# Patient Record
Sex: Female | Born: 1985
Health system: Southern US, Community
[De-identification: ages and names within clinical notes are randomized; demographics above are authoritative.]

## PROBLEM LIST (undated history)

## (undated) ENCOUNTER — Inpatient Hospital Stay (HOSPITAL_COMMUNITY): Payer: Self-pay

## (undated) ENCOUNTER — Inpatient Hospital Stay (HOSPITAL_COMMUNITY): Admission: RE | Payer: Self-pay | Source: Ambulatory Visit

## (undated) DIAGNOSIS — R51 Headache: Secondary | ICD-10-CM

## (undated) DIAGNOSIS — O24419 Gestational diabetes mellitus in pregnancy, unspecified control: Secondary | ICD-10-CM

## (undated) DIAGNOSIS — F419 Anxiety disorder, unspecified: Secondary | ICD-10-CM

---

## 2007-11-20 HISTORY — PX: TONSILLECTOMY: SUR1361

## 2012-08-18 ENCOUNTER — Emergency Department (HOSPITAL_COMMUNITY): Payer: Self-pay

## 2012-08-18 ENCOUNTER — Emergency Department (HOSPITAL_COMMUNITY)
Admission: EM | Admit: 2012-08-18 | Discharge: 2012-08-18 | Disposition: A | Discharge status: Home / Self Care | Payer: Self-pay | Attending: Emergency Medicine | Admitting: Emergency Medicine

## 2012-08-18 ENCOUNTER — Encounter (HOSPITAL_COMMUNITY): Payer: Self-pay | Admitting: *Deleted

## 2012-08-18 DIAGNOSIS — F411 Generalized anxiety disorder: Secondary | ICD-10-CM | POA: Insufficient documentation

## 2012-08-18 DIAGNOSIS — F410 Panic disorder [episodic paroxysmal anxiety] without agoraphobia: Secondary | ICD-10-CM

## 2012-08-18 LAB — POCT I-STAT, CHEM 8
BUN: 7 mg/dL (ref 6–23)
Calcium, Ion: 1.16 mmol/L (ref 1.12–1.23)
Creatinine, Ser: 0.7 mg/dL (ref 0.50–1.10)
Glucose, Bld: 95 mg/dL (ref 70–99)
Hemoglobin: 13.9 g/dL (ref 12.0–15.0)
Sodium: 140 mEq/L (ref 135–145)
TCO2: 25 mmol/L (ref 0–100)

## 2012-08-18 LAB — CBC
HCT: 39.7 % (ref 36.0–46.0)
MCH: 28.4 pg (ref 26.0–34.0)
MCHC: 33.8 g/dL (ref 30.0–36.0)
MCV: 84.1 fL (ref 78.0–100.0)
Platelets: 244 10*3/uL (ref 150–400)
RDW: 12.5 % (ref 11.5–15.5)
WBC: 6.1 10*3/uL (ref 4.0–10.5)

## 2012-08-18 MED ORDER — LORAZEPAM 1 MG PO TABS
0.5000 mg | ORAL_TABLET | Freq: Once | ORAL | Status: AC
  Administered 2012-08-18: 22:00:00 via ORAL
  Filled 2012-08-18: qty 1

## 2012-08-18 MED ORDER — LORAZEPAM 1 MG PO TABS: 0.5000 mg | ORAL_TABLET | Freq: Three times a day (TID) | ORAL | Status: DC | PRN

## 2012-08-18 NOTE — ED Notes (Signed)
New EKG given to Dr Effie Shy. No old EKG.

## 2012-08-18 NOTE — ED Provider Notes (Signed)
History     CSN: [id number]  Arrival date & time 08/18/12  2055   First MD Initiated Contact with Patient 08/18/12 2151      Chief Complaint  Patient presents with  . Chest Pain    (Consider location/radiation/quality/duration/timing/severity/associated sxs/prior treatment) HPI  pts first language is Arabic.  She is here with her husband and friend both who speak both languages and patient speaks some english.  Patients came to ER for chest pain. Husband said he came home and she was laying on the floor sobbing. She was also clutching her chest and saying that it hurt. The patient and husband admit that whenever she gets anxiety or emotionally upset she complaints of chest pains. She has no family history for cardiac disease or risk factors. She tells me that she calmed down after her husband came home and her pain and tightness went completely away on its own. The pain was like a squeezing of her chest cavity. She denies SOB. She admits to being very emotionally distressed at the time but is now feeling much better.  NAD/VSS  History reviewed. No pertinent past medical history.  Past Surgical History  Procedure Date  . Tonsillectomy     No family history on file.  History  Substance Use Topics  . Smoking status: Passive Smoke Exposure - Never Smoker -- 0.0 packs/day    Types: Cigarettes  . Smokeless tobacco: Not on file  . Alcohol Use: No    OB History    Grav Para Term Preterm Abortions TAB SAB Ect Mult Living                  Review of Systems  Review of Systems  Gen: no weight loss, fevers, chills, night sweats  Eyes: no discharge or drainage, no occular pain or visual changes  Nose: no epistaxis or rhinorrhea  Mouth: no dental pain, no sore throat  Neck: no neck pain  Lungs:No wheezing, coughing or hemoptysis CV: + chest pain, - palpitations, dependent edema or orthopnea  Abd: no abdominal pain, nausea, vomiting  GU: no dysuria or gross hematuria  MSK:   No abnormalities  Neuro: no headache, no focal neurologic deficits  Skin: no abnormalities Psyche: + anxiety and +  grief   Allergies  Review of patient's allergies indicates no known allergies.  Home Medications   Current Outpatient Rx  Name Route Sig Dispense Refill  . LORAZEPAM 1 MG PO TABS Oral Take 0.5 tablets (0.5 mg total) by mouth 3 (three) times daily as needed for anxiety. 3 tablet 0    BP 110/78  Pulse 79  Temp 97.8 F (36.6 C) (Oral)  Resp 16  Ht 5' 4.96" (1.65 m)  Wt 130 lb 1 oz (58.996 kg)  BMI 21.67 kg/m2  SpO2 100%  LMP 07/25/2012  Physical Exam  Nursing note and vitals reviewed. Constitutional: She appears well-developed and well-nourished. No distress.  HENT:  Head: Normocephalic and atraumatic.  Eyes: Pupils are equal, round, and reactive to light.  Neck: Normal range of motion. Neck supple.  Cardiovascular: Normal rate and regular rhythm.   Pulmonary/Chest: Effort normal.  Abdominal: Soft.  Neurological: She is alert.  Skin: Skin is warm and dry.    ED Course  Procedures (including critical care time)   Labs Reviewed  CBC  POCT I-STAT TROPONIN I  POCT I-STAT, CHEM 8   Dg Chest 2 View  08/18/2012  *RADIOLOGY REPORT*  Clinical Data: Chest pain  CHEST - 2 VIEW  Comparison: None.  Findings: Lungs are clear. No pleural effusion or pneumothorax. The cardiomediastinal contours are within normal limits. The visualized bones and soft tissues are without significant appreciable abnormality.  IMPRESSION: No radiographic evidence of acute cardiopulmonary process.   Original Report Authenticated By: Waneta Martins, M.D.      1. Anxiety attack       MDM  pts work-up negative. Pain very very unlikely to be cardiac and due to emotional distress. I discussed case with Dr. Ignacia Palma who feels the patient can go home as well.   Pt has been advised of the symptoms that warrant their return to the ED. Patient has voiced understanding and has agreed to  follow-up with the PCP or specialist.         Dorthula Matas, PA 08/18/12 2253

## 2012-08-18 NOTE — ED Provider Notes (Signed)
Date: 08/18/2012  Rate: 87  Rhythm: normal sinus rhythm and sinus arrhythmia  QRS Axis: right  Intervals: normal  ST/T Wave abnormalities: normal  Conduction Disutrbances:none  Narrative Interpretation: Borderline EKG  Old EKG Reviewed: none available      Carleene Cooper III, MD 08/18/12 2252

## 2012-08-18 NOTE — ED Notes (Signed)
Husband said he came home and found pt on floor holding her L chest saying that her chest hurt and feeling like her heart was beating fast. Pt's uncle passed away 3 days ago and she has been very sad about it since then. Per her husband, she has a hx of having chest pain when she gets upset.

## 2012-08-19 NOTE — ED Provider Notes (Signed)
Medical screening examination/treatment/procedure(s) were performed by non-physician practitioner and as supervising physician I was immediately available for consultation/collaboration.   Tiyona Desouza III, MD 08/19/12 1220 

## 2012-09-19 ENCOUNTER — Encounter (HOSPITAL_COMMUNITY): Payer: Self-pay | Admitting: *Deleted

## 2012-09-19 ENCOUNTER — Inpatient Hospital Stay (HOSPITAL_COMMUNITY)
Admission: AD | Admit: 2012-09-19 | Discharge: 2012-09-19 | Disposition: A | Discharge status: Home / Self Care | Payer: Medicaid Other | Source: Ambulatory Visit | Attending: Obstetrics & Gynecology | Admitting: Obstetrics & Gynecology

## 2012-09-19 DIAGNOSIS — Z3201 Encounter for pregnancy test, result positive: Secondary | ICD-10-CM

## 2012-09-19 DIAGNOSIS — R300 Dysuria: Secondary | ICD-10-CM | POA: Insufficient documentation

## 2012-09-19 DIAGNOSIS — N390 Urinary tract infection, site not specified: Secondary | ICD-10-CM | POA: Insufficient documentation

## 2012-09-19 DIAGNOSIS — M545 Low back pain, unspecified: Secondary | ICD-10-CM | POA: Insufficient documentation

## 2012-09-19 DIAGNOSIS — O2340 Unspecified infection of urinary tract in pregnancy, unspecified trimester: Secondary | ICD-10-CM

## 2012-09-19 DIAGNOSIS — O2390 Unspecified genitourinary tract infection in pregnancy, unspecified trimester: Secondary | ICD-10-CM | POA: Insufficient documentation

## 2012-09-19 HISTORY — DX: Anxiety disorder, unspecified: F41.9

## 2012-09-19 HISTORY — DX: Headache: R51

## 2012-09-19 LAB — URINE MICROSCOPIC-ADD ON

## 2012-09-19 LAB — URINALYSIS, ROUTINE W REFLEX MICROSCOPIC
Bilirubin Urine: NEGATIVE
Glucose, UA: NEGATIVE mg/dL
Ketones, ur: NEGATIVE mg/dL
Protein, ur: NEGATIVE mg/dL
Urobilinogen, UA: 0.2 mg/dL (ref 0.0–1.0)

## 2012-09-19 MED ORDER — NITROFURANTOIN MONOHYD MACRO 100 MG PO CAPS: 100.0000 mg | ORAL_CAPSULE | Freq: Two times a day (BID) | ORAL | Status: DC

## 2012-09-19 NOTE — MAU Note (Signed)
Pt states here for pregnancy test, had positive upt at home this week. Denies abd pain or bleeding. Does note headache and lower back pain x1week. Notes burning with voiding.Denies abnormal vaginal discharge or itching.

## 2012-09-19 NOTE — MAU Provider Note (Signed)
Chief Complaint: Pregnancy test    First Provider Initiated Contact with Patient 09/19/12 1116     SUBJECTIVE HPI: Katrina Blackwell is a 26 y.o. G1P0 at [redacted]w[redacted]d by LMP who presents to maternity admissions reporting low back pain and burning with urination.  She also reports a mild h/a and has frequent migraines.  She wants a pregnancy verification letter to apply for Medicaid and has questions about safe medications for her migraines in pregnancy.  She denies abdominal pain, vaginal bleeding, vaginal itching/burning,  dizziness, n/v, or fever/chills.     Past Medical History  Diagnosis Date  . Headache   . Anxiety    Past Surgical History  Procedure Date  . Tonsillectomy    History   Social History  . Marital Status: Married    Spouse Name: N/A    Number of Children: N/A  . Years of Education: N/A   Occupational History  . Not on file.   Social History Main Topics  . Smoking status: Passive Smoke Exposure - Never Smoker -- 0.0 packs/day    Types: Cigarettes  . Smokeless tobacco: Not on file  . Alcohol Use: No  . Drug Use: No  . Sexually Active:    Other Topics Concern  . Not on file   Social History Narrative  . No narrative on file   No current facility-administered medications on file prior to encounter.   No current outpatient prescriptions on file prior to encounter.   No Known Allergies  ROS: Pertinent items in HPI  OBJECTIVE Blood pressure 114/73, pulse 103, temperature 98.2 F (36.8 C), temperature source Oral, resp. rate 16, height 5\' 6"  (1.676 m), weight 58.741 kg (129 lb 8 oz), last menstrual period 08/20/2012. GENERAL: Well-developed, well-nourished female in no acute distress.  HEENT: Normocephalic HEART: normal rate RESP: normal effort ABDOMEN: Soft, non-tender EXTREMITIES: Nontender, no edema NEURO: Alert and oriented SPECULUM EXAM: Deferred  LAB RESULTS Results for orders placed during the hospital encounter of 09/19/12 (from the past 24 hour(s))   URINALYSIS, ROUTINE W REFLEX MICROSCOPIC     Status: Abnormal   Collection Time   09/19/12 10:44 AM      Component Value Range   Color, Urine YELLOW  YELLOW   APPearance CLEAR  CLEAR   Specific Gravity, Urine <1.005 (*) 1.005 - 1.030   pH 6.0  5.0 - 8.0   Glucose, UA NEGATIVE  NEGATIVE mg/dL   Hgb urine dipstick TRACE (*) NEGATIVE   Bilirubin Urine NEGATIVE  NEGATIVE   Ketones, ur NEGATIVE  NEGATIVE mg/dL   Protein, ur NEGATIVE  NEGATIVE mg/dL   Urobilinogen, UA 0.2  0.0 - 1.0 mg/dL   Nitrite NEGATIVE  NEGATIVE   Leukocytes, UA SMALL (*) NEGATIVE  URINE MICROSCOPIC-ADD ON     Status: Normal   Collection Time   09/19/12 10:44 AM      Component Value Range   Squamous Epithelial / LPF RARE  RARE   WBC, UA 7-10  <3 WBC/hpf   RBC / HPF 0-2  <3 RBC/hpf   Bacteria, UA RARE  RARE    IMAGING No results found.  ASSESSMENT 1. UTI in pregnancy   2. Positive pregnancy test     PLAN Discharge home Urine sent for culture Macrobid 100 mg BID x7 days Drink plenty of fluids, get enough sleep, and Tylenol for h/a Pregnancy verification letter given F/U with early prenatal care--pt given number for health department and [facility] clinic as she wants to start care as soon  as possible Return to MAU as needed    Medication List     As of 09/19/2012 11:23 AM    TAKE these medications         nitrofurantoin (macrocrystal-monohydrate) 100 MG capsule   Commonly known as: MACROBID   Take 1 capsule (100 mg total) by mouth 2 (two) times daily.           Follow-up Information    Follow up with [facility]. Schedule an appointment as soon as possible for a visit in 4 weeks. (Return to MAU as needed.)    Contact information:   [address] [place] [address] [phone]         Sharen Counter Certified Nurse-Midwife 09/19/2012  11:23 AM

## 2012-09-19 NOTE — MAU Provider Note (Signed)
Medical Screening exam and patient care preformed by advanced practice provider.  Agree with the above management.  

## 2012-09-20 LAB — URINE CULTURE: Colony Count: 15000

## 2012-10-17 ENCOUNTER — Encounter (HOSPITAL_COMMUNITY): Payer: Self-pay

## 2012-10-17 ENCOUNTER — Inpatient Hospital Stay (HOSPITAL_COMMUNITY)
Admission: AD | Admit: 2012-10-17 | Discharge: 2012-10-17 | Disposition: A | Discharge status: Home / Self Care | Payer: Medicaid Other | Source: Ambulatory Visit | Attending: Family Medicine | Admitting: Family Medicine

## 2012-10-17 ENCOUNTER — Inpatient Hospital Stay (HOSPITAL_COMMUNITY): Payer: Medicaid Other

## 2012-10-17 DIAGNOSIS — J029 Acute pharyngitis, unspecified: Secondary | ICD-10-CM | POA: Insufficient documentation

## 2012-10-17 DIAGNOSIS — O99891 Other specified diseases and conditions complicating pregnancy: Secondary | ICD-10-CM | POA: Insufficient documentation

## 2012-10-17 DIAGNOSIS — J069 Acute upper respiratory infection, unspecified: Secondary | ICD-10-CM | POA: Insufficient documentation

## 2012-10-17 DIAGNOSIS — R51 Headache: Secondary | ICD-10-CM | POA: Insufficient documentation

## 2012-10-17 LAB — WET PREP, GENITAL: Yeast Wet Prep HPF POC: NONE SEEN

## 2012-10-17 LAB — URINALYSIS, ROUTINE W REFLEX MICROSCOPIC
Bilirubin Urine: NEGATIVE
Hgb urine dipstick: NEGATIVE
Ketones, ur: 40 mg/dL — AB
Specific Gravity, Urine: 1.02 (ref 1.005–1.030)
Urobilinogen, UA: 0.2 mg/dL (ref 0.0–1.0)
pH: 6.5 (ref 5.0–8.0)

## 2012-10-17 MED ORDER — DM-GUAIFENESIN ER 30-600 MG PO TB12
2.0000 | ORAL_TABLET | Freq: Once | ORAL | Status: AC
  Administered 2012-10-17: 20:00:00 via ORAL
  Filled 2012-10-17: qty 2

## 2012-10-17 MED ORDER — ACETAMINOPHEN 325 MG PO TABS
650.0000 mg | ORAL_TABLET | Freq: Once | ORAL | Status: AC
  Administered 2012-10-17: 650 mg via ORAL
  Filled 2012-10-17: qty 2

## 2012-10-17 NOTE — MAU Note (Signed)
Sore throat, has felt feverish- started last night. No one else is sick.  Right ear hurts.  Has headache. Had vaginal bleeding 4 days with small clot, none since.

## 2012-10-17 NOTE — MAU Provider Note (Signed)
History     CSN: [id number]  Arrival date and time: 10/17/12 7846   First Provider Initiated Contact with Patient 10/17/12 1913      Chief Complaint  Patient presents with  . Sore Throat   HPI  Katrina Blackwell is a 26 y.o. G1P0  8w 2d  who presents today with fever, sore throat, and headache since yesterday. She has tried tylenol, but it has not helped.  She denies any close contacts who have been sick. She did not have a flu vaccine this year.   Also reports vaginal bleeding x 4 days.  Past Medical History  Diagnosis Date  . Headache   . Anxiety     Past Surgical History  Procedure Date  . Tonsillectomy     Family History  Problem Relation Age of Onset  . Other Neg Hx     History  Substance Use Topics  . Smoking status: Former Smoker -- 0.0 packs/day    Types: Cigarettes  . Smokeless tobacco: Not on file  . Alcohol Use: No    Allergies: No Known Allergies  Prescriptions prior to admission  Medication Sig Dispense Refill  . nitrofurantoin, macrocrystal-monohydrate, (MACROBID) 100 MG capsule Take 1 capsule (100 mg total) by mouth 2 (two) times daily.  14 capsule  1    Review of Systems  Constitutional: Positive for fever and chills.  Respiratory: Positive for cough.   Gastrointestinal: Negative for nausea, vomiting, abdominal pain, diarrhea and constipation.  Genitourinary: Negative for dysuria.  Musculoskeletal: Positive for myalgias.  Neurological: Positive for headaches.   Physical Exam   Blood pressure 110/76, pulse 99, temperature 98.4 F (36.9 C), temperature source Oral, resp. rate 20, height 5\' 4"  (1.626 m), weight 129 lb (58.514 kg), last menstrual period 08/20/2012, SpO2 100.00%.  Physical Exam  Nursing note and vitals reviewed. Constitutional: She is oriented to person, place, and time. She appears well-developed and well-nourished.  HENT:  Mouth/Throat: No oropharyngeal exudate.       Nares - red and boggy Pharynx - lightly red Tonsils not  enlarged. Ears - right TM not visualized as canal blocked with dark wax, left TM no redness, light reflex seen, full with fluid  Cardiovascular: Normal rate.   Respiratory: Effort normal and breath sounds normal.  GI: Soft.  Genitourinary:       Speculum exam: Vulva - negative Vagina - Small amount of creamy discharge, no odor, no bleeding Cervix - No contact bleeding Bimanual exam: Cervix closed Uterus non tender, 8 week size Adnexa non tender, no masses bilaterally GC/Chlam, wet prep done Chaperone present for exam.  Neurological: She is alert and oriented to person, place, and time.  Skin: Skin is warm and dry.    MAU Course  Procedures  1945: Ultrasound and Flu test pending. Care transferred to Night Shift NP 2000 Care assumed by [name], RN, FNP  Assessment and Plan  8w living IUP URI  Plan Take Tylenol 325 mg 2 tablets by mouth every 4 hours if needed for pain. Drink at least 8 8-oz glasses of water every day. Can use mucinex dm for nasal stuffiness Warm liquids to ease sore throat. Use Debrox drops by the package directions in your right ear to clear the wax which may be causing pain in that ear. Begin prenatal care as soon as possible - states they have list of providers to call. Flu swab pending.   Tawnya Crook 10/17/2012, 7:15 PM

## 2012-10-18 LAB — INFLUENZA PANEL BY PCR (TYPE A & B): Influenza A By PCR: NEGATIVE

## 2012-10-18 NOTE — MAU Provider Note (Signed)
Chart reviewed and agree with management and plan.  

## 2012-10-19 LAB — GC/CHLAMYDIA PROBE AMP
CT Probe RNA: NEGATIVE
GC Probe RNA: NEGATIVE

## 2012-11-15 ENCOUNTER — Encounter (HOSPITAL_COMMUNITY): Payer: Self-pay

## 2012-11-15 ENCOUNTER — Inpatient Hospital Stay (HOSPITAL_COMMUNITY)
Admission: AD | Admit: 2012-11-15 | Discharge: 2012-11-16 | Disposition: A | Discharge status: Home / Self Care | Payer: Medicaid Other | Source: Ambulatory Visit | Attending: Family Medicine | Admitting: Family Medicine

## 2012-11-15 DIAGNOSIS — O210 Mild hyperemesis gravidarum: Secondary | ICD-10-CM

## 2012-11-15 DIAGNOSIS — O211 Hyperemesis gravidarum with metabolic disturbance: Secondary | ICD-10-CM | POA: Insufficient documentation

## 2012-11-15 DIAGNOSIS — E860 Dehydration: Secondary | ICD-10-CM | POA: Insufficient documentation

## 2012-11-15 LAB — URINE MICROSCOPIC-ADD ON

## 2012-11-15 LAB — URINALYSIS, ROUTINE W REFLEX MICROSCOPIC
Bilirubin Urine: NEGATIVE
Glucose, UA: NEGATIVE mg/dL
Specific Gravity, Urine: >1.03 — ABNORMAL HIGH (ref 1.005–1.030)
Urobilinogen, UA: 0.2 mg/dL (ref 0.0–1.0)
pH: 6 (ref 5.0–8.0)

## 2012-11-15 NOTE — MAU Note (Signed)
Vomiting for 2 days all day. Vomited 6 times today. Can't keep down anything. No diarrhea

## 2012-11-16 DIAGNOSIS — E860 Dehydration: Secondary | ICD-10-CM

## 2012-11-16 DIAGNOSIS — O211 Hyperemesis gravidarum with metabolic disturbance: Secondary | ICD-10-CM

## 2012-11-16 LAB — COMPREHENSIVE METABOLIC PANEL
ALT: 11 U/L (ref 0–35)
Alkaline Phosphatase: 41 U/L (ref 39–117)
BUN: 8 mg/dL (ref 6–23)
CO2: 25 mEq/L (ref 19–32)
Chloride: 96 mEq/L (ref 96–112)
GFR calc Af Amer: >90 mL/min (ref >90)
Glucose, Bld: 75 mg/dL (ref 70–99)
Potassium: 3.9 mEq/L (ref 3.5–5.1)
Total Bilirubin: 0.3 mg/dL (ref 0.3–1.2)

## 2012-11-16 MED ORDER — PROMETHAZINE HCL 25 MG PO TABS: 25.0000 mg | ORAL_TABLET | Freq: Four times a day (QID) | ORAL | Status: DC | PRN

## 2012-11-16 MED ORDER — ONDANSETRON HCL 4 MG/2ML IJ SOLN
4.0000 mg | Freq: Four times a day (QID) | INTRAMUSCULAR | Status: DC | PRN
  Administered 2012-11-16: 4 mg via INTRAVENOUS
  Filled 2012-11-16: qty 2

## 2012-11-16 MED ORDER — ONDANSETRON HCL 4 MG PO TABS: 4.0000 mg | ORAL_TABLET | Freq: Three times a day (TID) | ORAL | Status: DC | PRN

## 2012-11-16 MED ORDER — PROMETHAZINE HCL 25 MG/ML IJ SOLN
25.0000 mg | Freq: Once | INTRAVENOUS | Status: AC
  Administered 2012-11-16: 25 mg via INTRAVENOUS
  Filled 2012-11-16: qty 1

## 2012-11-16 MED ORDER — PROMETHAZINE HCL 12.5 MG RE SUPP: 12.5000 mg | Freq: Four times a day (QID) | RECTAL | Status: DC | PRN

## 2012-11-16 NOTE — MAU Provider Note (Signed)
Chart reviewed and agree with management and plan.  

## 2012-11-16 NOTE — MAU Provider Note (Signed)
History     CSN: [id number]  Arrival date and time: 11/15/12 2332   None     Chief Complaint  Patient presents with  . Emesis During Pregnancy  . Dysuria   HPI 26 y.o. G1P0 at [redacted]w[redacted]d by 8 wk ultrasound with nausea and vomiting, worsening today. Cannot keep anything down including fluids. Was able to void here in MAU. No fever/chills, dysuria, flank pain, vaginal discharge, bleeding or cramping.   OB History    Grav Para Term Preterm Abortions TAB SAB Ect Mult Living   1               Past Medical History  Diagnosis Date  . Headache   . Anxiety     Past Surgical History  Procedure Date  . Tonsillectomy     Family History  Problem Relation Age of Onset  . Other Neg Hx     History  Substance Use Topics  . Smoking status: Former Smoker -- 0.0 packs/day    Types: Cigarettes  . Smokeless tobacco: Not on file  . Alcohol Use: No    Allergies: No Known Allergies  Prescriptions prior to admission  Medication Sig Dispense Refill  . acetaminophen (TYLENOL) 325 MG tablet Take 650 mg by mouth every 6 (six) hours as needed.      . nitrofurantoin, macrocrystal-monohydrate, (MACROBID) 100 MG capsule Take 1 capsule (100 mg total) by mouth 2 (two) times daily.  14 capsule  1    Review of Systems  Constitutional: Negative for fever and chills.  Eyes: Negative for blurred vision and double vision.  Respiratory: Negative for shortness of breath.   Gastrointestinal: Positive for nausea and vomiting. Negative for abdominal pain, diarrhea, constipation and blood in stool.  Genitourinary: Negative for dysuria, urgency and frequency.       Does report some pain/pressure when she voids.  Musculoskeletal: Positive for back pain (low back pain in AM on waking).  Neurological: Negative for dizziness and headaches.   Physical Exam   Blood pressure 117/76, pulse 97, temperature 97.8 F (36.6 C), resp. rate 20, height 5\' 6"  (1.676 m), weight 56.518 kg (124 lb 9.6 oz), last  menstrual period 08/20/2012.  Physical Exam  Constitutional: She is oriented to person, place, and time. She appears well-developed and well-nourished. No distress.  HENT:  Head: Normocephalic and atraumatic.       Mucous membranes dry  Eyes: Conjunctivae normal and EOM are normal.  Neck: Normal range of motion. Neck supple.  Cardiovascular: Normal rate, regular rhythm and normal heart sounds.   Respiratory: Effort normal and breath sounds normal. No respiratory distress.  GI: Soft. Bowel sounds are normal. She exhibits no distension. There is no tenderness. There is no rebound and no guarding.  Musculoskeletal: Normal range of motion. She exhibits no edema and no tenderness.  Neurological: She is alert and oriented to person, place, and time.  Skin: Skin is warm and dry.  Psychiatric: She has a normal mood and affect.    Results for orders placed during the hospital encounter of 11/15/12 (from the past 24 hour(s))  URINALYSIS, ROUTINE W REFLEX MICROSCOPIC     Status: Abnormal   Collection Time   11/15/12 11:38 PM      Component Value Range   Color, Urine YELLOW  YELLOW   APPearance CLEAR  CLEAR   Specific Gravity, Urine >1.030 (*) 1.005 - 1.030   pH 6.0  5.0 - 8.0   Glucose, UA NEGATIVE  NEGATIVE mg/dL  Hgb urine dipstick TRACE (*) NEGATIVE   Bilirubin Urine NEGATIVE  NEGATIVE   Ketones, ur >80 (*) NEGATIVE mg/dL   Protein, ur 30 (*) NEGATIVE mg/dL   Urobilinogen, UA 0.2  0.0 - 1.0 mg/dL   Nitrite NEGATIVE  NEGATIVE   Leukocytes, UA NEGATIVE  NEGATIVE  URINE MICROSCOPIC-ADD ON     Status: Abnormal   Collection Time   11/15/12 11:38 PM      Component Value Range   Squamous Epithelial / LPF MANY (*) RARE   WBC, UA 3-6  <3 WBC/hpf   Bacteria, UA MANY (*) RARE   Urine-Other MUCOUS PRESENT    COMPREHENSIVE METABOLIC PANEL     Status: Abnormal   Collection Time   11/16/12 12:35 AM      Component Value Range   Sodium 134 (*) 135 - 145 mEq/L   Potassium 3.9  3.5 - 5.1 mEq/L     Chloride 96  96 - 112 mEq/L   CO2 25  19 - 32 mEq/L   Glucose, Bld 75  70 - 99 mg/dL   BUN 8  6 - 23 mg/dL   Creatinine, Ser 1.61 (*) 0.50 - 1.10 mg/dL   Calcium 09.6  8.4 - 04.5 mg/dL   Total Protein 7.7  6.0 - 8.3 g/dL   Albumin 3.5  3.5 - 5.2 g/dL   AST 21  0 - 37 U/L   ALT 11  0 - 35 U/L   Alkaline Phosphatase 41  39 - 117 U/L   Total Bilirubin 0.3  0.3 - 1.2 mg/dL   GFR calc non Af Amer >90  >90 mL/min   GFR calc Af Amer >90  >90 mL/min    MAU Course  Procedures  1 liter LR 25 mg phenergan 4 mg zofran   Assessment and Plan  26 y.o. G1P0 at [redacted]w[redacted]d with nausea/vomiting, dehydration - Improved with fluids, phenergan and zofran - Home with phenergan and zofran. - Establish care as soon as possible.  Napoleon Form 11/16/2012, 2:43 AM

## 2012-11-17 ENCOUNTER — Telehealth: Payer: Self-pay | Admitting: *Deleted

## 2012-11-17 LAB — URINE CULTURE

## 2012-11-17 MED ORDER — CEPHALEXIN 500 MG PO CAPS: 500.0000 mg | ORAL_CAPSULE | Freq: Four times a day (QID) | ORAL | Status: DC

## 2012-11-17 NOTE — Telephone Encounter (Signed)
Called pt and left message to return our call for important information regarding test results.  Pt needs to be informed of UTI, need for Rx (has been sent to pharmacy) and also needs Ob urine culture repeated after 11/22/12. Inquire w/pt as to where she will be getting prenatal care- (no appt currently scheduled in our clinic.) if pt will be going to another facility, notify them of need for repeat urine culture.

## 2012-11-17 NOTE — Telephone Encounter (Signed)
Message copied by Jill Side on Mon Nov 17, 2012 11:57 AM ------      Message from: Reva Bores      Created: Mon Nov 17, 2012 11:51 AM       Looks like it's resistant to all po medication safe in pregnancy--I think we try Keflex and re-culture in a week.

## 2012-11-19 NOTE — L&D Delivery Note (Signed)
Delivery Note At 12:50 PM a viable female, "Katrina Blackwell",  was delivered via Vaginal, Spontaneous Delivery (Presentation: Left Occiput Posterior).  APGAR: 3, 7; weight .   Placenta status: Intact, Spontaneous.  Cord: 3 vessels with the following complications: None.  CAN x 2, reduced over vtx at delivery.  Cord pH: Pending NICU team called just after delivery due to depression at delivery, but baby responded quickly to stimulation and drying.  O2 sat monitor applied, 100% without supplemental oxygen.  Anesthesia: None  Episiotomy: None Lacerations: 1st degree;Periurethral (bilateral), 1st degree periclitoral--repaired with red robinson catheter in place for urethral localization. Suture Repair: 3.0 vicryl Est. Blood Loss (mL): 200 cc  Mom to postpartum.  Baby to skin to skin. Family will plan outpatient circumcision.  Nigel Bridgeman 05/12/2013, 2:33 PM

## 2012-11-21 NOTE — Telephone Encounter (Signed)
Called patient with Katrina Blackwell [phone] and left a message we are calling with some important information- please call clinic Monday.

## 2012-11-25 ENCOUNTER — Telehealth: Payer: Self-pay | Admitting: *Deleted

## 2012-11-25 NOTE — Telephone Encounter (Signed)
Message left by a female who requested call back regarding pt.

## 2012-11-26 NOTE — Telephone Encounter (Signed)
Called pt with [employer] interpreter # [phone] and I informed her husband that there is an Rx for her if she could go pick it up at the [facility] on [address].  Pt husband stated "ok, I will pick it up".

## 2012-11-26 NOTE — Telephone Encounter (Signed)
Day, Drucilla Schmidt, RN 11/25/2012 9:26 AM Signed  Message left by a female who requested call back regarding pt.

## 2012-12-25 ENCOUNTER — Inpatient Hospital Stay (HOSPITAL_COMMUNITY)
Admission: AD | Admit: 2012-12-25 | Discharge: 2012-12-26 | Disposition: A | Discharge status: Home / Self Care | Payer: Medicaid Other | Source: Ambulatory Visit | Attending: Obstetrics & Gynecology | Admitting: Obstetrics & Gynecology

## 2012-12-25 ENCOUNTER — Encounter (HOSPITAL_COMMUNITY): Payer: Self-pay | Admitting: *Deleted

## 2012-12-25 DIAGNOSIS — O2340 Unspecified infection of urinary tract in pregnancy, unspecified trimester: Secondary | ICD-10-CM

## 2012-12-25 DIAGNOSIS — N390 Urinary tract infection, site not specified: Secondary | ICD-10-CM | POA: Insufficient documentation

## 2012-12-25 DIAGNOSIS — R509 Fever, unspecified: Secondary | ICD-10-CM | POA: Insufficient documentation

## 2012-12-25 DIAGNOSIS — R109 Unspecified abdominal pain: Secondary | ICD-10-CM | POA: Insufficient documentation

## 2012-12-25 DIAGNOSIS — O99891 Other specified diseases and conditions complicating pregnancy: Secondary | ICD-10-CM | POA: Insufficient documentation

## 2012-12-25 DIAGNOSIS — J111 Influenza due to unidentified influenza virus with other respiratory manifestations: Secondary | ICD-10-CM | POA: Insufficient documentation

## 2012-12-25 DIAGNOSIS — O2390 Unspecified genitourinary tract infection in pregnancy, unspecified trimester: Secondary | ICD-10-CM | POA: Insufficient documentation

## 2012-12-25 LAB — URINALYSIS, ROUTINE W REFLEX MICROSCOPIC
Bilirubin Urine: NEGATIVE
Glucose, UA: NEGATIVE mg/dL
Ketones, ur: NEGATIVE mg/dL
Leukocytes, UA: NEGATIVE
Nitrite: POSITIVE — AB
Protein, ur: NEGATIVE mg/dL

## 2012-12-25 LAB — URINE MICROSCOPIC-ADD ON

## 2012-12-25 LAB — CBC WITH DIFFERENTIAL/PLATELET
Basophils Absolute: 0 10*3/uL (ref 0.0–0.1)
Basophils Relative: 0 % (ref 0–1)
Eosinophils Absolute: 0.1 10*3/uL (ref 0.0–0.7)
Eosinophils Relative: 1 % (ref 0–5)
HCT: 33.5 % — ABNORMAL LOW (ref 36.0–46.0)
MCH: 28.6 pg (ref 26.0–34.0)
MCHC: 34 g/dL (ref 30.0–36.0)
Monocytes Absolute: 0.9 10*3/uL (ref 0.1–1.0)
Monocytes Relative: 12 % (ref 3–12)
Neutro Abs: 5.3 10*3/uL (ref 1.7–7.7)
RDW: 13.6 % (ref 11.5–15.5)

## 2012-12-25 LAB — COMPREHENSIVE METABOLIC PANEL
AST: 18 U/L (ref 0–37)
Albumin: 2.8 g/dL — ABNORMAL LOW (ref 3.5–5.2)
BUN: 7 mg/dL (ref 6–23)
Calcium: 9.1 mg/dL (ref 8.4–10.5)
Chloride: 98 mEq/L (ref 96–112)
Creatinine, Ser: 0.48 mg/dL — ABNORMAL LOW (ref 0.50–1.10)
Total Protein: 6.6 g/dL (ref 6.0–8.3)

## 2012-12-25 MED ORDER — GUAIFENESIN-CODEINE 100-10 MG/5ML PO SYRP: 5.0000 mL | ORAL_SOLUTION | Freq: Three times a day (TID) | ORAL | Status: DC | PRN

## 2012-12-25 MED ORDER — CEPHALEXIN 500 MG PO CAPS: 500.0000 mg | ORAL_CAPSULE | Freq: Four times a day (QID) | ORAL | Status: DC

## 2012-12-25 MED ORDER — OSELTAMIVIR PHOSPHATE 75 MG PO CAPS: 75.0000 mg | ORAL_CAPSULE | Freq: Two times a day (BID) | ORAL | Status: DC

## 2012-12-25 NOTE — MAU Note (Addendum)
PT SAYS SHE GOT  DEVELOPED SORE THROAT AND FEVER LAST NIGHT --  AND  ABD HURTS.   VOMITS  EVERY MORNING  .  SAUS LEGS HURT AND  BACK HURTS.     NO DR - WAITING FOR MEDICAID CARD.  .  WAS HERE  IN January.   TOOK TYLENOL AT  0600.

## 2012-12-25 NOTE — MAU Provider Note (Signed)
History     CSN: [id number]  Arrival date and time: 12/25/12 1930   None     No chief complaint on file.  HPI Katrina Blackwell is a 27 y.o. female @ [redacted]w[redacted]d gestation who presents to MAU with flu like symptoms. The symptoms started yesterday. Symptoms include fever, chills, aching all over, cough and congestion, lower abdominal pian and UTI symptoms.  No one else at home has been sick.  [facility] with [facility]. The history was provided by the patient.   OB History    Grav Para Term Preterm Abortions TAB SAB Ect Mult Living   1               Past Medical History  Diagnosis Date  . Headache   . Anxiety     Past Surgical History  Procedure Date  . Tonsillectomy     Family History  Problem Relation Age of Onset  . Other Neg Hx     History  Substance Use Topics  . Smoking status: Former Smoker -- 0.0 packs/day    Types: Cigarettes  . Smokeless tobacco: Not on file  . Alcohol Use: No    Allergies: No Known Allergies  Prescriptions prior to admission  Medication Sig Dispense Refill  . acetaminophen (TYLENOL) 325 MG tablet Take 650 mg by mouth every 6 (six) hours as needed. pain      . ondansetron (ZOFRAN) 4 MG tablet Take 1 tablet (4 mg total) by mouth every 8 (eight) hours as needed for nausea.  20 tablet  0    Review of Systems  Constitutional: Positive for fever, chills and malaise/fatigue. Negative for weight loss.  HENT: Negative for ear pain, nosebleeds, congestion, sore throat and neck pain.   Eyes: Negative for blurred vision, double vision, photophobia and pain.  Respiratory: Positive for cough. Negative for shortness of breath and wheezing.   Cardiovascular: Negative for chest pain, palpitations and leg swelling.  Gastrointestinal: Positive for abdominal pain. Negative for heartburn, nausea, vomiting, diarrhea and constipation.  Genitourinary: Positive for dysuria, urgency and frequency.  Musculoskeletal: Positive for back pain. Negative for myalgias.   Skin: Negative for itching and rash.  Neurological: Positive for headaches. Negative for dizziness, sensory change, speech change, seizures and weakness.  Endo/Heme/Allergies: Does not bruise/bleed easily.  Psychiatric/Behavioral: Negative for depression and substance abuse. The patient is not nervous/anxious and does not have insomnia.    Blood pressure 95/59, pulse 111, temperature 99 F (37.2 C), temperature source Oral, resp. rate 20, height 5\' 4"  (1.626 m), weight 132 lb (59.875 kg), last menstrual period 08/20/2012.  Physical Exam  Nursing note and vitals reviewed. Constitutional: She is oriented to person, place, and time. She appears well-developed and well-nourished. No distress.  HENT:  Head: Normocephalic and atraumatic.  Eyes: EOM are normal.  Neck: Neck supple.  Cardiovascular:       tachycardia  Respiratory: Effort normal. No respiratory distress. She has no wheezes. She has no rales.       Prolonged expirations  GI: Soft. Bowel sounds are normal. There is generalized tenderness. There is no rebound, no guarding and no CVA tenderness.       Positive FHT's  Musculoskeletal: Normal range of motion.  Neurological: She is alert and oriented to person, place, and time.  Skin: Skin is warm and dry.  Psychiatric: She has a normal mood and affect. Her behavior is normal. Judgment and thought content normal.   Results for orders placed during the hospital encounter  of 12/25/12 (from the past 24 hour(s))  URINALYSIS, ROUTINE W REFLEX MICROSCOPIC     Status: Abnormal   Collection Time   12/25/12  7:49 PM      Component Value Range   Color, Urine YELLOW  YELLOW   APPearance CLEAR  CLEAR   Specific Gravity, Urine 1.025  1.005 - 1.030   pH 7.0  5.0 - 8.0   Glucose, UA NEGATIVE  NEGATIVE mg/dL   Hgb urine dipstick NEGATIVE  NEGATIVE   Bilirubin Urine NEGATIVE  NEGATIVE   Ketones, ur NEGATIVE  NEGATIVE mg/dL   Protein, ur NEGATIVE  NEGATIVE mg/dL   Urobilinogen, UA 0.2  0.0 -  1.0 mg/dL   Nitrite POSITIVE (*) NEGATIVE   Leukocytes, UA NEGATIVE  NEGATIVE  URINE MICROSCOPIC-ADD ON     Status: Abnormal   Collection Time   12/25/12  7:49 PM      Component Value Range   Squamous Epithelial / LPF FEW (*) RARE   WBC, UA 3-6  <3 WBC/hpf   Bacteria, UA MANY (*) RARE   Urine-Other MUCOUS PRESENT    CBC WITH DIFFERENTIAL     Status: Abnormal   Collection Time   12/25/12  9:50 PM      Component Value Range   WBC 7.7  4.0 - 10.5 K/uL   RBC 3.98  3.87 - 5.11 MIL/uL   Hemoglobin 11.4 (*) 12.0 - 15.0 g/dL   HCT 56.2 (*) 13.0 - 86.5 %   MCV 84.2  78.0 - 100.0 fL   MCH 28.6  26.0 - 34.0 pg   MCHC 34.0  30.0 - 36.0 g/dL   RDW 78.4  69.6 - 29.5 %   Platelets 209  150 - 400 K/uL   Neutrophils Relative 69  43 - 77 %   Neutro Abs 5.3  1.7 - 7.7 K/uL   Lymphocytes Relative 18  12 - 46 %   Lymphs Abs 1.4  0.7 - 4.0 K/uL   Monocytes Relative 12  3 - 12 %   Monocytes Absolute 0.9  0.1 - 1.0 K/uL   Eosinophils Relative 1  0 - 5 %   Eosinophils Absolute 0.1  0.0 - 0.7 K/uL   Basophils Relative 0  0 - 1 %   Basophils Absolute 0.0  0.0 - 0.1 K/uL  COMPREHENSIVE METABOLIC PANEL     Status: Abnormal   Collection Time   12/25/12  9:50 PM      Component Value Range   Sodium 134 (*) 135 - 145 mEq/L   Potassium 3.4 (*) 3.5 - 5.1 mEq/L   Chloride 98  96 - 112 mEq/L   CO2 25  19 - 32 mEq/L   Glucose, Bld 97  70 - 99 mg/dL   BUN 7  6 - 23 mg/dL   Creatinine, Ser 2.84 (*) 0.50 - 1.10 mg/dL   Calcium 9.1  8.4 - 13.2 mg/dL   Total Protein 6.6  6.0 - 8.3 g/dL   Albumin 2.8 (*) 3.5 - 5.2 g/dL   AST 18  0 - 37 U/L   ALT 13  0 - 35 U/L   Alkaline Phosphatase 50  39 - 117 U/L   Total Bilirubin 0.2 (*) 0.3 - 1.2 mg/dL   GFR calc non Af Amer >90  >90 mL/min   GFR calc Af Amer >90  >90 mL/min    Discussed with Dr. Debroah Loop and does not think admission required at this time.  Assessment: 27 y.o. female @  [redacted]w[redacted]d with flu like symptoms   Influenza   UTI in  pregnancy  Plan:  Tamiflu   Keflex   Urine culture   Robitussin AC   Return if symptoms worsen   Discussed with the patient and all questioned fully answered. She will call me if any problems arise.    Medication List     As of 12/26/2012  1:24 AM    START taking these medications         cephALEXin 500 MG capsule   Commonly known as: KEFLEX   Take 1 capsule (500 mg total) by mouth 4 (four) times daily.      guaiFENesin-codeine 100-10 MG/5ML syrup   Commonly known as: ROBITUSSIN AC   Take 5 mLs by mouth 3 (three) times daily as needed for cough.      oseltamivir 75 MG capsule   Commonly known as: TAMIFLU   Take 1 capsule (75 mg total) by mouth every 12 (twelve) hours.      CONTINUE taking these medications         acetaminophen 325 MG tablet   Commonly known as: TYLENOL      ondansetron 4 MG tablet   Commonly known as: ZOFRAN   Take 1 tablet (4 mg total) by mouth every 8 (eight) hours as needed for nausea.          Where to get your medications    These are the prescriptions that you need to pick up.   You may get these medications from any pharmacy.         cephALEXin 500 MG capsule   guaiFENesin-codeine 100-10 MG/5ML syrup   oseltamivir 75 MG capsule            Procedures  Cumi Sanagustin, RN, FNP, BC 12/25/2012, 9:39 PM

## 2012-12-27 LAB — URINE CULTURE

## 2013-01-07 ENCOUNTER — Ambulatory Visit: Payer: Medicaid Other | Admitting: Certified Nurse Midwife

## 2013-01-07 ENCOUNTER — Ambulatory Visit: Payer: Medicaid Other | Admitting: Obstetrics and Gynecology

## 2013-01-07 ENCOUNTER — Telehealth: Payer: Self-pay | Admitting: Obstetrics and Gynecology

## 2013-01-07 ENCOUNTER — Other Ambulatory Visit: Payer: Self-pay | Admitting: Obstetrics and Gynecology

## 2013-01-07 DIAGNOSIS — O2340 Unspecified infection of urinary tract in pregnancy, unspecified trimester: Secondary | ICD-10-CM

## 2013-01-07 LAB — CBC WITH DIFFERENTIAL/PLATELET
Eosinophils Absolute: 0.1 10*3/uL (ref 0.0–0.7)
Lymphocytes Relative: 25 % (ref 12–46)
Lymphs Abs: 2.3 10*3/uL (ref 0.7–4.0)
MCH: 28.3 pg (ref 26.0–34.0)
Neutro Abs: 5.9 10*3/uL (ref 1.7–7.7)
Neutrophils Relative %: 64 % (ref 43–77)
Platelets: 230 10*3/uL (ref 150–400)
RBC: 4.03 MIL/uL (ref 3.87–5.11)
WBC: 9.1 10*3/uL (ref 4.0–10.5)

## 2013-01-07 MED ORDER — CEPHALEXIN 500 MG PO CAPS: 500.0000 mg | ORAL_CAPSULE | Freq: Two times a day (BID) | ORAL | Status: DC

## 2013-01-07 NOTE — Progress Notes (Signed)
[redacted]w[redacted]d Katrina Blackwell is a 27 y.o. female, G1P0 at [redacted]w[redacted]d, presenting today for NOB interview.  Pt c/o occasional "pain" with urination for last 5 months.  Denies fever.  Pt states she completed full regimen on abx. rx'd on 2/6.  Recent MAUV: 11/15/13   Value   Specimen Description URINE, CLEAN CATCH    Special Requests NONE    Culture Setup Time 11/16/2012 11:05    Colony Count >=100,000 COLONIES/ML    Culture ESCHERICHIA COLI Note: Confirmed Extended Spectrum Beta-Lactamase Producer (ESBL)   Report Status 11/17/2012 FINAL    Organism ID, Bacteria ESCHERICHIA COLI    Resulting Agency [facility]   Culture & Susceptibility    Antibiotic  Organism Organism Organism     ESCHERICHIA COLI     AMPICILLIN  >=32 R Final       CEFAZOLIN  >=64 R Final       CEFTRIAXONE  >=64 R Final       CIPROFLOXACIN  <=0.25 S Final       GENTAMICIN  >=16 R Final       IMIPENEM  <=0.25 S Final       LEVOFLOXACIN  0.25 S Final       NITROFURANTOIN  256 R Final       PIP/TAZO  <=4 S Final       TOBRAMYCIN  2 S Final       TRIMETH/SULFA  >=320 R Final                  12/25/12 - Keflex was rx'd after this culture   Value   Specimen Description URINE, CLEAN CATCH    Special Requests NONE    Culture Setup Time 12/26/2012 05:59    Colony Count >=100,000 COLONIES/ML    Culture ESCHERICHIA COLI Note: Confirmed Extended Spectrum Beta-Lactamase Producer (ESBL)   Report Status 12/27/2012 FINAL    Organism ID, Bacteria ESCHERICHIA COLI    Resulting Agency [facility]   Culture & Susceptibility    Antibiotic  Organism       ESCHERICHIA COLI     AMPICILLIN  >=32 R Final       CEFAZOLIN  >=64 R Final       CEFTRIAXONE  RESISTANT Final       CIPROFLOXACIN  <=0.25 S Final       GENTAMICIN  <=1 S Final       IMIPENEM  <=0.25 S Final       LEVOFLOXACIN  <=0.12 S Final       NITROFURANTOIN  256 R Final       PIP/TAZO  <=4 S Final       TOBRAMYCIN  <=1 S Final       TRIMETH/SULFA  >=320 R Final        ROS:   See HPI  + CVAT (more prominent on right side per pt) Afebrile   Assessment/Plan: IUP at [redacted]w[redacted]d UTI in pregnancy with multiple resistance   Plan: Per consultation with Dr. Stefano Gaul Stat CBC If WBC are NOT elevated, rx Keflex and re-cullture in 1 week If WBC are elevated, refer to ID   Rowan Blase, MSN 01/07/2013, 12:46 PM

## 2013-01-07 NOTE — Telephone Encounter (Signed)
TC to Pt. LM on female's VM for pt to obtain RX and begin taking tonight. To call after hours tonight or office on AM with any questions.

## 2013-01-07 NOTE — Progress Notes (Signed)
NOB interview.  Pt is from [place]. States is sometimes having dysuria and back pain.  Urine chem 10 results and previous urine culture results reviewed by Alvino Chapel, CNM who will evaluate today.  T-98.8  NOB W/u with CA after anatomy U/S 01/15/13

## 2013-01-08 ENCOUNTER — Telehealth: Payer: Self-pay | Admitting: Obstetrics and Gynecology

## 2013-01-08 LAB — AFP, QUAD SCREEN
AFP: 91.3 IU/mL
Curr Gest Age: 20.3 wks.days
Interpretation-AFP: NEGATIVE
MoM for AFP: 1.49
Trisomy 18 (Edward) Syndrome Interp.: 1:124000 {titer}
uE3 Mom: 0.9
uE3 Value: 1.2 ng/mL

## 2013-01-08 LAB — GC/CHLAMYDIA PROBE AMP, URINE: GC Probe Amp, Urine: NEGATIVE

## 2013-01-08 LAB — VARICELLA ZOSTER ANTIBODY, IGG: Varicella IgG: 155 Index — ABNORMAL HIGH (ref <135.00)

## 2013-01-08 NOTE — Telephone Encounter (Signed)
TC to pt. LM on make VM of female to call back to verify received msg to obtain RX.

## 2013-01-09 ENCOUNTER — Telehealth: Payer: Self-pay | Admitting: Obstetrics and Gynecology

## 2013-01-09 ENCOUNTER — Other Ambulatory Visit: Payer: Self-pay

## 2013-01-09 LAB — PRENATAL PANEL VII
Antibody Screen: NEGATIVE
Hepatitis B Surface Ag: NEGATIVE
Rubella: 4.48 Index — ABNORMAL HIGH (ref <0.90)

## 2013-01-09 LAB — VARICELLA ZOSTER ANTIBODY, IGM: Varicella Zoster Ab IgM: 0.19 {ISR} (ref <0.91)

## 2013-01-09 NOTE — Telephone Encounter (Signed)
TC to pt's husband.States received message. Started Rx today. To call with any concerns.

## 2013-01-10 LAB — CULTURE, OB URINE

## 2013-01-13 LAB — HEMOGLOBINOPATHY EVALUATION
Hemoglobin Other: 0 %
Hgb A: 96.3 % — ABNORMAL LOW (ref 96.8–97.8)
Hgb S Quant: 0 %

## 2013-01-15 ENCOUNTER — Ambulatory Visit: Payer: Medicaid Other | Admitting: Certified Nurse Midwife

## 2013-01-15 ENCOUNTER — Encounter: Payer: Self-pay | Admitting: Certified Nurse Midwife

## 2013-01-15 ENCOUNTER — Ambulatory Visit: Payer: Medicaid Other

## 2013-01-15 VITALS — BP 110/72 | Wt 136.0 lb

## 2013-01-15 LAB — POCT WET PREP (WET MOUNT): WBC, Wet Prep HPF POC: NEGATIVE

## 2013-01-15 LAB — US OB COMP + 14 WK

## 2013-01-15 LAB — POCT URINALYSIS DIPSTICK
Ketones, UA: NEGATIVE
Nitrite, UA: NEGATIVE
Spec Grav, UA: 1.025
pH, UA: 5

## 2013-01-15 NOTE — Progress Notes (Signed)
New ob workup  Last pap never had one LMP 08-20-2012 C/O low back pain

## 2013-01-16 ENCOUNTER — Encounter: Payer: Self-pay | Admitting: Certified Nurse Midwife

## 2013-01-16 LAB — PAP IG W/ RFLX HPV ASCU

## 2013-01-17 LAB — URINE CULTURE: Organism ID, Bacteria: NO GROWTH

## 2013-01-19 ENCOUNTER — Encounter: Payer: Medicaid Other | Admitting: Certified Nurse Midwife

## 2013-02-08 NOTE — Progress Notes (Signed)
  Subjective:    Patient ID: Katrina Blackwell, female    DOB: 16-Oct-1986, 27 y.o.   MRN: [id number]  HPI    Review of Systems     Objective:   Physical Exam  Constitutional: She appears well-developed.  HENT:  Head: Normocephalic.  Neck: No thyromegaly present.  Cardiovascular: Regular rhythm.   Pulmonary/Chest: Breath sounds normal.  Abdominal: She exhibits no distension. There is no tenderness. There is no rebound and no guarding.  Musculoskeletal: She exhibits no edema.  Neurological: She is alert.  Skin: Skin is warm and dry.  Psychiatric: She has a normal mood and affect.          Assessment & Plan:  UC, pap, wet mount - nl completed keflex for UTI    Plan: prevention UTI, Call if worsens or no improvement. Follow up as scheduled.

## 2013-02-12 ENCOUNTER — Other Ambulatory Visit: Payer: Self-pay | Admitting: Obstetrics and Gynecology

## 2013-02-14 LAB — URINE CULTURE

## 2013-05-07 LAB — OB RESULTS CONSOLE GC/CHLAMYDIA
Chlamydia: NEGATIVE
Gonorrhea: NEGATIVE

## 2013-05-11 ENCOUNTER — Inpatient Hospital Stay (HOSPITAL_COMMUNITY)
Admission: AD | Admit: 2013-05-11 | Discharge: 2013-05-14 | DRG: 775 | Disposition: A | Discharge status: Home / Self Care | Payer: Medicaid Other | Source: Ambulatory Visit | Attending: Obstetrics and Gynecology | Admitting: Obstetrics and Gynecology

## 2013-05-11 ENCOUNTER — Encounter (HOSPITAL_COMMUNITY): Payer: Self-pay | Admitting: *Deleted

## 2013-05-11 ENCOUNTER — Inpatient Hospital Stay (HOSPITAL_COMMUNITY)
Admission: AD | Admit: 2013-05-11 | Discharge: 2013-05-11 | Disposition: A | Discharge status: Home / Self Care | Payer: Medicaid Other | Source: Ambulatory Visit | Attending: Obstetrics and Gynecology | Admitting: Obstetrics and Gynecology

## 2013-05-11 DIAGNOSIS — O99820 Streptococcus B carrier state complicating pregnancy: Secondary | ICD-10-CM

## 2013-05-11 DIAGNOSIS — O479 False labor, unspecified: Secondary | ICD-10-CM | POA: Diagnosis present

## 2013-05-11 DIAGNOSIS — G43909 Migraine, unspecified, not intractable, without status migrainosus: Secondary | ICD-10-CM

## 2013-05-11 DIAGNOSIS — Z22330 Carrier of Group B streptococcus: Secondary | ICD-10-CM

## 2013-05-11 DIAGNOSIS — O99892 Other specified diseases and conditions complicating childbirth: Secondary | ICD-10-CM | POA: Diagnosis present

## 2013-05-11 DIAGNOSIS — O2340 Unspecified infection of urinary tract in pregnancy, unspecified trimester: Secondary | ICD-10-CM | POA: Diagnosis present

## 2013-05-11 DIAGNOSIS — Z603 Acculturation difficulty: Secondary | ICD-10-CM

## 2013-05-11 DIAGNOSIS — O99891 Other specified diseases and conditions complicating pregnancy: Secondary | ICD-10-CM | POA: Insufficient documentation

## 2013-05-11 DIAGNOSIS — IMO0001 Reserved for inherently not codable concepts without codable children: Secondary | ICD-10-CM

## 2013-05-11 DIAGNOSIS — Z758 Other problems related to medical facilities and other health care: Secondary | ICD-10-CM

## 2013-05-11 DIAGNOSIS — F411 Generalized anxiety disorder: Secondary | ICD-10-CM

## 2013-05-11 DIAGNOSIS — Z789 Other specified health status: Secondary | ICD-10-CM

## 2013-05-11 HISTORY — DX: Migraine, unspecified, not intractable, without status migrainosus: G43.909

## 2013-05-11 HISTORY — DX: Generalized anxiety disorder: F41.1

## 2013-05-11 LAB — CBC
MCH: 29.4 pg (ref 26.0–34.0)
MCHC: 33.6 g/dL (ref 30.0–36.0)
Platelets: 168 10*3/uL (ref 150–400)
Platelets: 181 10*3/uL (ref 150–400)
RBC: 4.12 MIL/uL (ref 3.87–5.11)
RDW: 13.7 % (ref 11.5–15.5)
WBC: 8 10*3/uL (ref 4.0–10.5)

## 2013-05-11 LAB — RPR: RPR Ser Ql: NONREACTIVE

## 2013-05-11 LAB — TYPE AND SCREEN: Antibody Screen: NEGATIVE

## 2013-05-11 LAB — OB RESULTS CONSOLE GBS: GBS: POSITIVE

## 2013-05-11 MED ORDER — PHENYLEPHRINE 40 MCG/ML (10ML) SYRINGE FOR IV PUSH (FOR BLOOD PRESSURE SUPPORT)
80.0000 ug | PREFILLED_SYRINGE | INTRAVENOUS | Status: DC | PRN
  Filled 2013-05-11: qty 5
  Filled 2013-05-11: qty 2
  Filled 2013-05-11: qty 5

## 2013-05-11 MED ORDER — OXYTOCIN BOLUS FROM INFUSION
500.0000 mL | INTRAVENOUS | Status: DC
  Administered 2013-05-12: 500 mL via INTRAVENOUS

## 2013-05-11 MED ORDER — LACTATED RINGERS IV SOLN: 500.0000 mL | INTRAVENOUS | Status: DC | PRN

## 2013-05-11 MED ORDER — PENICILLIN G POTASSIUM 5000000 UNITS IJ SOLR
2.5000 10*6.[IU] | INTRAMUSCULAR | Status: DC
  Administered 2013-05-12 (×3): 2.5 10*6.[IU] via INTRAVENOUS
  Filled 2013-05-11 (×10): qty 2.5

## 2013-05-11 MED ORDER — CITRIC ACID-SODIUM CITRATE 334-500 MG/5ML PO SOLN: 30.0000 mL | ORAL | Status: DC | PRN

## 2013-05-11 MED ORDER — OXYCODONE-ACETAMINOPHEN 5-325 MG PO TABS: 1.0000 | ORAL_TABLET | ORAL | Status: DC | PRN

## 2013-05-11 MED ORDER — EPHEDRINE 5 MG/ML INJ
10.0000 mg | INTRAVENOUS | Status: DC | PRN
  Filled 2013-05-11: qty 2

## 2013-05-11 MED ORDER — LIDOCAINE HCL (PF) 1 % IJ SOLN
30.0000 mL | INTRAMUSCULAR | Status: DC | PRN
  Administered 2013-05-12: 30 mL via SUBCUTANEOUS
  Filled 2013-05-11 (×2): qty 30

## 2013-05-11 MED ORDER — PENICILLIN G POTASSIUM 5000000 UNITS IJ SOLR
5.0000 10*6.[IU] | Freq: Once | INTRAVENOUS | Status: AC
  Administered 2013-05-12: 5 10*6.[IU] via INTRAVENOUS
  Filled 2013-05-11: qty 5

## 2013-05-11 MED ORDER — FENTANYL 2.5 MCG/ML BUPIVACAINE 1/10 % EPIDURAL INFUSION (WH - ANES)
14.0000 mL/h | INTRAMUSCULAR | Status: DC | PRN
  Administered 2013-05-12: 14 mL/h via EPIDURAL
  Filled 2013-05-11 (×2): qty 125

## 2013-05-11 MED ORDER — ONDANSETRON HCL 4 MG/2ML IJ SOLN: 4.0000 mg | Freq: Four times a day (QID) | INTRAMUSCULAR | Status: DC | PRN

## 2013-05-11 MED ORDER — BUTORPHANOL TARTRATE 1 MG/ML IJ SOLN
1.0000 mg | Freq: Once | INTRAMUSCULAR | Status: AC
  Administered 2013-05-11: 1 mg via INTRAVENOUS
  Filled 2013-05-11: qty 1

## 2013-05-11 MED ORDER — IBUPROFEN 600 MG PO TABS: 600.0000 mg | ORAL_TABLET | Freq: Four times a day (QID) | ORAL | Status: DC | PRN

## 2013-05-11 MED ORDER — FENTANYL CITRATE 0.05 MG/ML IJ SOLN
100.0000 ug | INTRAMUSCULAR | Status: DC | PRN
  Administered 2013-05-12: 100 ug via INTRAVENOUS
  Filled 2013-05-11: qty 2

## 2013-05-11 MED ORDER — ACETAMINOPHEN 325 MG PO TABS: 650.0000 mg | ORAL_TABLET | ORAL | Status: DC | PRN

## 2013-05-11 MED ORDER — LACTATED RINGERS IV BOLUS (SEPSIS)
250.0000 mL | Freq: Once | INTRAVENOUS | Status: AC
  Administered 2013-05-11: 1000 mL via INTRAVENOUS

## 2013-05-11 MED ORDER — OXYTOCIN 40 UNITS IN LACTATED RINGERS INFUSION - SIMPLE MED
62.5000 mL/h | INTRAVENOUS | Status: DC
  Filled 2013-05-11: qty 1000

## 2013-05-11 MED ORDER — OXYCODONE-ACETAMINOPHEN 5-325 MG PO TABS
1.0000 | ORAL_TABLET | ORAL | Status: DC | PRN
  Filled 2013-05-11: qty 1

## 2013-05-11 MED ORDER — LACTATED RINGERS IV SOLN
INTRAVENOUS | Status: DC
  Administered 2013-05-11: via INTRAVENOUS

## 2013-05-11 MED ORDER — EPHEDRINE 5 MG/ML INJ
10.0000 mg | INTRAVENOUS | Status: DC | PRN
  Filled 2013-05-11: qty 4
  Filled 2013-05-11: qty 2
  Filled 2013-05-11: qty 4

## 2013-05-11 MED ORDER — LACTATED RINGERS IV SOLN
INTRAVENOUS | Status: DC
  Administered 2013-05-11: 14:00:00 via INTRAVENOUS

## 2013-05-11 MED ORDER — PROMETHAZINE HCL 25 MG/ML IJ SOLN
12.5000 mg | Freq: Once | INTRAMUSCULAR | Status: AC
  Administered 2013-05-11: 12.5 mg via INTRAVENOUS
  Filled 2013-05-11: qty 1

## 2013-05-11 MED ORDER — PHENYLEPHRINE 40 MCG/ML (10ML) SYRINGE FOR IV PUSH (FOR BLOOD PRESSURE SUPPORT)
80.0000 ug | PREFILLED_SYRINGE | INTRAVENOUS | Status: DC | PRN
  Filled 2013-05-11: qty 2

## 2013-05-11 MED ORDER — DIPHENHYDRAMINE HCL 50 MG/ML IJ SOLN: 12.5000 mg | INTRAMUSCULAR | Status: DC | PRN

## 2013-05-11 MED ORDER — LACTATED RINGERS IV SOLN
500.0000 mL | Freq: Once | INTRAVENOUS | Status: AC
  Administered 2013-05-12: 500 mL via INTRAVENOUS

## 2013-05-11 NOTE — H&P (Signed)
Katrina Blackwell is a 27 y.o. female presenting for labor check. Pt is 27yo female from [place], at [redacted]w[redacted]d, was seen earlier today in MAU for labor check and was 1cm at that time, rcv'd IVF's and pain meds and was dc'd home. States ctx have been getting closer this evening, last took percocet for pain about 10pm without relief. Denies any VB or LOF, states she's not been feeling very much fetal movement, (reported same today, as well).   HPI: Pt began PNC at [facility] at 22wks, pt had multiple MAU visits prior to this, [facility] had a +UA cx for GBS and was tx'd, then had 2 more recurrent UTI w abx resistance, she was given keflex, and then started on prophylaxis daily macrobid.  EDC 05/27/13 based on 8wk [place] and c/w LMP dating Pt was seen in MAU on 2/6 w flu like sx's and given tamiflu, tho no +flu swab was obtained On 5/29, pt c/o cold sx's, rapid strep A cx was neg Anatomy US appears to have been done on 2/21 at 20wks, but I am unable to view report at this time.  On 4/28 1hr gtt=156, a 3hr gtt had 1 elevated value    Maternal Medical History:  Reason for admission: Contractions.   Contractions: Onset was 6-12 hours ago.   Frequency: regular.   Duration is approximately 60 seconds.   Perceived severity is moderate.    Fetal activity: Perceived fetal activity is decreased.   Last perceived fetal movement was within the past 12 hours.    Prenatal complications: no prenatal complications   OB History   Grav Para Term Preterm Abortions TAB SAB Ect Mult Living   1              Past Medical History  Diagnosis Date  . Anxiety   . Infection 12/2012    URI-SEEN AT MAU  . Headache(784.0)     MIGRAINES;OTC   Past Surgical History  Procedure Laterality Date  . Tonsillectomy  2009   Family History: family history includes Diabetes in her father; Heart disease in her father; and Hyperlipidemia in her mother.  There is no history of Other. Social History:  reports that she has quit smoking. Her smoking use  included Cigarettes. She smoked 0.05 packs per day. She has never used smokeless tobacco. She reports that she does not drink alcohol or use illicit drugs.   Prenatal Transfer Tool  Maternal Diabetes: No elevated 1hr screen, 3hr gtt w 1 elevated value  Genetic Screening: Normal Maternal Ultrasounds/Referrals: Normal unable to see results of anatomy scan Fetal Ultrasounds or other Referrals:  None Maternal Substance Abuse:  No Significant Maternal Medications:  None Significant Maternal Lab Results:  Lab values include: Group B Strep positive Other Comments:  None  Review of Systems  All other systems reviewed and are negative.    Dilation: 4 Effacement (%): 100 Station: -1 Exam by:: S Elgie Maziarz CNM Blood pressure 120/91, pulse 89, temperature 100 F (37.8 C), temperature source Oral, resp. rate 20, last menstrual period 08/20/2012. Maternal Exam:  Uterine Assessment: Contraction strength is moderate.  Contraction duration is 60 seconds. Contraction frequency is regular.   Abdomen: Patient reports no abdominal tenderness. Fundal height is aga.   Estimated fetal weight is 6-7#.   Fetal presentation: vertex  Introitus: Normal vulva. Normal vagina.  Pelvis: adequate for delivery.   Cervix: Cervix evaluated by digital exam.     Fetal Exam Fetal Monitor Review: Mode: ultrasound.   Baseline rate: 120.  Variability: moderate (6-25 bpm).   Pattern: accelerations present and no decelerations.    Fetal State Assessment: Category I - tracings are normal.     Physical Exam  Nursing note and vitals reviewed. Constitutional: She is oriented to person, place, and time. She appears well-developed and well-nourished. She appears distressed.  Pt cries out with ctx, appears very anxious as well   HENT:  Head: Normocephalic.  Eyes: Pupils are equal, round, and reactive to light.  Neck: Normal range of motion.  Cardiovascular: Normal rate, regular rhythm and normal heart sounds.    Respiratory: Effort normal and breath sounds normal.  GI: Soft. Bowel sounds are normal.  Genitourinary: Vagina normal.  VE 4/100/-1 BBOW   Musculoskeletal: Normal range of motion.  Neurological: She is alert and oriented to person, place, and time. She has normal reflexes.  Skin: Skin is warm and dry.  Psychiatric: She has a normal mood and affect. Her behavior is normal.    Prenatal labs: ABO, Rh: --/--/O POS, O POS (06/23 1300) Antibody: NEG (06/23 1300) Rubella: 4.48 (02/19 1150) RPR: NON REACTIVE (06/23 1300)  HBsAg: NEGATIVE (02/19 1150)  HIV: NON REACTIVE (02/19 1150)  GBS: Positive (06/23 0000)  Date of GBS swab 6/18  GC/CT neg on 6/19  Pap neg 2/27 hgb electrophoresis - WNL 2/19 Varicella IgM equivocal, IgG neg  Quad screen WNL  hgb at NOB 11.4 1hr gtt 156, hgb 11.7, RPR NR 0n 4/28     Assessment/Plan: IUP at [redacted]w[redacted]d Early/active labor GBS pos FHR reassuring  Admit to b.s. Per c/w DR Richardson Dopp Routine L&D orders PCN per protocol for GBS prophylaxis  Fentanyl IVP, pt declines epidural at present  Expectant mgmnt    Katrina Blackwell M 05/11/2013, 11:29 PM

## 2013-05-11 NOTE — MAU Note (Signed)
uc's since 0600 this a.m., denies bleeding or LOF.

## 2013-05-11 NOTE — MAU Provider Note (Signed)
History   27 yo G1P0 at 52 5/7 weeks presented after husband called office reporting UCs q 10 min, but no FM since 5am.  Upon presentation, patient was very uncomfortable with contractions, requiring transport to room in wheelchair., and crying out with contractions.  Denies leaking or bleeding, aware of "light FM" since calling office.   Cervix was closed on exam last week.    Patient is from [place], but denies female circumcision.  Patient Active Problem List   Diagnosis Date Noted  . GBS (group B Streptococcus carrier), +RV culture, currently pregnant 05/11/2013  . Generalized anxiety disorder 05/11/2013  . Migraines 05/11/2013  . UTI in pregnancy 01/07/2013     Chief Complaint  Patient presents with  . Labor Eval     OB History   Grav Para Term Preterm Abortions TAB SAB Ect Mult Living   1               Past Medical History  Diagnosis Date  . Anxiety   . Infection 12/2012    URI-SEEN AT MAU  . Headache(784.0)     MIGRAINES;OTC    Past Surgical History  Procedure Laterality Date  . Tonsillectomy  2009    Family History  Problem Relation Age of Onset  . Other Neg Hx   . Hyperlipidemia Mother   . Diabetes Father   . Heart disease Father     History  Substance Use Topics  . Smoking status: Former Smoker -- 0.05 packs/day    Types: Cigarettes  . Smokeless tobacco: Never Used  . Alcohol Use: No    Allergies: No Known Allergies  Prescriptions prior to admission  Medication Sig Dispense Refill  . acetaminophen (TYLENOL) 325 MG tablet Take 650 mg by mouth every 6 (six) hours as needed. pain      . ondansetron (ZOFRAN) 4 MG tablet Take 1 tablet (4 mg total) by mouth every 8 (eight) hours as needed for nausea.  20 tablet  0     Physical Exam   Blood pressure 115/67, pulse 96, temperature 98.7 F (37.1 C), temperature source Oral, resp. rate 18, last menstrual period 08/20/2012, SpO2 100.00%.  Still crying out with contractions. Chest clear Heart RRR  without murmur Abd gravid, NT Pelvic--cervix posterior, 1 cm, 80%, vtx, -2, small amount bloody show on glove Ext WNL  FHR Category 1  UCs q 7 min  ED Course  IUP at 37 5/7 weeks Early vs prodromal labor--very uncomfortable GBS positive  Plan: IV hydration Pain med Recheck in 2 hours for cervical change.    Nigel Bridgeman CNM, [place] 05/11/2013 1:43 PM  Addendum: Slept after meds--UCs now less painful, irregular in occurrence. FHR reassuring, no decels Cervix unchanged, no show.  D/C home with discussion of labor precautions, comfort measures. Requested pain medication for home use. Rx Percocet for use--with instructions to come back with UCs q 3 min or prn. Keep scheduled appt on Wednesday or call prn.  Nigel Bridgeman, CNM, [place] 05/11/13 4p

## 2013-05-12 ENCOUNTER — Encounter (HOSPITAL_COMMUNITY): Payer: Self-pay | Admitting: *Deleted

## 2013-05-12 ENCOUNTER — Inpatient Hospital Stay (HOSPITAL_COMMUNITY): Payer: Medicaid Other | Admitting: Anesthesiology

## 2013-05-12 ENCOUNTER — Encounter (HOSPITAL_COMMUNITY): Payer: Self-pay | Admitting: Anesthesiology

## 2013-05-12 DIAGNOSIS — Z603 Acculturation difficulty: Secondary | ICD-10-CM

## 2013-05-12 DIAGNOSIS — Z789 Other specified health status: Secondary | ICD-10-CM

## 2013-05-12 DIAGNOSIS — IMO0001 Reserved for inherently not codable concepts without codable children: Secondary | ICD-10-CM

## 2013-05-12 HISTORY — DX: Acculturation difficulty: Z60.3

## 2013-05-12 LAB — TYPE AND SCREEN

## 2013-05-12 LAB — RPR: RPR Ser Ql: NONREACTIVE

## 2013-05-12 MED ORDER — ONDANSETRON HCL 4 MG/2ML IJ SOLN: 4.0000 mg | INTRAMUSCULAR | Status: DC | PRN

## 2013-05-12 MED ORDER — SODIUM BICARBONATE 8.4 % IV SOLN
INTRAVENOUS | Status: DC | PRN
  Administered 2013-05-12: 5 mL via EPIDURAL

## 2013-05-12 MED ORDER — PRENATAL MULTIVITAMIN CH
1.0000 | ORAL_TABLET | Freq: Every day | ORAL | Status: DC
  Administered 2013-05-13 – 2013-05-14 (×2): 1 via ORAL
  Filled 2013-05-12 (×2): qty 1

## 2013-05-12 MED ORDER — OXYCODONE-ACETAMINOPHEN 5-325 MG PO TABS
1.0000 | ORAL_TABLET | ORAL | Status: DC | PRN
  Administered 2013-05-12 – 2013-05-14 (×3): 1 via ORAL
  Filled 2013-05-12 (×2): qty 1

## 2013-05-12 MED ORDER — DIPHENHYDRAMINE HCL 25 MG PO CAPS: 25.0000 mg | ORAL_CAPSULE | Freq: Four times a day (QID) | ORAL | Status: DC | PRN

## 2013-05-12 MED ORDER — TETANUS-DIPHTH-ACELL PERTUSSIS 5-2.5-18.5 LF-MCG/0.5 IM SUSP
0.5000 mL | Freq: Once | INTRAMUSCULAR | Status: AC
  Administered 2013-05-14: 0.5 mL via INTRAMUSCULAR
  Filled 2013-05-12: qty 0.5

## 2013-05-12 MED ORDER — BENZOCAINE-MENTHOL 20-0.5 % EX AERO: 1.0000 "application " | INHALATION_SPRAY | CUTANEOUS | Status: DC | PRN

## 2013-05-12 MED ORDER — LANOLIN HYDROUS EX OINT: TOPICAL_OINTMENT | CUTANEOUS | Status: DC | PRN

## 2013-05-12 MED ORDER — IBUPROFEN 600 MG PO TABS
600.0000 mg | ORAL_TABLET | Freq: Four times a day (QID) | ORAL | Status: DC
  Administered 2013-05-12 – 2013-05-14 (×9): 600 mg via ORAL
  Filled 2013-05-12 (×8): qty 1

## 2013-05-12 MED ORDER — TERBUTALINE SULFATE 1 MG/ML IJ SOLN
0.2500 mg | Freq: Once | INTRAMUSCULAR | Status: DC | PRN
Start: 2013-05-12 — End: 2013-05-12

## 2013-05-12 MED ORDER — SENNOSIDES-DOCUSATE SODIUM 8.6-50 MG PO TABS
2.0000 | ORAL_TABLET | Freq: Every day | ORAL | Status: DC
  Administered 2013-05-12 – 2013-05-13 (×2): 2 via ORAL

## 2013-05-12 MED ORDER — WITCH HAZEL-GLYCERIN EX PADS: 1.0000 "application " | MEDICATED_PAD | CUTANEOUS | Status: DC | PRN

## 2013-05-12 MED ORDER — DIBUCAINE 1 % RE OINT: 1.0000 "application " | TOPICAL_OINTMENT | RECTAL | Status: DC | PRN

## 2013-05-12 MED ORDER — OXYTOCIN 40 UNITS IN LACTATED RINGERS INFUSION - SIMPLE MED
1.0000 m[IU]/min | INTRAVENOUS | Status: DC
  Administered 2013-05-12: 2 m[IU]/min via INTRAVENOUS
  Administered 2013-05-12: 4 m[IU]/min via INTRAVENOUS
  Administered 2013-05-12: 6 m[IU]/min via INTRAVENOUS

## 2013-05-12 MED ORDER — ONDANSETRON HCL 4 MG PO TABS: 4.0000 mg | ORAL_TABLET | ORAL | Status: DC | PRN

## 2013-05-12 MED ORDER — SIMETHICONE 80 MG PO CHEW: 80.0000 mg | CHEWABLE_TABLET | ORAL | Status: DC | PRN

## 2013-05-12 MED ORDER — ZOLPIDEM TARTRATE 5 MG PO TABS: 5.0000 mg | ORAL_TABLET | Freq: Every evening | ORAL | Status: DC | PRN

## 2013-05-12 NOTE — Progress Notes (Signed)
Patient ID: Katrina Blackwell, female   DOB: 1986-11-07, 27 y.o.   MRN: [id number] Katrina Blackwell is a 27 y.o. G1P0 at [redacted]w[redacted]d admitted for labor  Subjective: Called to [facility] by RN, pt c/o stronger urge to push  Objective: BP 127/87  Pulse 96  Temp(Src) 98.2 F (36.8 C) (Oral)  Resp 18  Ht 5\' 4"  (1.626 m)  Wt 157 lb (71.215 kg)  BMI 26.94 kg/m2  SpO2 100%  LMP 08/20/2012     FHT:  FHR: 120 bpm, variability: moderate,  accelerations:  Present,  decelerations:  Present early variables UC:   regular, every 2 minutes SVE:   Dilation: 10 Effacement (%): 90 Station: +1 Exam by:: Sanda Klein, CNM  No descent in vtx, ?OP  Assessment / Plan: attempt at pushing, pt very fatigued,   Labor: prolonged 2nd stage Preeclampsia:  no s/s Fetal Wellbeing:  Category I and Category II Pain Control:  Labor support without medications and discussed recommendation for epidural, pt agrees Anticipated MOD:  NSVD  Anesthesia notified, prep pt for epidural  Recheck after epidural  V.Emilee Hero, CNM at [facility] to take over care       Rinda Rollyson M 05/12/2013, 7:50 AM

## 2013-05-12 NOTE — Progress Notes (Signed)
  Subjective: Exhausted--family supportive at bedside.  Pushing since approx 5:10am.  Has received single dose of Fentanyl at 12:50am.  Objective: BP 125/84  Pulse 102  Temp(Src) 98.2 F (36.8 C) (Oral)  Resp 18  Ht 5\' 4"  (1.626 m)  Wt 157 lb (71.215 kg)  BMI 26.94 kg/m2  SpO2 100%  LMP 08/20/2012      Filed Vitals:   05/12/13 0811 05/12/13 0812 05/12/13 0813 05/12/13 0815  BP: 130/82   125/84  Pulse: 112 117 125 102  Temp:      TempSrc:      Resp: 18   18  Height:      Weight:      SpO2:         FHT:  Category 2--stable baseline, moderate variability, mild/moderate variables with pushes. UC:   q 2 min, mild/moderate SVE:   Dilation: 10 Effacement (%): 90 Station: +1 Exam by:: Katrina Blackwell, CNM Likely OP, pelvis feels adequate, with posterior room. On pitocin since 6:30am--currently on 4 mu/min.  Assessment / Plan: Prolonged 2nd stage Likely OP Recommended epidural to allow for maternal rest, continue pitocin, evaluate progress after patient comfortable.  Utilize positioning to facilitate descent and rotation. Dr. Stefano Gaul updated.  Katrina Blackwell 05/12/2013, 8:19 AM

## 2013-05-12 NOTE — Progress Notes (Addendum)
Patient ID: Katrina Blackwell, female   DOB: [birth year], 27 y.o.   MRN: [id number] Katrina Blackwell is a 27 y.o. G1P0 at [redacted]w[redacted]d admitted for labor  Subjective: RN called me to [facility] for c/o pressure and urge to push, has not rcv'd any pain meds, pt resting on L side calmly between ctx, but cries out w ctx, does well w coaching   Objective: BP 141/91  Pulse 73  Temp(Src) 98.4 F (36.9 C) (Oral)  Resp 20  Ht 5\' 4"  (1.626 m)  Wt 157 lb (71.215 kg)  BMI 26.94 kg/m2  SpO2 100%  LMP 08/20/2012     FHT:  FHR: 120 bpm, variability: moderate,  accelerations:  Present,  decelerations:  Absent UC:   regular, every 4-6 minutes SVE:   Dilation: 7 Effacement (%): 100 Station: -1 Exam by:: Vance Gather, CNM    Assessment / Plan: Spontaneous labor, progressing normally  Labor: Progressing normally Preeclampsia:  no s/s, BP slightly elevated, will CTO at present  Fetal Wellbeing:  Category I Pain Control:  requests epidural now Anticipated MOD:  NSVD  GBS pos, 1st dose PCN infusing now   Recheck after epidural placed, AROM prn  Update physician PRN   Malissa Hippo 05/12/2013, 12:51 AM

## 2013-05-12 NOTE — Progress Notes (Signed)
Patient ID: Katrina Blackwell, female   DOB: 1986-09-09, 27 y.o.   MRN: [id number] Katrina Blackwell is a 27 y.o. G1P0 at [redacted]w[redacted]d admitted for labor  Subjective: Pt crying out and pushing down, RN called me to BS  Objective: BP 130/56  Pulse 163  Temp(Src) 98.2 F (36.8 C) (Oral)  Resp 20  Ht 5\' 4"  (1.626 m)  Wt 157 lb (71.215 kg)  BMI 26.94 kg/m2  SpO2 100%  LMP 08/20/2012     FHT:  FHR: 120 bpm, variability: moderate,  accelerations:  Present,  decelerations:  Present occ mild early variables UC:   regular, every 2 minutes SVE:   Dilation: Lip/rim Effacement (%): 100 Station: 0 Exam by:: Sanda Klein, CNM    Assessment / Plan: Spontaneous labor, progressing normally  Labor: Progressing normally Preeclampsia:  BP stable no s/s Fetal Wellbeing:  Category I Pain Control:  Labor support without medications Anticipated MOD:  NSVD  Trial push, but cervix remains on R side and anterior, pt responds well to coaching and breathing w ctx, turned to R ex sims, then L, to facilitate fetal rotation   Recheck prn urge to push  Update physician PRN   Orestes Geiman M 05/12/2013, 4:30 AM

## 2013-05-12 NOTE — Progress Notes (Signed)
Patient ID: Katrina Blackwell, female   DOB: 12/26/1985, 27 y.o.   MRN: [id number] Katrina Blackwell is a 27 y.o. G1P0 at [redacted]w[redacted]d admitted for labor  Subjective: Called to [facility] by RN for pt c/o urge to push and crying in pain, several female visitors at bs as well as sig other. Pt sitting up in bed HF, appears calm, rcv'd dose of fentanyl at 0050, declined to receive epidural  Responds well to coaching, some involuntarily pushing   Objective: BP 122/87  Pulse 77  Temp(Src) 98.4 F (36.9 C) (Oral)  Resp 20  Ht 5\' 4"  (1.626 m)  Wt 157 lb (71.215 kg)  BMI 26.94 kg/m2  SpO2 100%  LMP 08/20/2012     FHT:  FHR: 120 bpm, variability: moderate,  accelerations:  Present,  decelerations:  Present mild early variable UC:   regular, every 4-5  minutes SVE:   Dilation: 8 Effacement (%): 100 Station: -1 Exam by:: Sanda Klein, CNM  AROM< mod amt thick MSAF noted, ?particulate,    Assessment / Plan: Spontaneous labor, progressing normally  Labor: Progressing normally - transition  Preeclampsia:  some BP elevations, overall normotensive, no s/s Fetal Wellbeing:  Category I Pain Control:  Labor support without medications and Fentanyl Anticipated MOD:  NSVD  Recheck prn urge to push   Update physician PRN   Katrina Blackwell M 05/12/2013, 3:37 AM

## 2013-05-12 NOTE — Progress Notes (Addendum)
Patient ID: Katrina Blackwell, female   DOB: 04/22/1986, 27 y.o.   MRN: [id number] Katrina Blackwell is a 27 y.o. G1P0 at [redacted]w[redacted]d admitted for labor  Subjective: Cervix complete at 0509 and pt had urge to push, declines epidural   Objective: BP 139/110  Pulse 97  Temp(Src) 98.2 F (36.8 C) (Oral)  Resp 22  Ht 5\' 4"  (1.626 m)  Wt 157 lb (71.215 kg)  BMI 26.94 kg/m2  SpO2 100%  LMP 08/20/2012     FHT:  FHR: 120 bpm, variability: moderate,  accelerations:  Present,  decelerations:  Present occ mild early variables UC:   irregular, every 2-5 minutes, some coupling, ctx not all palpate strong SVE:   Dilation: 10 Effacement (%): 90 Station: +1 Exam by:: Sanda Klein, CNM    Assessment / Plan: vtx initially OT/OP, pushing x1hr, pt not pushing well with most ctx  Labor: likely inadequate ctx patttern Preeclampsia:  no s/s, BP's overall stable Fetal Wellbeing:  Category I Pain Control:  Labor support without medications Anticipated MOD:  NSVD  GBS +, has rcv'd 2 doses PCN Will begin pitocin augmentation and allow to labor down  Recheck prn w stronger urge to push  Dr Richardson Dopp updated   Malissa Hippo 05/12/2013, 6:25 AM

## 2013-05-12 NOTE — Anesthesia Preprocedure Evaluation (Signed)

## 2013-05-12 NOTE — Anesthesia Procedure Notes (Signed)
Epidural Patient location during procedure: OB  Preanesthetic Checklist Completed: patient identified, site marked, surgical consent, pre-op evaluation, timeout performed, IV checked, risks and benefits discussed and monitors and equipment checked  Epidural Patient position: sitting Prep: site prepped and draped and DuraPrep Patient monitoring: continuous pulse ox and blood pressure Approach: midline Injection technique: LOR air  Needle:  Needle type: Tuohy  Needle gauge: 17 G Needle length: 9 cm and 9 Needle insertion depth: 6 cm Catheter type: closed end flexible Catheter size: 19 Gauge Catheter at skin depth: 11 cm Test dose: negative  Assessment Events: blood not aspirated, injection not painful, no injection resistance, negative IV test and no paresthesia  Additional Notes Dosing of Epidural:  1st dose, through catheter ............................................. epi 1:200K + Xylocaine 40 mg  2nd dose, through catheter, after waiting 3 minutes.....epi 1:200K + Xylocaine 60 mg    ( 2% Xylo charted as a single dose in Epic Meds for ease of charting; actual dosing was fractionated as above, for saftey's sake)  As each dose occurred, patient was free of IV sx; and patient exhibited no evidence of SA injection.  Patient is more comfortable after epidural dosed. Please see RN's note for documentation of vital signs,and FHR which are stable.  Patient reminded not to try to ambulate with numb legs, and that an RN must be present when she attempts to get up.       

## 2013-05-12 NOTE — Progress Notes (Signed)
  Subjective: Sleeping soundly--no pressure or urge to push.  Now on contact precautions per Infection Prevention.  Had E.coli culture 12/27/12 showing "confirmed extended spectrum beta-lactamase producer".  Per IP, patient will need to be on contact precautions with every hospitalization until further notice.  Patient and family informed.  Objective: BP 117/84  Pulse 86  Temp(Src) 98 F (36.7 C) (Oral)  Resp 18  Ht 5\' 4"  (1.626 m)  Wt 157 lb (71.215 kg)  BMI 26.94 kg/m2  SpO2 100%  LMP 08/20/2012      FHT:  Category 1 UC:   q2-4 min, still irregular pattern. Pitocin on 12 mu/min SVE:  Complete, vtx now +2--much lower in pelvis, likely still OP.  Assessment / Plan: 2nd stage labor Laboring down Hx of resistant Ecoli Will CTO at present--expect further descent/rotation in next hour. Contact precautions.  Katrina Blackwell 05/12/2013, 10:01 AM

## 2013-05-12 NOTE — Progress Notes (Signed)
  Subjective: Sleeping at present.  Family at bedside.  Objective: BP 117/78  Pulse 95  Temp(Src) 98 F (36.7 C) (Oral)  Resp 18  Ht 5\' 4"  (1.626 m)  Wt 157 lb (71.215 kg)  BMI 26.94 kg/m2  SpO2 100%  LMP 08/20/2012      FHT:  Category 2--mild variables with UCs. UC:   q2-4 min, irregular pattern. Pitocin on 8 mu/min.  Assessment / Plan: 2nd stage labor OP Will re-evaluate in next 30 min for status.  Nigel Bridgeman 05/12/2013, 9:31 AM

## 2013-05-13 NOTE — Progress Notes (Signed)
Ur chart review completed.  

## 2013-05-13 NOTE — Progress Notes (Signed)
Post Partum Day 1: S/P SVD with 1st degree lac  Subjective: Patient up ad lib, denies syncope or dizziness. Feeding:  breastfeeding Contraceptive plan:   Unknown at this time  Objective: Blood pressure 113/74, pulse 93, temperature 98 F (36.7 C), temperature source Oral, resp. rate 18, height 5\' 4"  (1.626 m), weight 157 lb (71.215 kg), last menstrual period 08/20/2012, SpO2 100.00%, unknown if currently breastfeeding.  Physical Exam:  General: alert, cooperative and no distress Lochia: appropriate Uterine Fundus: firm Incision: healing well DVT Evaluation: No evidence of DVT seen on physical exam. Negative Homan's sign.   Recent Labs  05/11/13 2330 05/13/13 0630  HGB 12.1 9.8*  HCT 36.0 28.6*    Assessment/Plan: S/P Vaginal delivery day 1 Continue current care Plan for discharge tomorrow   LOS: 2 days   Carinna Newhart 05/13/2013, 2:03 PM

## 2013-05-13 NOTE — Anesthesia Postprocedure Evaluation (Signed)
  Anesthesia Post-op Note  Patient: Katrina Blackwell  Procedure(s) Performed: * No procedures listed *  Patient Location: PACU and Mother/Baby  Anesthesia Type:Epidural  Level of Consciousness: awake, alert  and oriented  Airway and Oxygen Therapy: Patient Spontanous Breathing  Post-op Pain: none  Post-op Assessment: Post-op Vital signs reviewed, Patient's Cardiovascular Status Stable, No headache, No backache, No residual numbness and No residual motor weakness  Post-op Vital Signs: Reviewed and stable  Complications: No apparent anesthesia complications

## 2013-05-14 MED ORDER — OXYCODONE-ACETAMINOPHEN 5-325 MG PO TABS: 1.0000 | ORAL_TABLET | ORAL | Status: DC | PRN

## 2013-05-14 MED ORDER — IBUPROFEN 600 MG PO TABS: 600.0000 mg | ORAL_TABLET | Freq: Four times a day (QID) | ORAL | Status: DC

## 2013-05-14 MED ORDER — FERROUS SULFATE 325 (65 FE) MG PO TABS: 325.0000 mg | ORAL_TABLET | Freq: Every day | ORAL | Status: DC

## 2013-05-14 NOTE — Discharge Summary (Signed)
  Vaginal Delivery Discharge Summary  Danisha Brassfield  DOB:    [birth year] MRN:    [id number] CSN:    [id number]  Date of admission:                  05/11/13  Date of discharge:                   05/14/13  Procedures this admission: SVD with 1st degree lac  Date of Delivery: 05/12/13 Nigel Bridgeman  Newborn Data:  Live born female  Birth Weight: 6 lb 3 oz (2807 g) APGAR: 3, 7  Home with mother. Name: "Mohammed" Circumcision Plan: no circ  History of Present Illness:  Ms. Aarion Kittrell is a 27 y.o. female, G1P1001, who presents at [redacted]w[redacted]d weeks gestation. The patient has been followed at the [facility] and Gynecology division of [facility]. She was admitted onset of labor. Her pregnancy has been complicated by:  Patient Active Problem List   Diagnosis Date Noted  . Active labor 05/12/2013  . Language barrier 05/12/2013  . Vaginal delivery 05/12/2013  . Laceration of periurethral tissue with delivery 05/12/2013  . GBS (group B Streptococcus carrier), +RV culture, currently pregnant 05/11/2013  . Generalized anxiety disorder 05/11/2013  . Migraines 05/11/2013  . UTI in pregnancy 01/07/2013     Hospital course:  The patient was admitted for labor.   Her labor was not complicated. She proceeded to have a vaginal delivery of a healthy infant. Her delivery was not complicated. Her postpartum course was not complicated.  She was discharged to home on postpartum day 2 doing well.  Feeding:  breast  Contraception:  IUD  Discharge hemoglobin:  Hemoglobin  Date Value Range Status  05/13/2013 9.8* 12.0 - 15.0 g/dL Final     REPEATED TO VERIFY     DELTA CHECK NOTED     HCT  Date Value Range Status  05/13/2013 28.6* 36.0 - 46.0 % Final    Discharge Physical Exam:   General: alert, cooperative and no distress Lochia: appropriate Uterine Fundus: firm Incision: healing well DVT Evaluation: No evidence of DVT seen on physical exam. Negative  Homan's sign.  Intrapartum Procedures: spontaneous vaginal delivery and GBS prophylaxis Postpartum Procedures: none Complications-Operative and Postpartum: 1st degree perineal laceration  Discharge Diagnoses: Term Pregnancy-delivered  Discharge Information:  Activity:           pelvic rest Diet:                routine Medications: PNV, Ibuprofen, Iron and Percocet Condition:      stable Instructions:  refer to practice specific booklet Discharge to: home     Haroldine Laws 05/14/2013

## 2013-05-14 NOTE — Progress Notes (Signed)

## 2013-05-15 ENCOUNTER — Ambulatory Visit: Payer: Self-pay

## 2013-05-15 NOTE — Lactation Note (Addendum)
This note was copied from the chart of Katrina Blackwell. Lactation Consultation Note Mom request pump rental. Discussed our [facility] loaner pump program, instructed mom to return the pump within 7 days for a refund of her $30 down payment. However, mom and dad do not have $30, nor do they have a way to get the $30 today. Hand pump provided, mom and dad instructed in its use.   Inst mom to call lactation office and make an o/p appt when her milk comes in. Mom aware of lactation services and [facility]. Mom verbalizes understanding. Questions answered.  Patient Name: Katrina Gennifer Potenza [id number] Date: 05/15/2013 Reason for consult: Pump rental   Maternal Data    Feeding Feeding Type: Formula Feeding method: Bottle Nipple Type: Slow - flow  LATCH Score/Interventions                      Lactation Tools Discussed/Used     Consult Status Consult Status: Follow-up Follow-up type: Out-patient    Octavio Manns [facility] 05/15/2013, 11:54 AM

## 2013-05-18 LAB — CBC
MCH: 30.1 pg (ref 26.0–34.0)
MCHC: 34.3 g/dL (ref 30.0–36.0)
Platelets: 128 10*3/uL — ABNORMAL LOW (ref 150–400)

## 2014-09-20 ENCOUNTER — Encounter (HOSPITAL_COMMUNITY): Payer: Self-pay | Admitting: *Deleted

## 2015-03-09 ENCOUNTER — Emergency Department (HOSPITAL_COMMUNITY)
Admission: EM | Admit: 2015-03-09 | Discharge: 2015-03-09 | Disposition: A | Discharge status: Home / Self Care | Payer: Medicaid Other | Attending: Emergency Medicine | Admitting: Emergency Medicine

## 2015-03-09 DIAGNOSIS — R6889 Other general symptoms and signs: Secondary | ICD-10-CM

## 2015-03-09 DIAGNOSIS — G43009 Migraine without aura, not intractable, without status migrainosus: Secondary | ICD-10-CM

## 2015-03-09 DIAGNOSIS — Z79899 Other long term (current) drug therapy: Secondary | ICD-10-CM | POA: Insufficient documentation

## 2015-03-09 DIAGNOSIS — R509 Fever, unspecified: Secondary | ICD-10-CM | POA: Insufficient documentation

## 2015-03-09 DIAGNOSIS — J029 Acute pharyngitis, unspecified: Secondary | ICD-10-CM | POA: Insufficient documentation

## 2015-03-09 DIAGNOSIS — Z87891 Personal history of nicotine dependence: Secondary | ICD-10-CM | POA: Insufficient documentation

## 2015-03-09 DIAGNOSIS — G43909 Migraine, unspecified, not intractable, without status migrainosus: Secondary | ICD-10-CM | POA: Insufficient documentation

## 2015-03-09 DIAGNOSIS — Z8659 Personal history of other mental and behavioral disorders: Secondary | ICD-10-CM | POA: Insufficient documentation

## 2015-03-09 MED ORDER — SODIUM CHLORIDE 0.9 % IV BOLUS (SEPSIS)
1000.0000 mL | Freq: Once | INTRAVENOUS | Status: AC
  Administered 2015-03-09: 1000 mL via INTRAVENOUS

## 2015-03-09 MED ORDER — ONDANSETRON 4 MG PO TBDP: 4.0000 mg | ORAL_TABLET | Freq: Three times a day (TID) | ORAL | Status: DC | PRN

## 2015-03-09 MED ORDER — METOCLOPRAMIDE HCL 5 MG/ML IJ SOLN
10.0000 mg | Freq: Once | INTRAMUSCULAR | Status: AC
  Administered 2015-03-09: 10 mg via INTRAVENOUS
  Filled 2015-03-09: qty 2

## 2015-03-09 MED ORDER — KETOROLAC TROMETHAMINE 30 MG/ML IJ SOLN
30.0000 mg | Freq: Once | INTRAMUSCULAR | Status: AC
  Administered 2015-03-09: 30 mg via INTRAVENOUS
  Filled 2015-03-09: qty 1

## 2015-03-09 MED ORDER — ACETAMINOPHEN 500 MG PO TABS
1000.0000 mg | ORAL_TABLET | Freq: Once | ORAL | Status: AC
  Administered 2015-03-09: 1000 mg via ORAL
  Filled 2015-03-09: qty 2

## 2015-03-09 MED ORDER — DIPHENHYDRAMINE HCL 50 MG/ML IJ SOLN
50.0000 mg | Freq: Once | INTRAMUSCULAR | Status: AC
  Administered 2015-03-09: 50 mg via INTRAVENOUS
  Filled 2015-03-09: qty 1

## 2015-03-09 MED ORDER — OSELTAMIVIR PHOSPHATE 75 MG PO CAPS: 75.0000 mg | ORAL_CAPSULE | Freq: Two times a day (BID) | ORAL | Status: DC

## 2015-03-09 NOTE — ED Notes (Signed)
Bed: ON62WA16 Expected date:  Expected time:  Means of arrival:  Comments: EMS 29 yo headache and chills

## 2015-03-09 NOTE — Discharge Instructions (Signed)
Please alternate between Tylenol 1000 mg every 6 hours as needed for fever and pain and ibuprofen 800 mg every 8 hours as needed for fever and pain. Both of these medications are found over-the-counter.   Possible Influenza Influenza ("the flu") is a viral infection of the respiratory tract. It occurs more often in winter months because people spend more time in close contact with one another. Influenza can make you feel very sick. Influenza easily spreads from person to person (contagious). CAUSES  Influenza is caused by a virus that infects the respiratory tract. You can catch the virus by breathing in droplets from an infected person's cough or sneeze. You can also catch the virus by touching something that was recently contaminated with the virus and then touching your mouth, nose, or eyes. RISKS AND COMPLICATIONS You may be at risk for a more severe case of influenza if you smoke cigarettes, have diabetes, have chronic heart disease (such as heart failure) or lung disease (such as asthma), or if you have a weakened immune system. Elderly people and pregnant women are also at risk for more serious infections. The most common problem of influenza is a lung infection (pneumonia). Sometimes, this problem can require emergency medical care and may be life threatening. SIGNS AND SYMPTOMS  Symptoms typically last 4 to 10 days and may include:  Fever.  Chills.  Headache, body aches, and muscle aches.  Sore throat.  Chest discomfort and cough.  Poor appetite.  Weakness or feeling tired.  Dizziness.  Nausea or vomiting. DIAGNOSIS  Diagnosis of influenza is often made based on your history and a physical exam. A nose or throat swab test can be done to confirm the diagnosis. TREATMENT  In mild cases, influenza goes away on its own. Treatment is directed at relieving symptoms. For more severe cases, your health care provider may prescribe antiviral medicines to shorten the sickness.  Antibiotic medicines are not effective because the infection is caused by a virus, not by bacteria. HOME CARE INSTRUCTIONS  Take medicines only as directed by your health care provider.  Use a cool mist humidifier to make breathing easier.  Get plenty of rest until your temperature returns to normal. This usually takes 3 to 4 days.  Drink enough fluid to keep your urine clear or pale yellow.  Cover yourmouth and nosewhen coughing or sneezing,and wash your handswellto prevent thevirusfrom spreading.  Stay homefromwork orschool untilthe fever is gonefor at least 121full day. PREVENTION  An annual influenza vaccination (flu shot) is the best way to avoid getting influenza. An annual flu shot is now routinely recommended for all adults in the [place] SEEK MEDICAL CARE IF:  You experiencechest pain, yourcough worsens,or you producemore mucus.  Youhave nausea,vomiting, ordiarrhea.  Your fever returns or gets worse. SEEK IMMEDIATE MEDICAL CARE IF:  You havetrouble breathing, you become short of breath,or your skin ornails becomebluish.  You have severe painor stiffnessin the neck.  You develop a sudden headache, or pain in the face or ear.  You have nausea or vomiting that you cannot control. MAKE SURE YOU:   Understand these instructions.  Will watch your condition.  Will get help right away if you are not doing well or get worse. Document Released: 11/02/2000 Document Revised: 03/22/2014 Document Reviewed: 02/04/2012 [facility] Patient Information 2015 [place], [place]. This information is not intended to replace advice given to you by your health care provider. Make sure you discuss any questions you have with your health care provider.  Migraine Headache A migraine headache is an intense, throbbing pain on one or both sides of your head. A migraine can last for 30 minutes to several hours. CAUSES  The exact cause of a migraine headache is not always  known. However, a migraine may be caused when nerves in the brain become irritated and release chemicals that cause inflammation. This causes pain. Certain things may also trigger migraines, such as:  Alcohol.  Smoking.  Stress.  Menstruation.  Aged cheeses.  Foods or drinks that contain nitrates, glutamate, aspartame, or tyramine.  Lack of sleep.  Chocolate.  Caffeine.  Hunger.  Physical exertion.  Fatigue.  Medicines used to treat chest pain (nitroglycerine), birth control pills, estrogen, and some blood pressure medicines. SIGNS AND SYMPTOMS  Pain on one or both sides of your head.  Pulsating or throbbing pain.  Severe pain that prevents daily activities.  Pain that is aggravated by any physical activity.  Nausea, vomiting, or both.  Dizziness.  Pain with exposure to bright lights, loud noises, or activity.  General sensitivity to bright lights, loud noises, or smells. Before you get a migraine, you may get warning signs that a migraine is coming (aura). An aura may include:  Seeing flashing lights.  Seeing bright spots, halos, or zigzag lines.  Having tunnel vision or blurred vision.  Having feelings of numbness or tingling.  Having trouble talking.  Having muscle weakness. DIAGNOSIS  A migraine headache is often diagnosed based on:  Symptoms.  Physical exam.  A CT scan or MRI of your head. These imaging tests cannot diagnose migraines, but they can help rule out other causes of headaches. TREATMENT Medicines may be given for pain and nausea. Medicines can also be given to help prevent recurrent migraines.  HOME CARE INSTRUCTIONS  Only take over-the-counter or prescription medicines for pain or discomfort as directed by your health care provider. The use of long-term narcotics is not recommended.  Lie down in a dark, quiet room when you have a migraine.  Keep a journal to find out what may trigger your migraine headaches. For example,  write down:  What you eat and drink.  How much sleep you get.  Any change to your diet or medicines.  Limit alcohol consumption.  Quit smoking if you smoke.  Get 7-9 hours of sleep, or as recommended by your health care provider.  Limit stress.  Keep lights dim if bright lights bother you and make your migraines worse. SEEK IMMEDIATE MEDICAL CARE IF:   Your migraine becomes severe.  You have a fever.  You have a stiff neck.  You have vision loss.  You have muscular weakness or loss of muscle control.  You start losing your balance or have trouble walking.  You feel faint or pass out.  You have severe symptoms that are different from your first symptoms. MAKE SURE YOU:   Understand these instructions.  Will watch your condition.  Will get help right away if you are not doing well or get worse. Document Released: 11/05/2005 Document Revised: 03/22/2014 Document Reviewed: 07/13/2013 [facility] Patient Information 2015 [place], [place]. This information is not intended to replace advice given to you by your health care provider. Make sure you discuss any questions you have with your health care provider.

## 2015-03-09 NOTE — ED Notes (Signed)
Spoke with on call infection prevention staff who stated that patient was not at risk for Ebola as the only hot spot now is [place]. Dr. Elesa MassedWard will be notified.

## 2015-03-09 NOTE — ED Provider Notes (Signed)
TIME SEEN: 2:05 AM  CHIEF COMPLAINT: Flulike symptoms, headache  HPI: Pt is a 29 y.o. female with history of migraine headaches who presents to the emergency department complaints of subjective fever, sore throat, nausea, body aches, chills that started yesterday. She is also complaining of a left-sided throbbing headache that is similar to her prior migraines. No history of head injury. No neck pain or neck stiffness. No rash. Not on anticoagulation. Denies numbness, tingling or focal weakness. Denies sick contacts. Was in [place] 7 weeks ago but has no other international travel. States she is having a dry cough. Did not have an influenza vaccination this year. Last Tylenol was 10pm.  ROS: See HPI Constitutional:  fever  Eyes: no drainage  ENT: no runny nose   Cardiovascular:  no chest pain  Resp: no SOB  GI: no vomiting GU: no dysuria Integumentary: no rash  Allergy: no hives  Musculoskeletal: no leg swelling  Neurological: no slurred speech ROS otherwise negative  PAST MEDICAL HISTORY/PAST SURGICAL HISTORY:  Past Medical History  Diagnosis Date  . Anxiety   . Infection 12/2012    URI-SEEN AT [facility]  . Headache(784.0)     MIGRAINES;OTC    MEDICATIONS:  Prior to Admission medications   Medication Sig Start Date End Date Taking? Authorizing Provider  ferrous sulfate 325 (65 FE) MG tablet Take 1 tablet (325 mg total) by mouth daily with breakfast. 05/14/13   Haroldine LawsJennifer Oxley, CNM  ibuprofen (ADVIL,MOTRIN) 600 MG tablet Take 1 tablet (600 mg total) by mouth every 6 (six) hours. 05/14/13   Haroldine LawsJennifer Oxley, CNM  oxyCODONE-acetaminophen (PERCOCET/ROXICET) 5-325 MG per tablet Take 1-2 tablets by mouth every 4 (four) hours as needed. 05/14/13   Haroldine LawsJennifer Oxley, CNM    ALLERGIES:  No Known Allergies  SOCIAL HISTORY:  History  Substance Use Topics  . Smoking status: Former Smoker -- 0.05 packs/day    Types: Cigarettes  . Smokeless tobacco: Never Used  . Alcohol Use: No    FAMILY  HISTORY: Family History  Problem Relation Age of Onset  . Other Neg Hx   . Hyperlipidemia Mother   . Diabetes Father   . Heart disease Father     EXAM: BP 107/65 mmHg  Pulse 118  Temp(Src) 98.1 F (36.7 C) (Oral)  Resp 18  SpO2 99% CONSTITUTIONAL: Alert and oriented and responds appropriately to questions. Well-appearing; well-nourished, appears uncomfortable but is nontoxic HEAD: Normocephalic EYES: Conjunctivae clear, PERRL ENT: normal nose; no rhinorrhea; moist mucous membranes; pharynx without lesions noted, no tonsillar hypertrophy or exudate, no uvular deviation NECK: Supple, no meningismus, no LAD  CARD: Regular and tachycardic; S1 and S2 appreciated; no murmurs, no clicks, no rubs, no gallops RESP: Normal chest excursion without splinting or tachypnea; breath sounds clear and equal bilaterally; no wheezes, no rhonchi, no rales, no hypoxia or respiratory distress ABD/GI: Normal bowel sounds; non-distended; soft, non-tender, no rebound, no guarding BACK:  The back appears normal and is non-tender to palpation, there is no CVA tenderness EXT: Normal ROM in all joints; non-tender to palpation; no edema; normal capillary refill; no cyanosis    SKIN: Normal color for age and race; warm, no rash NEURO: Moves all extremities equally, sensation to light touch intact diffusely, cranial nerves II through XII intact PSYCH: The patient's mood and manner are appropriate. Grooming and personal hygiene are appropriate.  MEDICAL DECISION MAKING: Patient here with flulike symptoms, migraine headache. She is nontoxic on exam. Lungs are clear. Abdomen soft. No rash. No meningismus. Nursing  staff has discussed patient's case with infectious control. They state that she is not at risk for Ebola.  States her last travel was over 7 weeks ago.  We'll treat symptomatically with fluids, Toradol, Reglan.  ED PROGRESS: Patient reports headache and myalgias are completely resolved after migraine cocktail.  She reports feeling much better. Is smiling. Tachycardia has also improved. I feel she is safe to be discharged home. Discussed return precautions. Will discharge with prescription for Tamiflu and Zofran. Have advised her to alternate Tylenol and Motrin.  Will give outpatient follow-up information.   Patient verbalizes understanding and is comfortable with plan.      8:15 AM  Upon further review patient was in a high-risk malaria area 7 weeks ago. I have attempted to contact her at her home number [phone] without success. I have talked to [place] at the [facility] health Department at [phone]. Have recommended faxing all patients information to [phone] and they will follow-up with the patient.  Layla Maw Ward, DO 03/09/15 [phone]

## 2015-03-09 NOTE — ED Notes (Signed)
Patient arrives by EMS with complaints of fever, headache and chills-traveled to [place] 5 [place] ago

## 2015-03-11 ENCOUNTER — Emergency Department (HOSPITAL_COMMUNITY): Payer: Medicaid Other

## 2015-03-11 ENCOUNTER — Emergency Department (HOSPITAL_COMMUNITY)
Admission: EM | Admit: 2015-03-11 | Discharge: 2015-03-12 | Disposition: A | Discharge status: Home / Self Care | Payer: Medicaid Other | Attending: Emergency Medicine | Admitting: Emergency Medicine

## 2015-03-11 ENCOUNTER — Encounter (HOSPITAL_COMMUNITY): Payer: Self-pay | Admitting: *Deleted

## 2015-03-11 DIAGNOSIS — J159 Unspecified bacterial pneumonia: Secondary | ICD-10-CM | POA: Insufficient documentation

## 2015-03-11 DIAGNOSIS — R000 Tachycardia, unspecified: Secondary | ICD-10-CM | POA: Insufficient documentation

## 2015-03-11 DIAGNOSIS — Z79899 Other long term (current) drug therapy: Secondary | ICD-10-CM | POA: Insufficient documentation

## 2015-03-11 DIAGNOSIS — Z8619 Personal history of other infectious and parasitic diseases: Secondary | ICD-10-CM | POA: Insufficient documentation

## 2015-03-11 DIAGNOSIS — J189 Pneumonia, unspecified organism: Secondary | ICD-10-CM

## 2015-03-11 DIAGNOSIS — Z87891 Personal history of nicotine dependence: Secondary | ICD-10-CM | POA: Insufficient documentation

## 2015-03-11 DIAGNOSIS — Z7982 Long term (current) use of aspirin: Secondary | ICD-10-CM | POA: Insufficient documentation

## 2015-03-11 DIAGNOSIS — Z8659 Personal history of other mental and behavioral disorders: Secondary | ICD-10-CM | POA: Insufficient documentation

## 2015-03-11 LAB — CBC WITH DIFFERENTIAL/PLATELET
BASOS ABS: 0 10*3/uL (ref 0.0–0.1)
BASOS PCT: 0 % (ref 0–1)
Eosinophils Absolute: 0.1 10*3/uL (ref 0.0–0.7)
Eosinophils Relative: 2 % (ref 0–5)
HEMATOCRIT: 37.4 % (ref 36.0–46.0)
Hemoglobin: 12 g/dL (ref 12.0–15.0)
LYMPHS PCT: 21 % (ref 12–46)
Lymphs Abs: 1 10*3/uL (ref 0.7–4.0)
MCH: 27 pg (ref 26.0–34.0)
MCHC: 32.1 g/dL (ref 30.0–36.0)
MCV: 84 fL (ref 78.0–100.0)
MONO ABS: 0.4 10*3/uL (ref 0.1–1.0)
Monocytes Relative: 8 % (ref 3–12)
NEUTROS ABS: 3.4 10*3/uL (ref 1.7–7.7)
NEUTROS PCT: 69 % (ref 43–77)
PLATELETS: 194 10*3/uL (ref 150–400)
RBC: 4.45 MIL/uL (ref 3.87–5.11)
RDW: 12.4 % (ref 11.5–15.5)
WBC: 4.9 10*3/uL (ref 4.0–10.5)

## 2015-03-11 LAB — COMPREHENSIVE METABOLIC PANEL
ALK PHOS: 54 U/L (ref 39–117)
ALT: 28 U/L (ref 0–35)
AST: 35 U/L (ref 0–37)
Albumin: 3.9 g/dL (ref 3.5–5.2)
Anion gap: 11 (ref 5–15)
BUN: <5 mg/dL — ABNORMAL LOW (ref 6–23)
CHLORIDE: 100 mmol/L (ref 96–112)
CO2: 25 mmol/L (ref 19–32)
Calcium: 9.2 mg/dL (ref 8.4–10.5)
Creatinine, Ser: 0.68 mg/dL (ref 0.50–1.10)
GLUCOSE: 93 mg/dL (ref 70–99)
POTASSIUM: 3.5 mmol/L (ref 3.5–5.1)
SODIUM: 136 mmol/L (ref 135–145)
TOTAL PROTEIN: 8.2 g/dL (ref 6.0–8.3)
Total Bilirubin: 0.3 mg/dL (ref 0.3–1.2)

## 2015-03-11 LAB — I-STAT CG4 LACTIC ACID, ED: LACTIC ACID, VENOUS: 1 mmol/L (ref 0.5–2.0)

## 2015-03-11 MED ORDER — IBUPROFEN 800 MG PO TABS
800.0000 mg | ORAL_TABLET | Freq: Four times a day (QID) | ORAL | Status: DC | PRN
  Administered 2015-03-11: 800 mg via ORAL
  Filled 2015-03-11: qty 2

## 2015-03-11 MED ORDER — KETOROLAC TROMETHAMINE 30 MG/ML IJ SOLN
30.0000 mg | Freq: Once | INTRAMUSCULAR | Status: AC
  Administered 2015-03-12: 30 mg via INTRAVENOUS
  Filled 2015-03-11: qty 1

## 2015-03-11 MED ORDER — LEVOFLOXACIN IN D5W 500 MG/100ML IV SOLN
500.0000 mg | Freq: Once | INTRAVENOUS | Status: AC
  Administered 2015-03-11: 500 mg via INTRAVENOUS
  Filled 2015-03-11: qty 100

## 2015-03-11 MED ORDER — ONDANSETRON 4 MG PO TBDP
8.0000 mg | ORAL_TABLET | Freq: Once | ORAL | Status: AC
  Administered 2015-03-11: 8 mg via ORAL
  Filled 2015-03-11: qty 2

## 2015-03-11 MED ORDER — SODIUM CHLORIDE 0.9 % IV BOLUS (SEPSIS)
1000.0000 mL | Freq: Once | INTRAVENOUS | Status: AC
  Administered 2015-03-11: 1000 mL via INTRAVENOUS

## 2015-03-11 MED ORDER — HYDROCODONE-ACETAMINOPHEN 5-325 MG PO TABS
1.0000 | ORAL_TABLET | Freq: Once | ORAL | Status: AC
  Administered 2015-03-12: 1 via ORAL
  Filled 2015-03-11: qty 1

## 2015-03-11 MED ORDER — ACETAMINOPHEN 500 MG PO TABS
1000.0000 mg | ORAL_TABLET | Freq: Once | ORAL | Status: AC
  Administered 2015-03-11: 1000 mg via ORAL
  Filled 2015-03-11: qty 2

## 2015-03-11 NOTE — ED Provider Notes (Signed)
CSN: [id number]     Arrival date & time 03/11/15  1735 History   First MD Initiated Contact with Patient 03/11/15 2019     Chief Complaint  Patient presents with  . Generalized Body Aches     (Consider location/radiation/quality/duration/timing/severity/associated sxs/prior Treatment) HPI Patient presents with concern of ongoing cough, myalgia, fever, nausea. Symptoms have been present for about 1 week, worse with the past few days, but actually better today than yesterday. No focal pain, though the myalgias are diffuse. No headache, confusion, disorientation, vomiting today. No diarrhea. Patient does have a history of migraine headache. But no headache today. Patient is a notable history of travel to [place] 2 [place] ago She was in an [place]. Patient was here 2 days ago for evaluation. She continues to have symptoms, returns for additional evaluation.  Past Medical History  Diagnosis Date  . Anxiety   . Infection 12/2012    URI-SEEN AT [facility]  . Headache(784.0)     MIGRAINES;OTC   Past Surgical History  Procedure Laterality Date  . Tonsillectomy  2009   Family History  Problem Relation Age of Onset  . Other Neg Hx   . Hyperlipidemia Mother   . Diabetes Father   . Heart disease Father    History  Substance Use Topics  . Smoking status: Former Smoker -- 0.05 packs/day    Types: Cigarettes  . Smokeless tobacco: Never Used  . Alcohol Use: No   OB History    Gravida Para Term Preterm AB TAB SAB Ectopic Multiple Living   1 1 1       1      Review of Systems  Constitutional:       Per HPI, otherwise negative  HENT:       Per HPI, otherwise negative  Respiratory:       Per HPI, otherwise negative  Cardiovascular:       Per HPI, otherwise negative  Gastrointestinal: Negative for vomiting.  Endocrine:       Negative aside from HPI  Genitourinary:       Neg aside from HPI   Musculoskeletal:       Per HPI, otherwise negative  Skin: Negative.    Neurological: Negative for syncope.      Allergies  Review of patient's allergies indicates no known allergies.  Home Medications   Prior to Admission medications   Medication Sig Start Date End Date Taking? Authorizing Provider  aspirin-acetaminophen-caffeine (EXCEDRIN MIGRAINE) [phone] MG per tablet Take 1 tablet by mouth every 6 (six) hours as needed for headache.    Historical Provider, MD  ferrous sulfate 325 (65 FE) MG tablet Take 1 tablet (325 mg total) by mouth daily with breakfast. Patient not taking: Reported on 03/09/2015 05/14/13   Haroldine LawsJennifer Oxley, CNM  ibuprofen (ADVIL,MOTRIN) 600 MG tablet Take 1 tablet (600 mg total) by mouth every 6 (six) hours. Patient not taking: Reported on 03/09/2015 05/14/13   Haroldine LawsJennifer Oxley, CNM  ondansetron (ZOFRAN ODT) 4 MG disintegrating tablet Take 1 tablet (4 mg total) by mouth every 8 (eight) hours as needed for nausea or vomiting. 03/09/15   Layla MawKristen N Ward, DO  oseltamivir (TAMIFLU) 75 MG capsule Take 1 capsule (75 mg total) by mouth every 12 (twelve) hours. 03/09/15   Kristen N Ward, DO  oxyCODONE-acetaminophen (PERCOCET/ROXICET) 5-325 MG per tablet Take 1-2 tablets by mouth every 4 (four) hours as needed. Patient not taking: Reported on 03/09/2015 05/14/13   Haroldine LawsJennifer Oxley, CNM   BP 96/69  mmHg  Pulse 108  Temp(Src) 103 F (39.4 C) (Oral)  Resp 18  SpO2 100%  LMP 03/04/2015 Physical Exam  Constitutional: She is oriented to person, place, and time. She appears well-developed and well-nourished. No distress.  HENT:  Head: Normocephalic and atraumatic.  Eyes: Conjunctivae and EOM are normal.  Cardiovascular: Regular rhythm.  Tachycardia present.   Pulmonary/Chest: No stridor. She has decreased breath sounds.  Abdominal: She exhibits no distension.  Musculoskeletal: She exhibits no edema.  Neurological: She is alert and oriented to person, place, and time. No cranial nerve deficit.  Skin: Skin is warm and dry.  Psychiatric: She has a  normal mood and affect.  Nursing note and vitals reviewed.   ED Course  Procedures (including critical care time) Labs Review Labs Reviewed  COMPREHENSIVE METABOLIC PANEL - Abnormal; Notable for the following:    BUN <5 (*)    All other components within normal limits  MALARIA SMEAR  CULTURE, BLOOD (ROUTINE X 2)  CULTURE, BLOOD (ROUTINE X 2)  CBC WITH DIFFERENTIAL/PLATELET  I-STAT CG4 LACTIC ACID, ED    Imaging Review Dg Chest 2 View  03/11/2015   CLINICAL DATA:  Generalized body aches  EXAM: CHEST  2 VIEW  COMPARISON:  August 18, 2012  FINDINGS: The heart size and mediastinal contours are within normal limits. There is left upper lobe pneumonia. The right lung is clear. There is no pleural effusion. The visualized skeletal structures are unremarkable.  IMPRESSION: Left upper lobe pneumonia.   Electronically Signed   By: Sherian Rein M.D.   On: 03/11/2015 20:32    I reviewed the chart including a visit 2 days ago, with respiratory illness.   I reviewed the results (including imaging as performed), agree with the interpretation  On repeat exam the patient appears better.  We reviewed all findings. She does still have some chest tightness.  Malaria smear is going to take additional days, but w XR e/o PNA, and no travel to areas w substantial malaria.  MDM  She presents after recent travel to [place], now with myalgia, fever, chills, is found to have pneumonia.  Patient tolerated initial antibiotics, fluids well, with decreasing fever, heart rate No evidence for sepsis or bacteremia, though the patient has borderline low blood pressure, mildly elevated heart rate.     Gerhard Munch, MD 03/11/15 2358

## 2015-03-11 NOTE — ED Notes (Signed)
The pt is c/o body aches nv  Headache.  Hx of migraine headaches.  She was seen here 2 days ago and diagnosed with the flu.  lmp one week.  She was in [place] 2 months ago

## 2015-03-11 NOTE — ED Notes (Signed)
The pt took tylenol 2 hours ago

## 2015-03-12 MED ORDER — LEVOFLOXACIN 500 MG PO TABS: 500.0000 mg | ORAL_TABLET | Freq: Every day | ORAL | Status: DC

## 2015-03-12 MED ORDER — IBUPROFEN 600 MG PO TABS: 600.0000 mg | ORAL_TABLET | Freq: Three times a day (TID) | ORAL | Status: AC

## 2015-03-12 NOTE — Discharge Instructions (Signed)
As discussed, tonight's evaluation has demonstrated pneumonia, or an infection of your lungs.  Is important to take all medication as directed, and monitor your condition carefully. Return here for concerning changes in your condition.  A malaria test is still being processed. You will be made aware of abnormal results.

## 2015-03-12 NOTE — ED Notes (Signed)
Pt A&OX4, ambulatory at d/c with steady gait, NAD 

## 2015-03-18 LAB — CULTURE, BLOOD (ROUTINE X 2)
CULTURE: NO GROWTH
CULTURE: NO GROWTH

## 2015-03-24 LAB — MALARIA SMEAR

## 2015-06-08 ENCOUNTER — Emergency Department (HOSPITAL_COMMUNITY)
Admission: EM | Admit: 2015-06-08 | Discharge: 2015-06-08 | Disposition: A | Discharge status: Home / Self Care | Payer: Self-pay | Attending: Emergency Medicine | Admitting: Emergency Medicine

## 2015-06-08 ENCOUNTER — Emergency Department (HOSPITAL_COMMUNITY): Payer: Self-pay

## 2015-06-08 ENCOUNTER — Emergency Department (HOSPITAL_COMMUNITY): Payer: Medicaid Other

## 2015-06-08 ENCOUNTER — Encounter (HOSPITAL_COMMUNITY): Payer: Self-pay | Admitting: Emergency Medicine

## 2015-06-08 DIAGNOSIS — R05 Cough: Secondary | ICD-10-CM | POA: Insufficient documentation

## 2015-06-08 DIAGNOSIS — Z8709 Personal history of other diseases of the respiratory system: Secondary | ICD-10-CM | POA: Insufficient documentation

## 2015-06-08 DIAGNOSIS — Z79899 Other long term (current) drug therapy: Secondary | ICD-10-CM | POA: Insufficient documentation

## 2015-06-08 DIAGNOSIS — R059 Cough, unspecified: Secondary | ICD-10-CM

## 2015-06-08 DIAGNOSIS — N12 Tubulo-interstitial nephritis, not specified as acute or chronic: Secondary | ICD-10-CM | POA: Insufficient documentation

## 2015-06-08 DIAGNOSIS — Z3202 Encounter for pregnancy test, result negative: Secondary | ICD-10-CM | POA: Insufficient documentation

## 2015-06-08 DIAGNOSIS — Z8659 Personal history of other mental and behavioral disorders: Secondary | ICD-10-CM | POA: Insufficient documentation

## 2015-06-08 DIAGNOSIS — Z8679 Personal history of other diseases of the circulatory system: Secondary | ICD-10-CM | POA: Insufficient documentation

## 2015-06-08 DIAGNOSIS — Z87891 Personal history of nicotine dependence: Secondary | ICD-10-CM | POA: Insufficient documentation

## 2015-06-08 LAB — COMPREHENSIVE METABOLIC PANEL
ALK PHOS: 51 U/L (ref 38–126)
ALT: 14 U/L (ref 14–54)
AST: 19 U/L (ref 15–41)
Albumin: 4.1 g/dL (ref 3.5–5.0)
Anion gap: 9 (ref 5–15)
BILIRUBIN TOTAL: 0.4 mg/dL (ref 0.3–1.2)
BUN: 9 mg/dL (ref 6–20)
CO2: 28 mmol/L (ref 22–32)
Calcium: 9.5 mg/dL (ref 8.9–10.3)
Chloride: 101 mmol/L (ref 101–111)
Creatinine, Ser: 0.62 mg/dL (ref 0.44–1.00)
GFR calc non Af Amer: >60 mL/min (ref >60)
Glucose, Bld: 98 mg/dL (ref 65–99)
Potassium: 3.9 mmol/L (ref 3.5–5.1)
SODIUM: 138 mmol/L (ref 135–145)
Total Protein: 8.9 g/dL — ABNORMAL HIGH (ref 6.5–8.1)

## 2015-06-08 LAB — CBC
HCT: 37.3 % (ref 36.0–46.0)
Hemoglobin: 12.4 g/dL (ref 12.0–15.0)
MCH: 27.6 pg (ref 26.0–34.0)
MCHC: 33.2 g/dL (ref 30.0–36.0)
MCV: 83.1 fL (ref 78.0–100.0)
PLATELETS: 228 10*3/uL (ref 150–400)
RBC: 4.49 MIL/uL (ref 3.87–5.11)
RDW: 12.9 % (ref 11.5–15.5)
WBC: 6.8 10*3/uL (ref 4.0–10.5)

## 2015-06-08 LAB — URINALYSIS, ROUTINE W REFLEX MICROSCOPIC
Bilirubin Urine: NEGATIVE
Glucose, UA: NEGATIVE mg/dL
NITRITE: POSITIVE — AB
PH: 6 (ref 5.0–8.0)
Protein, ur: 30 mg/dL — AB
SPECIFIC GRAVITY, URINE: 1.023 (ref 1.005–1.030)
UROBILINOGEN UA: 0.2 mg/dL (ref 0.0–1.0)

## 2015-06-08 LAB — URINE MICROSCOPIC-ADD ON

## 2015-06-08 LAB — D-DIMER, QUANTITATIVE (NOT AT ARMC): D-Dimer, Quant: 0.56 ug/mL-FEU — ABNORMAL HIGH (ref 0.00–0.48)

## 2015-06-08 LAB — PREGNANCY, URINE: Preg Test, Ur: NEGATIVE

## 2015-06-08 LAB — I-STAT CG4 LACTIC ACID, ED
LACTIC ACID, VENOUS: 0.7 mmol/L (ref 0.5–2.0)
Lactic Acid, Venous: 0.86 mmol/L (ref 0.5–2.0)

## 2015-06-08 MED ORDER — SODIUM CHLORIDE 0.9 % IV BOLUS (SEPSIS)
1000.0000 mL | Freq: Once | INTRAVENOUS | Status: AC
  Administered 2015-06-08: 1000 mL via INTRAVENOUS

## 2015-06-08 MED ORDER — PROCHLORPERAZINE EDISYLATE 5 MG/ML IJ SOLN
10.0000 mg | Freq: Once | INTRAMUSCULAR | Status: AC
  Administered 2015-06-08: 10 mg via INTRAVENOUS
  Filled 2015-06-08: qty 2

## 2015-06-08 MED ORDER — CEPHALEXIN 500 MG PO CAPS: 500.0000 mg | ORAL_CAPSULE | Freq: Three times a day (TID) | ORAL | Status: DC

## 2015-06-08 MED ORDER — METHYLPREDNISOLONE SODIUM SUCC 125 MG IJ SOLR
125.0000 mg | Freq: Once | INTRAMUSCULAR | Status: AC
Start: 2015-06-08 — End: 2015-06-08
  Administered 2015-06-08: 125 mg via INTRAVENOUS
  Filled 2015-06-08: qty 2

## 2015-06-08 MED ORDER — KETOROLAC TROMETHAMINE 30 MG/ML IJ SOLN
30.0000 mg | Freq: Once | INTRAMUSCULAR | Status: AC
  Administered 2015-06-08: 30 mg via INTRAVENOUS
  Filled 2015-06-08: qty 1

## 2015-06-08 MED ORDER — DEXTROSE 5 % IV SOLN
1.0000 g | INTRAVENOUS | Status: DC
  Administered 2015-06-08: 1 g via INTRAVENOUS
  Filled 2015-06-08: qty 10

## 2015-06-08 MED ORDER — METOCLOPRAMIDE HCL 5 MG/ML IJ SOLN
10.0000 mg | Freq: Once | INTRAMUSCULAR | Status: AC
  Administered 2015-06-08: 10 mg via INTRAVENOUS
  Filled 2015-06-08: qty 2

## 2015-06-08 MED ORDER — DIPHENHYDRAMINE HCL 50 MG/ML IJ SOLN
25.0000 mg | Freq: Once | INTRAMUSCULAR | Status: AC
  Administered 2015-06-08: 25 mg via INTRAVENOUS
  Filled 2015-06-08: qty 1

## 2015-06-08 MED ORDER — IOHEXOL 350 MG/ML SOLN
100.0000 mL | Freq: Once | INTRAVENOUS | Status: AC | PRN
  Administered 2015-06-08: 100 mL via INTRAVENOUS

## 2015-06-08 NOTE — ED Notes (Signed)
Pt ambulatory with steady gait to void in BR, CC urine spec obtained and sent to lab.

## 2015-06-08 NOTE — ED Notes (Signed)
Patient transported to CT 

## 2015-06-08 NOTE — ED Provider Notes (Signed)
CSN: [id number]     Arrival date & time 06/08/15  1311 History   First MD Initiated Contact with Patient 06/08/15 1456     Chief Complaint  Patient presents with  . Flank Pain    L groin pain radiating to L flank x2 days  . Dysuria    x2 days  . Generalized Body Aches    x2 days     (Consider location/radiation/quality/duration/timing/severity/associated sxs/prior Treatment) HPI   Blood pressure 93/62, pulse 100, temperature 98.5 F (36.9 C), temperature source Oral, resp. rate 16, weight 130 lb (58.968 kg), last menstrual period 05/15/2015, SpO2 100 %, unknown if currently breastfeeding.  Katrina Blackwell is a 29 y.o. female past medical history significant for migraines complaining of dysuria, hematuria, urinary frequency onset 3 days ago with associated fever (Tmax 103 measured 3 days ago with left groin and left flank pain. Patient also states that she "hurts everywhere." She denies vomiting, change in bowel habits, abnormal vaginal discharge, focal abdominal pain. On review of systems she notes an exacerbation of her chronic right temporal migraine which she states is typical, no pain medication taken prior to arrival, patient took Excedrin yesterday with little relief.  Past Medical History  Diagnosis Date  . Anxiety   . Infection 12/2012    URI-SEEN AT [facility]  . Headache(784.0)     MIGRAINES;OTC   Past Surgical History  Procedure Laterality Date  . Tonsillectomy  2009   Family History  Problem Relation Age of Onset  . Other Neg Hx   . Hyperlipidemia Mother   . Diabetes Father   . Heart disease Father    History  Substance Use Topics  . Smoking status: Former Smoker -- 0.05 packs/day    Types: Cigarettes  . Smokeless tobacco: Never Used  . Alcohol Use: No   OB History    Gravida Para Term Preterm AB TAB SAB Ectopic Multiple Living   Review of Systems  10 systems reviewed and found to be negative, except as noted in the HPI.   Allergies   Review of patient's allergies indicates no known allergies.  Home Medications   Prior to Admission medications   Medication Sig Start Date End Date Taking? Authorizing Provider  ferrous sulfate 325 (65 FE) MG tablet Take 1 tablet (325 mg total) by mouth daily with breakfast. Patient not taking: Reported on 03/09/2015 05/14/13   Haroldine Laws, CNM  levofloxacin (LEVAQUIN) 500 MG tablet Take 1 tablet (500 mg total) by mouth daily. 03/12/15   Gerhard Munch, MD  ondansetron (ZOFRAN ODT) 4 MG disintegrating tablet Take 1 tablet (4 mg total) by mouth every 8 (eight) hours as needed for nausea or vomiting. 03/09/15   Layla Maw Ward, DO  oseltamivir (TAMIFLU) 75 MG capsule Take 1 capsule (75 mg total) by mouth every 12 (twelve) hours. 03/09/15   Kristen N Ward, DO  oxyCODONE-acetaminophen (PERCOCET/ROXICET) 5-325 MG per tablet Take 1-2 tablets by mouth every 4 (four) hours as needed. Patient not taking: Reported on 03/09/2015 05/14/13   Haroldine Laws, CNM   BP 93/62 mmHg  Pulse 100  Temp(Src) 98.5 F (36.9 C) (Oral)  Resp 16  Wt 130 lb (58.968 kg)  SpO2 100%  LMP 05/15/2015 Physical Exam  Constitutional: She is oriented to person, place, and time. She appears well-developed and well-nourished. No distress.  Pt speaking in high-pitched, childlike voice  HENT:  Head: Normocephalic and atraumatic.  Mouth/Throat:  Oropharynx is clear and moist.  Eyes: Conjunctivae and EOM are normal. Pupils are equal, round, and reactive to light.  Cardiovascular: Normal rate, regular rhythm and intact distal pulses.   Pulmonary/Chest: Effort normal and breath sounds normal. No stridor. No respiratory distress. She has no wheezes. She has no rales. She exhibits no tenderness.  Abdominal: Soft. Bowel sounds are normal. She exhibits no distension and no mass. There is no tenderness. There is no rebound and no guarding.  Musculoskeletal: Normal range of motion.  Neurological: She is alert and oriented to person,  place, and time.  Psychiatric: She has a normal mood and affect.  Nursing note and vitals reviewed.   ED Course  Procedures (including critical care time) Labs Review Labs Reviewed  COMPREHENSIVE METABOLIC PANEL - Abnormal; Notable for the following:    Total Protein 8.9 (*)    All other components within normal limits  CBC  PREGNANCY, URINE  URINALYSIS, ROUTINE W REFLEX MICROSCOPIC (NOT AT [facility])    Imaging Review No results found.   EKG Interpretation None      MDM   Final diagnoses:  Cough  Pyelonephritis    Filed Vitals:   06/08/15 1322  BP: 93/62  Pulse: 100  Temp: 98.5 F (36.9 C)  TempSrc: Oral  Resp: 16  Weight: 130 lb (58.968 kg)  SpO2: 100%    Medications  sodium chloride 0.9 % bolus 1,000 mL (not administered)  prochlorperazine (COMPAZINE) injection 10 mg (not administered)  metoCLOPramide (REGLAN) injection 10 mg (not administered)  methylPREDNISolone sodium succinate (SOLU-MEDROL) 125 mg/2 mL injection 125 mg (not administered)  ketorolac (TORADOL) 30 MG/ML injection 30 mg (not administered)    Katrina Blackwell is a pleasant 29 y.o. female presenting with dysuria, hematuria, left groin radiating to left flank pain with reported fever, exacerbation of chronic migraine and diffuse myalgia. Vital signs with no abnormalities, blood work also normal. Patient states she cannot produce a urine sample, will give her fluid bolus, check lactate, give her migraine cocktail and reassess.  Patient reports improvement with headache cocktail, she remains tachycardic and she is reporting palpitations, physical exam is not consistent with DVT however, will bolus and rechecked. She will be given Rocephin for her UTI/pyelonephritis. Urine cultures obtained. Her chest x-rays negative, lactic acid is normal, dimer is mildly elevated however her CT NGO does not show any PE.  Evaluation does not show pathology that would require ongoing emergent intervention or inpatient  treatment. Pt is hemodynamically stable and mentating appropriately. Discussed findings and plan with patient/guardian, who agrees with care plan. All questions answered. Return precautions discussed and outpatient follow up given.   Discharge Medication List as of 06/08/2015 10:08 PM    START taking these medications   Details  cephALEXin (KEFLEX) 500 MG capsule Take 1 capsule (500 mg total) by mouth 3 (three) times daily., Starting 06/08/2015, Until Discontinued, Print             Katrina Emeryicole Mersadies Petree, PA-C 06/09/15 0136  Richardean Canalavid H Yao, MD 06/09/15 [phone]

## 2015-06-08 NOTE — ED Notes (Signed)
Pt remains sleeping soundly on stretcher, RR even/un-lab, skin PWD, MAEI, self repositioning for comfort.  [facility]  Denies further needs/complaints at this time.  NAD.

## 2015-06-08 NOTE — ED Notes (Signed)
US at bedside

## 2015-06-08 NOTE — ED Notes (Signed)
Bed: WA04 Expected date:  Expected time:  Means of arrival:  Comments: EMS-abdominal pain 

## 2015-06-08 NOTE — ED Notes (Signed)
Pt to radiology, will collect I-Stat when pt returns.

## 2015-06-08 NOTE — ED Notes (Signed)
Pt to CXR.

## 2015-06-08 NOTE — ED Notes (Signed)
Pt sleeping soundly on stretcher, arousable to verbal stim, denies pain or other needs/complaints at this time.  NAD.

## 2015-06-08 NOTE — ED Notes (Signed)
Initial Contact - pt A+Ox4, reports c/o L groin pain radiating into L flank pain, dysuria and generalized body aches x2 days.  Skin PWD.  MAEI.  Speaking full/clear sentences, rr even/un-lab.  Appears uncomfortable.  NAD.

## 2015-06-08 NOTE — ED Notes (Signed)
Pt talking on phone, refusing to go to xray at this time, pt informed that she needs to go to radiology at this time.

## 2015-06-08 NOTE — ED Notes (Signed)
Pt ambulatory with steady gait to BR, sts unable to provide sample at this time.  Will re-attempt.

## 2015-06-08 NOTE — Discharge Instructions (Signed)
Do not hesitate to return to the emergency room for any new, worsening or concerning symptoms.  Please obtain primary care using resource guide below. Let them know that you were seen in the emergency room and that they will need to obtain records for further outpatient management.   Pyelonephritis, Adult Pyelonephritis is a kidney infection. A kidney infection can happen quickly, or it can last for a long time. HOME CARE   Take your medicine (antibiotics) as told. Finish it even if you start to feel better.  Keep all doctor visits as told.  Drink enough fluids to keep your pee (urine) clear or pale yellow.  Only take medicine as told by your doctor. GET HELP RIGHT AWAY IF:   You have a fever or lasting symptoms for more than 2-3 days.  You have a fever and your symptoms suddenly get worse.  You cannot take your medicine or drink fluids as told.  You have chills and shaking.  You feel very weak or pass out (faint).  You do not feel better after 2 days. MAKE SURE YOU:  Understand these instructions.  Will watch your condition.  Will get help right away if you are not doing well or get worse. Document Released: 12/13/2004 Document Revised: 05/06/2012 Document Reviewed: 04/25/2011 [facility] Patient Information 2015 [place], [place]. This information is not intended to replace advice given to you by your health care provider. Make sure you discuss any questions you have with your health care provider.   Emergency Department Resource Guide 1) Find a Doctor and Pay Out of Pocket Although you won't have to find out who is covered by your insurance plan, it is a good idea to ask around and get recommendations. You will then need to call the office and see if the doctor you have chosen will accept you as a new patient and what types of options they offer for patients who are self-pay. Some doctors offer discounts or will set up payment plans for their patients who do not have  insurance, but you will need to ask so you aren't surprised when you get to your appointment.  2) Contact Your Local Health Department Not all health departments have doctors that can see patients for sick visits, but many do, so it is worth a call to see if yours does. If you don't know where your local health department is, you can check in your phone book. The CDC also has a tool to help you locate your state's health department, and many state websites also have listings of all of their local health departments.  3) Find a Walk-in Clinic If your illness is not likely to be very severe or complicated, you may want to try a walk in clinic. These are popping up all over the country in pharmacies, drugstores, and shopping centers. They're usually staffed by nurse practitioners or physician assistants that have been trained to treat common illnesses and complaints. They're usually fairly quick and inexpensive. However, if you have serious medical issues or chronic medical problems, these are probably not your best option.  No Primary Care Doctor: - Call Health Connect at  [phone] - they can help you locate a primary care doctor that  accepts your insurance, provides certain services, etc. - Physician Referral Service- [phone]  Chronic Pain Problems: Organization         Address  Phone   Notes  [facility]  [phone] Patients need to be referred by their primary  care doctor.   Medication Assistance: Organization         Address  Phone   Notes  [facility] Medication [facility] [address] [place] [place], [place] [address] [phone] --Must be a resident of [facility] -- Must have NO insurance coverage whatsoever (no Medicaid/ Medicare, etc.) -- The pt. MUST have a primary care doctor that directs their care regularly and follows them in the community   [facility]  [phone]   [employer]  [phone]    Agencies that provide  inexpensive medical care: Organization         Address  Phone   Notes  [facility]  [phone]   [facility]    [phone]   [facility] [address] [place], [place] [address] [phone]   [facility] [address] [place] [place] [phone]   Planned Parenthood    [phone]   [facility]    [phone]   Community Health and [facility]  [address], [place] Phone:  [phone], Fax:  [phone] Hours of Operation:  9 am - 6 pm, M-F.  Also accepts Medicaid/Medicare and self-pay.  [facility]  [address], [place] Phone: [phone], Fax: [phone]. Hours of Operation:  8:30 am - 5:30 pm, M-F.  Also accepts Medicaid and self-pay.  [facility] [place] [address], [place] Phone: [phone]   [facility] [address] Natasha Bence [place], [place] [phone], Ext. [phone] Mondays & Thursdays: 7-9 AM.  First 15 patients are seen on a first come, first serve basis.    Medicaid-accepting [facility] Providers:  Organization         Address  Phone   Notes  [facility] [address],  [phone] Also accepts self-pay patients.  [facility] [address] Laurell Josephs [place], [place]  [phone]   [facility] [address], [place] [phone]   [facility] Family [facility] [address] [place] [phone]   Renaye Rakers [address], [place]   [phone] Only accepts [place] Access [place] patients after they have their name applied to their card.   Self-Pay (no insurance) in [facility]:  Organization         Address  Phone   Notes  Sickle Cell Patients, [facility] [address] [place], [place] [phone]   [facility] [address] [place], [place] [phone]   [facility] [place]  [address], [place] [phone]   [facility]/Dr. Osei-Bonsu  [address], [place] or [address], [place] [phone] Phone number for both [place] and [place] locations is the same.  [facility]gent Medical and [facility] [address], [place] [phone]   [facility] [address], [place] or [address] [phone]   [facility] [address] [place] [phone], phone; [phone], fax Sees patients 1st and 3rd Saturday of every month.  Must not qualify for public or private insurance (i.e. Medicaid, Medicare, [place] Health Choice, Veterans' Benefits)  Household income should be no more than 200% of  the poverty level The clinic cannot treat you if you are pregnant or think you are pregnant  Sexually transmitted diseases are not treated at the clinic.    Dental Care: Organization         Address  Phone  Notes  [facility] 352 Department of [facility] [address] [place], [place] [phone] Accepts children up to age 63 who are enrolled in [place] or [place] Health Choice; pregnant women with a Medicaid card; and children who have applied for Medicaid or [place] Health Choice, but were declined, whose parents can pay a reduced fee at time of service.  [facility] Department of [facility]  [address], [place] [phone] Accepts children up to age 77 who are enrolled in [place] or [place] Health Choice; pregnant women with a Medicaid card; and children who have applied for Medicaid or [place] Health Choice, but were declined, whose parents can pay a reduced fee at time of service.  [facility]  [address] [place], [place] [phone] Patients are seen by appointment only. Walk-ins are  not accepted. [facility] will see patients 18 years of age and older. Monday - Tuesday (8am-5pm) Most Wednesdays (8:30-5pm) $30 per visit, cash only  [facility] Adult Dental Access PROGRAM  [address], [facility] [phone] Patients are seen by appointment only. Walk-ins are not accepted. [facility] will see patients 32 years of age and older. One Wednesday Evening (Monthly: Volunteer Based).  $30 per visit, cash only  [employer]  [phone] for adults; Children under age 43, call Graduate Pediatric Dentistry at [phone]. Children aged 73-14, please call [phone] to request a pediatric application.  Dental services are provided in all areas of dental care including fillings, crowns and bridges, complete and partial dentures, implants, gum treatment, root canals, and extractions. Preventive care is also provided. Treatment is provided to both adults and children. Patients are selected via a lottery and there is often a waiting list.   [facility] [address] [place]  [phone] www.drcivils.com   [facility] [address] [place], [place] [phone], Ext. [phone] Second and Fourth Thursday of each month, opens at 6:30 AM; Clinic ends at 9 AM.  Patients are seen on a first-come first-served basis, and a limited number are seen during each clinic.   [facility]  [address] Ether Griffins [place], [place] [phone]   Eligibility Requirements You must have lived in [place], [place], or [place] counties for at least the last three months.   You cannot be eligible for state or federal sponsored [place], including [employer], [place], or [employer].   You generally cannot be eligible for healthcare insurance through your employer.    How to apply: Eligibility screenings are held every Tuesday and Wednesday afternoon from 1:00 pm until 4:00 pm. You do not need an appointment for  the interview!  [facility] [address] [place], [place] [phone]   [facility] Health Department  [phone]   [facility] Health Department  [phone]   [facility] Health Department  [phone]    Behavioral Health Resources in the Community: Intensive Outpatient Programs Organization         Address  Phone  Notes  [facility] [address], [place], [place] [phone]   [facility] Outpatient [address], [place], [place] [phone]   [facility] [address] [place], [place]  [phone]   [facility] [phone] [place] [address],  [place], [place] [phone] or [phone]   Substance Abuse Resources Organization         Address  Phone  Notes  [facility]  [phone]   [facility]  [phone]   [facility] [place]  [phone]   Floydene Flock  [phone]   Residential & Outpatient Substance Abuse Program  [phone]   Psychological Services Organization         Address  Phone  Notes  [facility] Behavioral Health  [phone]   [facility]  [phone]   [facility] Mental Health [address], [place] [phone] or [phone]    Mobile Crisis Teams Organization         Address  Phone  Notes  [facility], Mobile Crisis Care Unit  [phone]   [facility]  [address] [place], [place] [phone]   Doristine Locks [address] [place] [phone]    Self-Help/Support Groups Organization         Address  Phone             Notes  [facility] - variety of support groups  [phone]- [phone] Call for more information  Narcotics Anonymous (NA), [facility] [address], [employer] [place]  2 meetings at this location   Statistician         Address  Phone  Notes  [facility]  [address],    [place]  [phone]   [facility]  [address], [place] [address], [place], [place] [phone]   [facility] [address] [place], [place] [phone] Admissions: 8am-3pm M-F  [facility] [address],    [place], [place] [phone]   [facility] [address] [place], [place], [place] [phone]   [facility] [address],  [place], [place] [phone]   [facility] - Intensive Outpatient [address] Laurell Joseph[address], [place], [place] [phone]   [facility] ([facility] [address] [place]  [place], [place] [phone] or [phone]   [facility] ([facility]) [address], [place], [place] [phone] Accepts Medicaid  [facility] [address]  [place] [phone] Substance Abuse/Addiction Treatment   [facility] Organization         Address  Phone  Notes  [facility]  [phone]   Angie Fava, PhD [address] Ervin Knack [place], [place]   [phone] or [phone]   [facility] Behavioral   [address] [place], [place] [phone]   [facility] [address], [place], [place] [phone] Insurance/Medicaid/sponsorship through [facility] and Families [address]                                    [place], [place] [phone] Therapy/tele-psych/case  [facility] [address], [place] [phone]    Dr. Lolly Mustache  [phone]   [facility] [facility]  [employer] [facility] Dept. 1) [address], [place] 2) [address] [place] 3)  [address], Art therapist (  [phone]  [phone]   [facility] Child Abuse Hotline [phone] or [phone] (After Hours)

## 2015-06-08 NOTE — ED Notes (Signed)
PER EMS - pt from home with c/o L groin pain rad into L flank, x5 days, pt reports taking amoxicillin x5 days and not feeling better.  +dysuria.  A+Ox4.  Ambulatory.

## 2015-06-08 NOTE — Progress Notes (Signed)
CM spoke with pt who confirms uninsured [place] resident with [place] pcp.  CM discussed and provided written information for uninsured accepting pcps, discussed the importance of pcp vs EDP services for f/u care, www.needymeds.org, www.goodrx.com, discounted pharmacies and other [place] resources such as [facility] , [facility], affordable care act, financial assistance, uninsured dental services, [place] med assist, DSS and  health department  Reviewed resources for [place] uninsured accepting pcps like [facility] [place], family medicine at [address], [facility] [place], [facility], local urgent care centers, [facility], [facility], general medical clinics, [facility], [facility] plus others, medication resources, [facility] out patient pharmacies and housing Pt voiced understanding and appreciation of resources provided   Provided [facility] contact information Pt agreed to a referral Cm completed referral Pt to be contact by [facility] clinical liason  

## 2015-06-11 LAB — URINE CULTURE: Culture: >=100000

## 2015-06-12 ENCOUNTER — Telehealth: Payer: Self-pay | Admitting: Emergency Medicine

## 2015-06-12 NOTE — Telephone Encounter (Signed)
Post ED Visit - Positive Culture Follow-up: Successful Patient Follow-Up  Culture assessed and recommendations reviewed by:  Celedonio Miyamoto, Pharm.D., BCPS-AQ ID  Georgina Pillion, Pharm.D., BCPS  [place], [place].D., BCPS, AAHIVP  Estella Husk, Pharm.D., BCPS, AAHIVP  Tegan Magsam, Pharm.D.  Tennis Must, [place].D.  Positive Urine culture   Patient discharged without antimicrobial prescription and treatment is now indicated  Organism is resistant to prescribed ED discharge antimicrobial  Patient with positive blood cultures  Changes discussed with ED provider: Trixie Dredge PA New antibiotic prescription:  Macrobid 100 mg PO BID x ten days  Unable to contact patient by phone, number provided in epic invalid, letter sent   Jiles Harold 06/12/2015, 3:28 PM

## 2015-06-12 NOTE — Progress Notes (Signed)
ED Antimicrobial Stewardship Positive Culture Follow Up   Katrina Blackwell is an 29 y.o. female who presented to [facility] on [facility]/08/2015 with a chief complaint of  Chief Complaint  Patient presents with  . Flank Pain    L groin pain radiating to L flank x2 days  . Dysuria    x2 days  . Generalized Body Aches    x2 days    Recent Results (from the past 720 hour(s))  Urine culture     Status: None   Collection Time: 06/08/15  4:25 PM  Result Value Ref Range Status   Specimen Description URINE, RANDOM  Final   Special Requests NONE  Final   Culture   Final    >=100,000 COLONIES/mL ESCHERICHIA COLI Confirmed Extended Spectrum Beta-Lactamase Producer (ESBL) Performed at [facility]    Report Status 06/11/2015 FINAL  Final   Organism ID, Bacteria ESCHERICHIA COLI  Final      Susceptibility   Escherichia coli - MIC*    AMPICILLIN >=32 RESISTANT Resistant     CEFAZOLIN >=64 RESISTANT Resistant     CEFTRIAXONE >=64 RESISTANT Resistant     CIPROFLOXACIN >=4 RESISTANT Resistant     GENTAMICIN <=1 SENSITIVE Sensitive     IMIPENEM <=0.25 SENSITIVE Sensitive     NITROFURANTOIN <=16 SENSITIVE Sensitive     TRIMETH/SULFA >=320 RESISTANT Resistant     AMPICILLIN/SULBACTAM >=32 RESISTANT Resistant     PIP/TAZO 64 INTERMEDIATE Intermediate     * >=100,000 COLONIES/mL ESCHERICHIA COLI     Treated with Keflex, organism resistant to prescribed antimicrobial  Patient discharged originally without antimicrobial agent and treatment is now indicated  23 YOF with ESBL UTI - symptomatic of UTI with flank pain, dysuria, fevers.   New antibiotic prescription: D/C Keflex and start Macrobid 100 mg bid x 10 days. If symptoms persist with new antibiotic - come back in for evaluation. Can consider getting a repeat urine culture 1 week after treatment completed to ensure clear.  ED Provider: Trixie Dredge, PA-C   [name] 06/12/2015, 12:48 PM Infectious Diseases Pharmacist Phone#  [phone]

## 2015-07-11 ENCOUNTER — Telehealth (HOSPITAL_COMMUNITY): Payer: Self-pay

## 2015-07-11 NOTE — Telephone Encounter (Signed)
Unable to contact pt by mail or telephone. Unable to communicate lab results or treatment changes. 

## 2016-11-19 NOTE — L&D Delivery Note (Signed)
Patient is a 31 y.o. now [id number] s/p NSVD at 5370w2d, who was admitted for PPROM at 830645am today. S/p IOL with IV Pitocin.  Delivery Note At 10:39 PM a viable female was delivered via Vaginal, Spontaneous Delivery (Presentation: ROA ).  APGAR: 8, 9; weight  pending Placenta status: intact.  Cord:  3-vessel  Anesthesia: Epidural Episiotomy: Median Lacerations: 2nd degree Perineal Suture Repair: 3.0 vicryl Est. Blood Loss (mL): 200  Mother complete and pushing. Due prior circumcision, unable to deliver fetal head through small introitus. Dr. Debroah LoopArnold evaluated and did median episiotomy. With next maternal push, head delivered ROA. Nuchal cord x1  Present, and reduced, thens shoulders and body delivered in usual fashion. Infant placed on mother's abdomen, dried, bulb suctioned, and stimulated, and noted to have strong cry and good tone. Cord clamped x 2 after 1-minute delay, and cut by family member. Cord blood drawn. Placenta delivered spontaneously with gentle cord traction. Fundus firm with massage and Pitocin. Perineum inspected and found to have 2nd degree laceration from episiotomy, also small periurethral tear, which were both repaired with 3-0 vycril with good hemostasis achieved.  Mom to postpartum.  Baby to Couplet care / Skin to Skin.  Raynelle FanningJulie P. Brigg Cape, MD OB Fellow 09/09/17, 11:27 PM

## 2016-12-03 ENCOUNTER — Ambulatory Visit (INDEPENDENT_AMBULATORY_CARE_PROVIDER_SITE_OTHER): Payer: Self-pay | Admitting: Physician Assistant

## 2016-12-03 VITALS — BP 102/66 | HR 101 | Temp 98.3°F | Resp 18 | Ht 64.0 in | Wt 126.0 lb

## 2016-12-03 DIAGNOSIS — R070 Pain in throat: Secondary | ICD-10-CM

## 2016-12-03 DIAGNOSIS — Z20818 Contact with and (suspected) exposure to other bacterial communicable diseases: Secondary | ICD-10-CM

## 2016-12-03 MED ORDER — AMOXICILLIN 500 MG PO CAPS: 500.0000 mg | ORAL_CAPSULE | Freq: Three times a day (TID) | ORAL | 0 refills | Status: DC

## 2016-12-03 NOTE — Patient Instructions (Addendum)
Gargle with warm salt water, and use cepacol lozenges for throat pain. Use tylenol or ibuprofen for pain or fever.    Strep Throat Strep throat is an infection of the throat. It is caused by germs. Strep throat spreads from person to person because of coughing, sneezing, or close contact. Follow these instructions at home: Medicines  Take over-the-counter and prescription medicines only as told by your doctor.  Take your antibiotic medicine as told by your doctor. Do not stop taking the medicine even if you feel better.  Have family members who also have a sore throat or fever go to a doctor. Eating and drinking  Do not share food, drinking cups, or personal items.  Try eating soft foods until your sore throat feels better.  Drink enough fluid to keep your pee (urine) clear or pale yellow. General instructions  Rinse your mouth (gargle) with a salt-water mixture 3-4 times per day or as needed. To make a salt-water mixture, stir -1 tsp of salt into 1 cup of warm water.  Make sure that all people in your house wash their hands well.  Rest.  Stay home from school or work until you have been taking antibiotics for 24 hours.  Keep all follow-up visits as told by your doctor. This is important. Contact a doctor if:  Your neck keeps getting bigger.  You get a rash, cough, or earache.  You cough up thick liquid that is green, yellow-brown, or bloody.  You have pain that does not get better with medicine.  Your problems get worse instead of getting better.  You have a fever. Get help right away if:  You throw up (vomit).  You get a very bad headache.  You neck hurts or it feels stiff.  You have chest pain or you are short of breath.  You have drooling, very bad throat pain, or changes in your voice.  Your neck is swollen or the skin gets red and tender.  Your mouth is dry or you are peeing less than normal.  You keep feeling more tired or it is hard to wake  up.  Your joints are red or they hurt. This information is not intended to replace advice given to you by your health care provider. Make sure you discuss any questions you have with your health care provider. Document Released: 04/23/2008 Document Revised: 07/04/2016 Document Reviewed: 02/28/2015 Elsevier Interactive Patient Education  2017 [employer]      IF you received an x-ray today, you will receive an invoice from [facility]. Please contact [facility] at [phone] with questions or concerns regarding your invoice.   IF you received labwork today, you will receive an invoice from [facility]. Please contact [facility] at [phone] with questions or concerns regarding your invoice.   Our billing staff will not be able to assist you with questions regarding bills from these companies.  You will be contacted with the lab results as soon as they are available. The fastest way to get your results is to activate your My Chart account. Instructions are located on the last page of this paperwork. If you have not heard from us regarding the results in 2 weeks, please contact this office.

## 2016-12-03 NOTE — Progress Notes (Signed)
[facility] [address], [place] [id number] [phone]  Date:  12/03/2016   Name:  Katrina Blackwell   DOB:  03-02-1986   MRN:  [id number]  PCP:  Default, Provider, MD   Chief Complaint  Patient presents with  . Sore Throat  . Ear Pain    History of Present Illness:  Katrina Blackwell is a 31 y.o. female patient who [facility] of sore throat and ear pain.  2 days ago, developed sore throat with pain with drinking or eating.  When she drinks, she has ear pain.  Yesterday, she had fever of 103.  She took tylenol.  She took tylenol this morning as last administration.  No coughing, but mild congestion.  Son has strep throat at this time, and was dxd today at his pediatric facility.  She states that a swab was performed.    Patient Active Problem List   Diagnosis Date Noted  . Language barrier 05/12/2013  . Vaginal delivery 05/12/2013  . Laceration of periurethral tissue with delivery 05/12/2013  . Generalized anxiety disorder 05/11/2013  . Migraines 05/11/2013    Past Medical History:  Diagnosis Date  . Anxiety   . Headache(784.0)    MIGRAINES;OTC  . Infection 12/2012   URI-SEEN AT MAU    Past Surgical History:  Procedure Laterality Date  . TONSILLECTOMY  2009    Social History  Substance Use Topics  . Smoking status: Former Smoker    Packs/day: 0.05    Types: Cigarettes  . Smokeless tobacco: Never Used  . Alcohol use No    Family History  Problem Relation Age of Onset  . Hyperlipidemia Mother   . Diabetes Father   . Heart disease Father   . Other Neg Hx     No Known Allergies  Medication list has been reviewed and updated.  Current Outpatient Prescriptions on File Prior to Visit  Medication Sig Dispense Refill  . aspirin-acetaminophen-caffeine (EXCEDRIN MIGRAINE) 250-250-65 MG per tablet Take 2 tablets by mouth every 6 (six) hours as needed for headache.    . oxyCODONE-acetaminophen (PERCOCET/ROXICET) 5-325 MG per tablet  Take 1-2 tablets by mouth every 4 (four) hours as needed. (Patient not taking: Reported on 12/03/2016) 30 tablet 0   No current facility-administered medications on file prior to visit.     ROS ROS otherwise unremarkable unless listed above.   Physical Examination: BP 102/66 (BP Location: Right Arm, Patient Position: Sitting, Cuff Size: Small)   Pulse (!) 101   Temp 98.3 F (36.8 C) (Oral)   Resp 18   Ht 5\' 4"  (1.626 m)   Wt 126 lb (57.2 kg)   SpO2 100%   BMI 21.63 kg/m  Ideal Body Weight: Weight in (lb) to have BMI = 25: 145.3  Physical Exam  Constitutional: She is oriented to person, place, and time. She appears well-developed and well-nourished. No distress.  HENT:  Head: Normocephalic and atraumatic.  Right Ear: Tympanic membrane, external ear and ear canal normal.  Left Ear: Tympanic membrane, external ear and ear canal normal.  Nose: Mucosal edema and rhinorrhea present. Right sinus exhibits no maxillary sinus tenderness and no frontal sinus tenderness. Left sinus exhibits no maxillary sinus tenderness and no frontal sinus tenderness.  Mouth/Throat: No uvula swelling. No oropharyngeal exudate, posterior oropharyngeal edema or posterior oropharyngeal erythema.  Eyes: Conjunctivae and EOM are normal. Pupils are equal, round, and reactive to light.  Cardiovascular: Normal rate and regular rhythm.  Exam  reveals no gallop, no distant heart sounds and no friction rub.   No murmur heard. Pulmonary/Chest: Effort normal. No respiratory distress. She has no decreased breath sounds. She has no wheezes. She has no rhonchi.  Lymphadenopathy:       Head (right side): No submandibular, no tonsillar, no preauricular and no posterior auricular adenopathy present.       Head (left side): No submandibular, no tonsillar, no preauricular and no posterior auricular adenopathy present.  Neurological: She is alert and oriented to person, place, and time.  Skin: She is not diaphoretic.   Psychiatric: She has a normal mood and affect. Her behavior is normal.    Assessment and Plan: Katrina Blackwell is a 31 y.o. female who is here today for cc of sore throat and ear pain. Will treat presumptively for strep throat as she displays symptoms of direct contact of very young child with strep.   Tylenol or ibuprofen for pain. Warm salt water gargles.  Exposure to strep throat - Plan: amoxicillin (AMOXIL) 500 MG capsule  Throat pain - Plan: amoxicillin (AMOXIL) 500 MG capsule  Trena Platt, PA-C [facility] 1/17/201812:35 PM

## 2017-02-06 ENCOUNTER — Inpatient Hospital Stay (HOSPITAL_COMMUNITY)
Admission: AD | Admit: 2017-02-06 | Discharge: 2017-02-06 | Disposition: A | Discharge status: Home / Self Care | Payer: Medicaid Other | Source: Ambulatory Visit | Attending: Family Medicine | Admitting: Family Medicine

## 2017-02-06 ENCOUNTER — Encounter (HOSPITAL_COMMUNITY): Payer: Self-pay

## 2017-02-06 DIAGNOSIS — O219 Vomiting of pregnancy, unspecified: Secondary | ICD-10-CM

## 2017-02-06 DIAGNOSIS — Z7982 Long term (current) use of aspirin: Secondary | ICD-10-CM | POA: Diagnosis not present

## 2017-02-06 DIAGNOSIS — O26891 Other specified pregnancy related conditions, first trimester: Secondary | ICD-10-CM

## 2017-02-06 DIAGNOSIS — R51 Headache: Secondary | ICD-10-CM | POA: Diagnosis not present

## 2017-02-06 DIAGNOSIS — O2341 Unspecified infection of urinary tract in pregnancy, first trimester: Secondary | ICD-10-CM

## 2017-02-06 DIAGNOSIS — R05 Cough: Secondary | ICD-10-CM | POA: Diagnosis present

## 2017-02-06 DIAGNOSIS — R519 Headache, unspecified: Secondary | ICD-10-CM

## 2017-02-06 DIAGNOSIS — O210 Mild hyperemesis gravidarum: Secondary | ICD-10-CM | POA: Insufficient documentation

## 2017-02-06 DIAGNOSIS — Z3A01 Less than 8 weeks gestation of pregnancy: Secondary | ICD-10-CM | POA: Diagnosis not present

## 2017-02-06 DIAGNOSIS — Z87891 Personal history of nicotine dependence: Secondary | ICD-10-CM | POA: Insufficient documentation

## 2017-02-06 DIAGNOSIS — R059 Cough, unspecified: Secondary | ICD-10-CM

## 2017-02-06 LAB — URINALYSIS, ROUTINE W REFLEX MICROSCOPIC
BILIRUBIN URINE: NEGATIVE
Glucose, UA: NEGATIVE mg/dL
Ketones, ur: NEGATIVE mg/dL
Nitrite: POSITIVE — AB
Protein, ur: 30 mg/dL — AB
Specific Gravity, Urine: 1.023 (ref 1.005–1.030)
pH: 7 (ref 5.0–8.0)

## 2017-02-06 LAB — CBC
HEMATOCRIT: 36.1 % (ref 36.0–46.0)
Hemoglobin: 12.4 g/dL (ref 12.0–15.0)
MCH: 28.4 pg (ref 26.0–34.0)
MCHC: 34.3 g/dL (ref 30.0–36.0)
MCV: 82.8 fL (ref 78.0–100.0)
PLATELETS: 288 10*3/uL (ref 150–400)
RBC: 4.36 MIL/uL (ref 3.87–5.11)
RDW: 12.7 % (ref 11.5–15.5)
WBC: 10 10*3/uL (ref 4.0–10.5)

## 2017-02-06 LAB — POCT FERN TEST: POCT Fern Test: POSITIVE

## 2017-02-06 LAB — COMPREHENSIVE METABOLIC PANEL
ALBUMIN: 3.6 g/dL (ref 3.5–5.0)
ALT: 20 U/L (ref 14–54)
AST: 24 U/L (ref 15–41)
Alkaline Phosphatase: 53 U/L (ref 38–126)
Anion gap: 8 (ref 5–15)
BILIRUBIN TOTAL: 0.3 mg/dL (ref 0.3–1.2)
BUN: 12 mg/dL (ref 6–20)
CHLORIDE: 100 mmol/L — AB (ref 101–111)
CO2: 26 mmol/L (ref 22–32)
Calcium: 9.4 mg/dL (ref 8.9–10.3)
Creatinine, Ser: 0.51 mg/dL (ref 0.44–1.00)
GFR calc Af Amer: >60 mL/min (ref >60)
GFR calc non Af Amer: >60 mL/min (ref >60)
GLUCOSE: 95 mg/dL (ref 65–99)
Potassium: 3.9 mmol/L (ref 3.5–5.1)
Sodium: 134 mmol/L — ABNORMAL LOW (ref 135–145)
Total Protein: 8 g/dL (ref 6.5–8.1)

## 2017-02-06 LAB — POCT PREGNANCY, URINE: Preg Test, Ur: POSITIVE — AB

## 2017-02-06 MED ORDER — GUAIFENESIN 100 MG/5ML PO SOLN
10.0000 mL | Freq: Once | ORAL | Status: AC
  Administered 2017-02-06: 200 mg via ORAL
  Filled 2017-02-06: qty 15

## 2017-02-06 MED ORDER — PROMETHAZINE HCL 25 MG PO TABS: 25.0000 mg | ORAL_TABLET | Freq: Four times a day (QID) | ORAL | 0 refills | Status: DC | PRN

## 2017-02-06 MED ORDER — PROMETHAZINE HCL 25 MG/ML IJ SOLN
25.0000 mg | Freq: Once | INTRAVENOUS | Status: AC
  Administered 2017-02-06: 25 mg via INTRAVENOUS
  Filled 2017-02-06: qty 1

## 2017-02-06 MED ORDER — BUTALBITAL-APAP-CAFFEINE 50-325-40 MG PO TABS
1.0000 | ORAL_TABLET | Freq: Once | ORAL | Status: AC
  Administered 2017-02-06: 1 via ORAL
  Filled 2017-02-06: qty 1

## 2017-02-06 MED ORDER — FAMOTIDINE IN NACL 20-0.9 MG/50ML-% IV SOLN
20.0000 mg | Freq: Once | INTRAVENOUS | Status: AC
  Administered 2017-02-06: 20 mg via INTRAVENOUS
  Filled 2017-02-06: qty 50

## 2017-02-06 MED ORDER — CEPHALEXIN 500 MG PO CAPS: 500.0000 mg | ORAL_CAPSULE | Freq: Four times a day (QID) | ORAL | 0 refills | Status: DC

## 2017-02-06 NOTE — Discharge Instructions (Signed)
Urinary Tract Infection, Adult A urinary tract infection (UTI) is an infection of any part of the urinary tract, which includes the kidneys, ureters, bladder, and urethra. These organs make, store, and get rid of urine in the body. UTI can be a bladder infection (cystitis) or kidney infection (pyelonephritis). What are the causes? This infection may be caused by fungi, viruses, or bacteria. Bacteria are the most common cause of UTIs. This condition can also be caused by repeated incomplete emptying of the bladder during urination. What increases the risk? This condition is more likely to develop if:  You ignore your need to urinate or hold urine for Kirstein periods of time.  You do not empty your bladder completely during urination.  You wipe back to front after urinating or having a bowel movement, if you are female.  You are uncircumcised, if you are female.  You are constipated.  You have a urinary catheter that stays in place (indwelling).  You have a weak defense (immune) system.  You have a medical condition that affects your bowels, kidneys, or bladder.  You have diabetes.  You take antibiotic medicines frequently or for Mikami periods of time, and the antibiotics no longer work well against certain types of infections (antibiotic resistance).  You take medicines that irritate your urinary tract.  You are exposed to chemicals that irritate your urinary tract.  You are female. What are the signs or symptoms? Symptoms of this condition include:  Fever.  Frequent urination or passing small amounts of urine frequently.  Needing to urinate urgently.  Pain or burning with urination.  Urine that smells bad or unusual.  Cloudy urine.  Pain in the lower abdomen or back.  Trouble urinating.  Blood in the urine.  Vomiting or being less hungry than normal.  Diarrhea or abdominal pain.  Vaginal discharge, if you are female. How is this diagnosed? This condition is  diagnosed with a medical history and physical exam. You will also need to provide a urine sample to test your urine. Other tests may be done, including:  Blood tests.  Sexually transmitted disease (STD) testing. If you have had more than one UTI, a cystoscopy or imaging studies may be done to determine the cause of the infections. How is this treated? Treatment for this condition often includes a combination of two or more of the following:  Antibiotic medicine.  Other medicines to treat less common causes of UTI.  Over-the-counter medicines to treat pain.  Drinking enough water to stay hydrated. Follow these instructions at home:  Take over-the-counter and prescription medicines only as told by your health care provider.  If you were prescribed an antibiotic, take it as told by your health care provider. Do not stop taking the antibiotic even if you start to feel better.  Avoid alcohol, caffeine, tea, and carbonated beverages. They can irritate your bladder.  Drink enough fluid to keep your urine clear or pale yellow.  Keep all follow-up visits as told by your health care provider. This is important.  Make sure to:  Empty your bladder often and completely. Do not hold urine for Ritter periods of time.  Empty your bladder before and after sex.  Wipe from front to back after a bowel movement if you are female. Use each tissue one time when you wipe. Contact a health care provider if:  You have back pain.  You have a fever.  You feel nauseous or vomit.  Your symptoms do not get better after 3  days.  Your symptoms go away and then return. Get help right away if:  You have severe back pain or lower abdominal pain.  You are vomiting and cannot keep down any medicines or water. This information is not intended to replace advice given to you by your health care provider. Make sure you discuss any questions you have with your health care provider. Document Released:  08/15/2005 Document Revised: 04/18/2016 Document Reviewed: 09/26/2015 Elsevier Interactive Patient Education  2017 Elsevier Inc.     Morning Sickness Morning sickness is when you feel sick to your stomach (nauseous) during pregnancy. This nauseous feeling may or may not come with vomiting. It often occurs in the morning but can be a problem any time of day. Morning sickness is most common during the first trimester, but it may continue throughout pregnancy. While morning sickness is unpleasant, it is usually harmless unless you develop severe and continual vomiting (hyperemesis gravidarum). This condition requires more intense treatment. What are the causes? The cause of morning sickness is not completely known but seems to be related to normal hormonal changes that occur in pregnancy. What increases the risk? You are at greater risk if you:  Experienced nausea or vomiting before your pregnancy.  Had morning sickness during a previous pregnancy.  Are pregnant with more than one baby, such as twins. How is this treated? Do not use any medicines (prescription, over-the-counter, or herbal) for morning sickness without first talking to your health care provider. Your health care provider may prescribe or recommend:  Vitamin B6 supplements.  Anti-nausea medicines.  The herbal medicine ginger. Follow these instructions at home:  Only take over-the-counter or prescription medicines as directed by your health care provider.  Taking multivitamins before getting pregnant can prevent or decrease the severity of morning sickness in most women.  Eat a piece of dry toast or unsalted crackers before getting out of bed in the morning.  Eat five or six small meals a day.  Eat dry and bland foods (rice, baked potato). Foods high in carbohydrates are often helpful.  Do not drink liquids with your meals. Drink liquids between meals.  Avoid greasy, fatty, and spicy foods.  Get someone to cook  for you if the smell of any food causes nausea and vomiting.  If you feel nauseous after taking prenatal vitamins, take the vitamins at night or with a snack.  Snack on protein foods (nuts, yogurt, cheese) between meals if you are hungry.  Eat unsweetened gelatins for desserts.  Wearing an acupressure wristband (worn for sea sickness) may be helpful.  Acupuncture may be helpful.  Do not smoke.  Get a humidifier to keep the air in your house free of odors.  Get plenty of fresh air. Contact a health care provider if:  Your home remedies are not working, and you need medicine.  You feel dizzy or lightheaded.  You are losing weight. Get help right away if:  You have persistent and uncontrolled nausea and vomiting.  You pass out (faint). This information is not intended to replace advice given to you by your health care provider. Make sure you discuss any questions you have with your health care provider. Document Released: 12/27/2006 Document Revised: 04/12/2016 Document Reviewed: 04/22/2013 Elsevier Interactive Patient Education  2017 [employer]

## 2017-02-06 NOTE — MAU Note (Signed)
This patient had no leaking of fluid and a fern test was not performed on this patient. Fern test and result entered under wrong patient by different provider.

## 2017-02-06 NOTE — MAU Provider Note (Signed)
History     CSN: [id number]  Arrival date and time: 02/06/17 7846   First Provider Initiated Contact with Patient 02/06/17 [phone]     Chief Complaint  Patient presents with  . Cough  . Morning Sickness   HPI  Katrina Blackwell is a 31 y.o. G2P1001 at [redacted]w[redacted]d by LMP who presents with n/v & cough. Patient was treated for URI & flu exposure last month. Completed medication but states non productive cough has continued. States her cough is causing headache & bilateral rib pain. Also has had n/v for the last 2 weeks that has worsened since yesterday. Has vomited 4 times in the last 24 hours. Denies abdominal pain, diarrhea, constipation, vaginal bleeding, sore throat, ear pain, chest pain, wheezing, or fever.  Endorses dysuria x 2 days. Denies hematuria, flank pain, or urinary frequency. Plans on going to [facility] for prenatal care.   OB History    Gravida Para Term Preterm AB Living   2 1 1     1    SAB TAB Ectopic Multiple Live Births           1      Past Medical History:  Diagnosis Date  . Anxiety   . Headache(784.0)    MIGRAINES;OTC    Past Surgical History:  Procedure Laterality Date  . TONSILLECTOMY  2009    Family History  Problem Relation Age of Onset  . Hyperlipidemia Mother   . Diabetes Father   . Heart disease Father   . Other Neg Hx     Social History  Substance Use Topics  . Smoking status: Former Smoker    Packs/day: 0.05    Types: Cigarettes  . Smokeless tobacco: Never Used  . Alcohol use No    Allergies: No Known Allergies  Prescriptions Prior to Admission  Medication Sig Dispense Refill Last Dose  . amoxicillin (AMOXIL) 500 MG capsule Take 1 capsule (500 mg total) by mouth 3 (three) times daily. 30 capsule 0   . aspirin-acetaminophen-caffeine (EXCEDRIN MIGRAINE) 250-250-65 MG per tablet Take 2 tablets by mouth every 6 (six) hours as needed for headache.   Not Taking  . oxyCODONE-acetaminophen (PERCOCET/ROXICET) 5-325 MG per tablet Take 1-2 tablets by  mouth every 4 (four) hours as needed. (Patient not taking: Reported on 12/03/2016) 30 tablet 0 Not Taking    Review of Systems  Constitutional: Negative for appetite change, chills, fever and unexpected weight change.  HENT: Negative for congestion, ear pain, sinus pressure and sore throat.   Eyes: Negative for photophobia.  Respiratory: Positive for cough. Negative for shortness of breath and wheezing.   Cardiovascular: Negative for chest pain.  Gastrointestinal: Positive for nausea and vomiting. Negative for abdominal pain, constipation and diarrhea.  Genitourinary: Positive for dysuria and urgency. Negative for flank pain, frequency, vaginal bleeding and vaginal discharge.  Neurological: Positive for headaches.   Physical Exam   Blood pressure 107/69, pulse (!) 106, temperature 98.9 F (37.2 C), temperature source Oral, resp. rate 18, height 5\' 6"  (1.676 m), weight 132 lb (59.9 kg), last menstrual period 12/22/2016, unknown if currently breastfeeding.  Physical Exam  Nursing note and vitals reviewed. Constitutional: She is oriented to person, place, and time. She appears well-developed and well-nourished. No distress.  HENT:  Head: Normocephalic and atraumatic.  Eyes: Conjunctivae are normal. Right eye exhibits no discharge. Left eye exhibits no discharge. No scleral icterus.  Neck: Normal range of motion.  Cardiovascular: Normal rate, regular rhythm and normal heart sounds.   No murmur  heard. Respiratory: Effort normal and breath sounds normal. No respiratory distress. She has no wheezes.  GI: Soft. Bowel sounds are normal. She exhibits no distension. There is no tenderness. There is no CVA tenderness.  Neurological: She is alert and oriented to person, place, and time.  Skin: Skin is warm and dry. She is not diaphoretic.  Psychiatric: She has a normal mood and affect. Her behavior is normal. Judgment and thought content normal.    MAU Course  Procedures Results for orders  placed or performed during the hospital encounter of 02/06/17 (from the past 24 hour(s))  Urinalysis, Routine w reflex microscopic (not at [facility])     Status: Abnormal   Collection Time: 02/06/17  1:20 AM  Result Value Ref Range   Color, Urine YELLOW YELLOW   APPearance CLOUDY (A) CLEAR   Specific Gravity, Urine 1.023 1.005 - 1.030   pH 7.0 5.0 - 8.0   Glucose, UA NEGATIVE NEGATIVE mg/dL   Hgb urine dipstick SMALL (A) NEGATIVE   Bilirubin Urine NEGATIVE NEGATIVE   Ketones, ur NEGATIVE NEGATIVE mg/dL   Protein, ur 30 (A) NEGATIVE mg/dL   Nitrite POSITIVE (A) NEGATIVE   Leukocytes, UA MODERATE (A) NEGATIVE  Pregnancy, urine POC     Status: Abnormal   Collection Time: 02/06/17  1:47 AM  Result Value Ref Range   Preg Test, Ur POSITIVE (A) NEGATIVE  CBC     Status: None   Collection Time: 02/06/17  2:20 AM  Result Value Ref Range   WBC 10.0 4.0 - 10.5 K/uL   RBC 4.36 3.87 - 5.11 MIL/uL   Hemoglobin 12.4 12.0 - 15.0 g/dL   HCT 40.9 81.1 - 91.4 %   MCV 82.8 78.0 - 100.0 fL   MCH 28.4 26.0 - 34.0 pg   MCHC 34.3 30.0 - 36.0 g/dL   RDW 78.2 95.6 - 21.3 %   Platelets 288 150 - 400 K/uL  Comprehensive metabolic panel     Status: Abnormal   Collection Time: 02/06/17  2:20 AM  Result Value Ref Range   Sodium 134 (L) 135 - 145 mmol/L   Potassium 3.9 3.5 - 5.1 mmol/L   Chloride 100 (L) 101 - 111 mmol/L   CO2 26 22 - 32 mmol/L   Glucose, Bld 95 65 - 99 mg/dL   BUN 12 6 - 20 mg/dL   Creatinine, Ser 0.86 0.44 - 1.00 mg/dL   Calcium 9.4 8.9 - 57.8 mg/dL   Total Protein 8.0 6.5 - 8.1 g/dL   Albumin 3.6 3.5 - 5.0 g/dL   AST 24 15 - 41 U/L   ALT 20 14 - 54 U/L   Alkaline Phosphatase 53 38 - 126 U/L   Total Bilirubin 0.3 0.3 - 1.2 mg/dL   GFR calc non Af Amer >60 >60 mL/min   GFR calc Af Amer >60 >60 mL/min   Anion gap 8 5 - 15  Fern Test     Status: Normal   Collection Time: 02/06/17  3:42 AM  Result Value Ref Range   POCT Fern Test Positive = ruptured amniotic membanes     MDM UPT  positive u/a +nitrites IV fluids, phenergan, & pepcid Guaifenesin Fioricet Assessment and Plan  A; 1. UTI (urinary tract infection) during pregnancy, first trimester   2. Nausea and vomiting during pregnancy prior to [redacted] weeks gestation   3. Cough   4. Pregnancy headache in first trimester    P: Discharge home Rx keflex & phenergan Discussed reasons to  return to MAU Keep follow up appointment with OB/PCP  Urine culture pending   Judeth Hornrin Waddell Iten 02/06/2017, 1:52 AM

## 2017-02-06 NOTE — MAU Note (Signed)
Pt presents complaining of a cough for 3 weeks that is getting worse and vomiting. Thrown up 4-5 times in the last 24 hours. Denies bleeding or discharge. Denies pain.

## 2017-02-08 LAB — CULTURE, OB URINE

## 2017-02-18 ENCOUNTER — Inpatient Hospital Stay (HOSPITAL_COMMUNITY)
Admission: AD | Admit: 2017-02-18 | Discharge: 2017-02-18 | Disposition: A | Discharge status: Home / Self Care | Payer: Medicaid Other | Source: Ambulatory Visit | Attending: Obstetrics and Gynecology | Admitting: Obstetrics and Gynecology

## 2017-02-18 ENCOUNTER — Inpatient Hospital Stay (HOSPITAL_COMMUNITY): Payer: Medicaid Other

## 2017-02-18 ENCOUNTER — Encounter (HOSPITAL_COMMUNITY): Payer: Self-pay

## 2017-02-18 DIAGNOSIS — R109 Unspecified abdominal pain: Secondary | ICD-10-CM | POA: Diagnosis present

## 2017-02-18 DIAGNOSIS — Z833 Family history of diabetes mellitus: Secondary | ICD-10-CM | POA: Diagnosis not present

## 2017-02-18 DIAGNOSIS — Z3A08 8 weeks gestation of pregnancy: Secondary | ICD-10-CM | POA: Insufficient documentation

## 2017-02-18 DIAGNOSIS — F419 Anxiety disorder, unspecified: Secondary | ICD-10-CM | POA: Insufficient documentation

## 2017-02-18 DIAGNOSIS — O26891 Other specified pregnancy related conditions, first trimester: Secondary | ICD-10-CM | POA: Insufficient documentation

## 2017-02-18 DIAGNOSIS — O26899 Other specified pregnancy related conditions, unspecified trimester: Secondary | ICD-10-CM

## 2017-02-18 DIAGNOSIS — O210 Mild hyperemesis gravidarum: Secondary | ICD-10-CM | POA: Insufficient documentation

## 2017-02-18 DIAGNOSIS — R51 Headache: Secondary | ICD-10-CM | POA: Diagnosis not present

## 2017-02-18 DIAGNOSIS — O99341 Other mental disorders complicating pregnancy, first trimester: Secondary | ICD-10-CM | POA: Diagnosis not present

## 2017-02-18 DIAGNOSIS — Z8249 Family history of ischemic heart disease and other diseases of the circulatory system: Secondary | ICD-10-CM | POA: Insufficient documentation

## 2017-02-18 DIAGNOSIS — Z87891 Personal history of nicotine dependence: Secondary | ICD-10-CM | POA: Diagnosis not present

## 2017-02-18 DIAGNOSIS — O219 Vomiting of pregnancy, unspecified: Secondary | ICD-10-CM

## 2017-02-18 LAB — COMPREHENSIVE METABOLIC PANEL
ALT: 15 U/L (ref 14–54)
AST: 23 U/L (ref 15–41)
Albumin: 3.2 g/dL — ABNORMAL LOW (ref 3.5–5.0)
Alkaline Phosphatase: 36 U/L — ABNORMAL LOW (ref 38–126)
Anion gap: 7 (ref 5–15)
BILIRUBIN TOTAL: 0.6 mg/dL (ref 0.3–1.2)
BUN: 9 mg/dL (ref 6–20)
CO2: 26 mmol/L (ref 22–32)
Calcium: 8.6 mg/dL — ABNORMAL LOW (ref 8.9–10.3)
Chloride: 96 mmol/L — ABNORMAL LOW (ref 101–111)
Creatinine, Ser: 0.56 mg/dL (ref 0.44–1.00)
GFR calc Af Amer: >60 mL/min (ref >60)
GFR calc non Af Amer: >60 mL/min (ref >60)
GLUCOSE: 339 mg/dL — AB (ref 65–99)
POTASSIUM: 3.3 mmol/L — AB (ref 3.5–5.1)
Sodium: 129 mmol/L — ABNORMAL LOW (ref 135–145)
TOTAL PROTEIN: 6.7 g/dL (ref 6.5–8.1)

## 2017-02-18 LAB — URINALYSIS, ROUTINE W REFLEX MICROSCOPIC
BILIRUBIN URINE: NEGATIVE
GLUCOSE, UA: NEGATIVE mg/dL
Hgb urine dipstick: NEGATIVE
Ketones, ur: 80 mg/dL — AB
Leukocytes, UA: NEGATIVE
NITRITE: NEGATIVE
PH: 7 (ref 5.0–8.0)
Protein, ur: NEGATIVE mg/dL
SPECIFIC GRAVITY, URINE: 1.018 (ref 1.005–1.030)

## 2017-02-18 LAB — CBC
HCT: 32.2 % — ABNORMAL LOW (ref 36.0–46.0)
Hemoglobin: 11 g/dL — ABNORMAL LOW (ref 12.0–15.0)
MCH: 28.4 pg (ref 26.0–34.0)
MCHC: 34.2 g/dL (ref 30.0–36.0)
MCV: 83 fL (ref 78.0–100.0)
PLATELETS: 241 10*3/uL (ref 150–400)
RBC: 3.88 MIL/uL (ref 3.87–5.11)
RDW: 12.9 % (ref 11.5–15.5)
WBC: 7.4 10*3/uL (ref 4.0–10.5)

## 2017-02-18 LAB — GLUCOSE, CAPILLARY
GLUCOSE-CAPILLARY: 71 mg/dL (ref 65–99)
Glucose-Capillary: 159 mg/dL — ABNORMAL HIGH (ref 65–99)

## 2017-02-18 MED ORDER — ACETAMINOPHEN 325 MG PO TABS
650.0000 mg | ORAL_TABLET | Freq: Once | ORAL | Status: AC
  Administered 2017-02-18: 650 mg via ORAL
  Filled 2017-02-18: qty 2

## 2017-02-18 MED ORDER — METOCLOPRAMIDE HCL 5 MG/ML IJ SOLN
10.0000 mg | Freq: Once | INTRAMUSCULAR | Status: AC
  Administered 2017-02-18: 10 mg via INTRAVENOUS
  Filled 2017-02-18: qty 2

## 2017-02-18 MED ORDER — M.V.I. ADULT IV INJ
Freq: Once | INTRAVENOUS | Status: AC
  Administered 2017-02-18: 04:00:00 via INTRAVENOUS
  Filled 2017-02-18: qty 10

## 2017-02-18 MED ORDER — METOCLOPRAMIDE HCL 10 MG PO TABS: 10.0000 mg | ORAL_TABLET | Freq: Three times a day (TID) | ORAL | 0 refills | Status: DC

## 2017-02-18 NOTE — MAU Note (Signed)
Patient presents to MAU with c/o headache, nausea, abdominal pain. Patient states she has thrown up 8 times today- current medication prescribed is not working. Started this morning and gotten worse since. Denies bleeding, discharge, or LOF.

## 2017-02-18 NOTE — MAU Provider Note (Signed)
History   [id number]   Chief Complaint  Patient presents with  . Morning Sickness  . Headache  . Abdominal Pain    HPI Katrina Blackwell is a 31 y.o. female  G2P1001 with suspected IUP at [redacted]w[redacted]d by LMP here with report of increased nausea and vomiting in the past 24 hours.  Last seen in MAU on 02/06/17 with similar complaint in addition to cough.  Diagnosed with UTI, N&V, and cough and discharged home with Keflex and phenergan.  Urine culture with sensitivity to Keflex.  Report mid to upper abdominal pain with vomiting.  Denies vaginal bleeding.  Unable to hold any food or drink down.    Patient's last menstrual period was 12/22/2016 (exact date).  OB History  Gravida Para Term Preterm AB Living  SAB TAB Ectopic Multiple Live Births          1    # Outcome Date GA Lbr Len/2nd Weight Sex Delivery Anes PTL Lv  2 Current           1 Term 05/12/13 [redacted]w[redacted]d 06:39 / 07:41 6 lb 3 oz (2.807 kg) M Vag-Spont None  LIV     Birth Comments: No problems at birth      Past Medical History:  Diagnosis Date  . Anxiety   . Headache(784.0)    MIGRAINES;OTC    Family History  Problem Relation Age of Onset  . Hyperlipidemia Mother   . Diabetes Father   . Heart disease Father   . Other Neg Hx     Social History   Social History  . Marital status: Married    Spouse name: [name]  . Number of children: N/A  . Years of education: 74   Occupational History  . HOMEMAKER-STUDENT    Social History Main Topics  . Smoking status: Former Smoker    Packs/day: 0.05    Types: Cigarettes  . Smokeless tobacco: Never Used  . Alcohol use No  . Drug use: No  . Sexual activity: Yes    Partners: Male    Birth control/ protection: None   Other Topics Concern  . None   Social History Narrative  . None    No Known Allergies  No current facility-administered medications on file prior to encounter.    Current Outpatient Prescriptions on File Prior to Encounter  Medication Sig  Dispense Refill  . aspirin-acetaminophen-caffeine (EXCEDRIN MIGRAINE) 250-250-65 MG per tablet Take 2 tablets by mouth every 6 (six) hours as needed for headache.    . oxyCODONE-acetaminophen (PERCOCET/ROXICET) 5-325 MG per tablet Take 1-2 tablets by mouth every 4 (four) hours as needed. 30 tablet 0  . cephALEXin (KEFLEX) 500 MG capsule Take 1 capsule (500 mg total) by mouth 4 (four) times daily. 28 capsule 0  . promethazine (PHENERGAN) 25 MG tablet Take 1 tablet (25 mg total) by mouth every 6 (six) hours as needed for nausea or vomiting. 30 tablet 0     Review of Systems  Constitutional: Positive for appetite change. Negative for fever.  Respiratory: Negative for cough and shortness of breath.   Gastrointestinal: Positive for abdominal pain, nausea and vomiting. Negative for constipation, diarrhea and rectal pain.  Genitourinary: Negative for dysuria, flank pain, hematuria, pelvic pain, vaginal bleeding and vaginal discharge.  Neurological: Positive for light-headedness and headaches. Negative for dizziness.     Physical Exam   Vitals:   02/18/17 0335  BP: 106/76  Pulse: (!) 101  Resp: 16  Temp: 98.4 F (36.9 C)  TempSrc: Oral    Physical Exam  Constitutional: She is oriented to person, place, and time. She appears well-developed and well-nourished.  HENT:  Head: Normocephalic.  Mouth/Throat: Mucous membranes are dry.  Neck: Normal range of motion. Neck supple.  Cardiovascular: Normal rate, regular rhythm and normal heart sounds.   Respiratory: Effort normal and breath sounds normal.  GI: There is no tenderness.  Genitourinary: Uterus is enlarged. No bleeding in the vagina.  Neurological: She is alert and oriented to person, place, and time. She has normal reflexes.  Skin: Skin is warm and dry. She is not diaphoretic.    MAU Course  Procedures  Ultrasound: FINDINGS: Intrauterine gestational sac: Present  Yolk sac:  Present  Embryo:  Present  Cardiac Activity:  Present  Heart Rate: 168  bpm  CRL:  21  mm   8 w   4 d                  [place] EDC: September 26, 2017  Subchorionic hemorrhage:  None visualized.  Maternal uterus/adnexae: Normal.  No free fluid.  IMPRESSION: Single live intrauterine pregnancy, gestational age by ultrasound 8 weeks and 4 days without immediate complication. MDM Results for orders placed or performed during the hospital encounter of 02/18/17 (from the past 24 hour(s))  Urinalysis, Routine w reflex microscopic     Status: Abnormal   Collection Time: 02/18/17  3:40 AM  Result Value Ref Range   Color, Urine YELLOW YELLOW   APPearance CLOUDY (A) CLEAR   Specific Gravity, Urine 1.018 1.005 - 1.030   pH 7.0 5.0 - 8.0   Glucose, UA NEGATIVE NEGATIVE mg/dL   Hgb urine dipstick NEGATIVE NEGATIVE   Bilirubin Urine NEGATIVE NEGATIVE   Ketones, ur 80 (A) NEGATIVE mg/dL   Protein, ur NEGATIVE NEGATIVE mg/dL   Nitrite NEGATIVE NEGATIVE   Leukocytes, UA NEGATIVE NEGATIVE  CBC     Status: Abnormal   Collection Time: 02/18/17  4:58 AM  Result Value Ref Range   WBC 7.4 4.0 - 10.5 K/uL   RBC 3.88 3.87 - 5.11 MIL/uL   Hemoglobin 11.0 (L) 12.0 - 15.0 g/dL   HCT 40.9 (L) 81.1 - 91.4 %   MCV 83.0 78.0 - 100.0 fL   MCH 28.4 26.0 - 34.0 pg   MCHC 34.2 30.0 - 36.0 g/dL   RDW 78.2 95.6 - 21.3 %   Platelets 241 150 - 400 K/uL  Comprehensive metabolic panel     Status: Abnormal   Collection Time: 02/18/17  4:58 AM  Result Value Ref Range   Sodium 129 (L) 135 - 145 mmol/L   Potassium 3.3 (L) 3.5 - 5.1 mmol/L   Chloride 96 (L) 101 - 111 mmol/L   CO2 26 22 - 32 mmol/L   Glucose, Bld 339 (H) 65 - 99 mg/dL   BUN 9 6 - 20 mg/dL   Creatinine, Ser 0.86 0.44 - 1.00 mg/dL   Calcium 8.6 (L) 8.9 - 10.3 mg/dL   Total Protein 6.7 6.5 - 8.1 g/dL   Albumin 3.2 (L) 3.5 - 5.0 g/dL   AST 23 15 - 41 U/L   ALT 15 14 - 54 U/L   Alkaline Phosphatase 36 (L) 38 - 126 U/L   Total Bilirubin 0.6 0.3 - 1.2 mg/dL   GFR calc non Af Amer >60 >60 mL/min    GFR calc Af Amer >60 >60 mL/min   Anion gap 7 5 -  15   0630 Discussed with RN; blood drawn after receiving bolus with D5LR; recheck 1.5 hr later  CBG (last 3)   Recent Labs  02/18/17 0632  GLUCAP 159*    0645 IV NS hung and given bolus; CBG recheck at 0730 71  CBG (last 3)   Recent Labs  02/18/17 0632 02/18/17 0740  GLUCAP 159* 71   CBG (last 3)     Assessment and Plan  30 y.o. G2P1001 at [redacted]w[redacted]d IUP  Nausea and Vomiting in Pregnancy  Plan: Discharge home Continue antiemetics RX Reglan (add) New OB scheduled for 03/11/17   Marlis Edelson, CNM 02/18/2017 7:41 AM   Prior to discharge notified by RN that patient temperature was 100.1.  Pt assessed denies any pain; no new symptoms not expressed in HPI.  Abdominal pain not present due to not vomiting.  WBC 7.4.  UA no longer with nitrites or leukocytes.  No report of flank pain.  Tylenol 650 mg given PO.  Instructed patient to report if no improvement or worsening of symptoms.  Eino Farber Kennith Gain, CNM

## 2017-03-05 ENCOUNTER — Encounter (HOSPITAL_COMMUNITY): Payer: Self-pay | Admitting: *Deleted

## 2017-03-05 ENCOUNTER — Inpatient Hospital Stay (HOSPITAL_COMMUNITY)
Admission: AD | Admit: 2017-03-05 | Discharge: 2017-03-05 | Disposition: A | Discharge status: Home / Self Care | Payer: Medicaid Other | Source: Ambulatory Visit | Attending: Family Medicine | Admitting: Family Medicine

## 2017-03-05 DIAGNOSIS — R12 Heartburn: Secondary | ICD-10-CM | POA: Insufficient documentation

## 2017-03-05 DIAGNOSIS — Z3A10 10 weeks gestation of pregnancy: Secondary | ICD-10-CM | POA: Diagnosis not present

## 2017-03-05 DIAGNOSIS — Z87891 Personal history of nicotine dependence: Secondary | ICD-10-CM | POA: Insufficient documentation

## 2017-03-05 DIAGNOSIS — O210 Mild hyperemesis gravidarum: Secondary | ICD-10-CM | POA: Insufficient documentation

## 2017-03-05 DIAGNOSIS — O219 Vomiting of pregnancy, unspecified: Secondary | ICD-10-CM

## 2017-03-05 DIAGNOSIS — O26891 Other specified pregnancy related conditions, first trimester: Secondary | ICD-10-CM | POA: Insufficient documentation

## 2017-03-05 LAB — CBC
HCT: 36.9 % (ref 36.0–46.0)
Hemoglobin: 12.7 g/dL (ref 12.0–15.0)
MCH: 28.9 pg (ref 26.0–34.0)
MCHC: 34.4 g/dL (ref 30.0–36.0)
MCV: 83.9 fL (ref 78.0–100.0)
Platelets: 241 10*3/uL (ref 150–400)
RBC: 4.4 MIL/uL (ref 3.87–5.11)
RDW: 13 % (ref 11.5–15.5)
WBC: 7.3 10*3/uL (ref 4.0–10.5)

## 2017-03-05 LAB — COMPREHENSIVE METABOLIC PANEL
ALBUMIN: 3.6 g/dL (ref 3.5–5.0)
ALT: 14 U/L (ref 14–54)
ANION GAP: 9 (ref 5–15)
AST: 22 U/L (ref 15–41)
Alkaline Phosphatase: 41 U/L (ref 38–126)
BILIRUBIN TOTAL: 0.4 mg/dL (ref 0.3–1.2)
BUN: 6 mg/dL (ref 6–20)
CALCIUM: 9.3 mg/dL (ref 8.9–10.3)
CO2: 25 mmol/L (ref 22–32)
Chloride: 100 mmol/L — ABNORMAL LOW (ref 101–111)
Creatinine, Ser: 0.42 mg/dL — ABNORMAL LOW (ref 0.44–1.00)
GFR calc non Af Amer: >60 mL/min (ref >60)
GLUCOSE: 81 mg/dL (ref 65–99)
POTASSIUM: 3.5 mmol/L (ref 3.5–5.1)
SODIUM: 134 mmol/L — AB (ref 135–145)
TOTAL PROTEIN: 7.5 g/dL (ref 6.5–8.1)

## 2017-03-05 LAB — URINALYSIS, ROUTINE W REFLEX MICROSCOPIC
Bilirubin Urine: NEGATIVE
GLUCOSE, UA: NEGATIVE mg/dL
HGB URINE DIPSTICK: NEGATIVE
KETONES UR: 20 mg/dL — AB
Leukocytes, UA: NEGATIVE
Nitrite: NEGATIVE
PH: 8 (ref 5.0–8.0)
PROTEIN: NEGATIVE mg/dL
Specific Gravity, Urine: 1.015 (ref 1.005–1.030)

## 2017-03-05 MED ORDER — DEXTROSE 5 % IN LACTATED RINGERS IV BOLUS
1000.0000 mL | Freq: Once | INTRAVENOUS | Status: AC
  Administered 2017-03-05: 1000 mL via INTRAVENOUS

## 2017-03-05 MED ORDER — FAMOTIDINE IN NACL 20-0.9 MG/50ML-% IV SOLN
20.0000 mg | Freq: Once | INTRAVENOUS | Status: AC
  Administered 2017-03-05: 20 mg via INTRAVENOUS
  Filled 2017-03-05: qty 50

## 2017-03-05 MED ORDER — BUTALBITAL-APAP-CAFFEINE 50-325-40 MG PO TABS
2.0000 | ORAL_TABLET | Freq: Once | ORAL | Status: AC
  Administered 2017-03-05: 2 via ORAL
  Filled 2017-03-05: qty 2

## 2017-03-05 MED ORDER — PROMETHAZINE HCL 25 MG PO TABS: 25.0000 mg | ORAL_TABLET | Freq: Four times a day (QID) | ORAL | 0 refills | Status: DC | PRN

## 2017-03-05 MED ORDER — ONDANSETRON HCL 4 MG/2ML IJ SOLN
4.0000 mg | Freq: Once | INTRAMUSCULAR | Status: AC
  Administered 2017-03-05: 4 mg via INTRAVENOUS
  Filled 2017-03-05: qty 2

## 2017-03-05 MED ORDER — RANITIDINE HCL 150 MG PO TABS: 150.0000 mg | ORAL_TABLET | Freq: Two times a day (BID) | ORAL | 0 refills | Status: DC

## 2017-03-05 NOTE — MAU Provider Note (Signed)
History     CSN: [id number]  Arrival date and time: 03/05/17 1523   First Provider Initiated Contact with Patient 03/05/17 1606      Chief Complaint  Patient presents with  . Emesis During Pregnancy   HPI Katrina Blackwell is a 31 y.o. G2P1001 at [redacted]w[redacted]d who presents with nausea/vomiting & headache. Reports n/v throughout pregnancy that was being well controlled with reglan & phenergan until yesterday. States she has vomited 7 times yesterday and has not been able to keep down her medicine. Endorses some epigastric soreness & heartburn. Also complains of headache today. Rates headache 7/10. Has not treated. Denies abdominal pain, vaginal bleeding, fever, or diarrhea.   OB History    Gravida Para Term Preterm AB Living   SAB TAB Ectopic Multiple Live Births           1      Past Medical History:  Diagnosis Date  . Anxiety   . Headache(784.0)    MIGRAINES;OTC    Past Surgical History:  Procedure Laterality Date  . TONSILLECTOMY  2009    Family History  Problem Relation Age of Onset  . Hyperlipidemia Mother   . Diabetes Father   . Heart disease Father   . Other Neg Hx     Social History  Substance Use Topics  . Smoking status: Former Smoker    Packs/day: 0.05    Types: Cigarettes  . Smokeless tobacco: Never Used  . Alcohol use No    Allergies: No Known Allergies  Prescriptions Prior to Admission  Medication Sig Dispense Refill Last Dose  . cephALEXin (KEFLEX) 500 MG capsule Take 1 capsule (500 mg total) by mouth 4 (four) times daily. 28 capsule 0 02/17/2017 at 1500  . metoCLOPramide (REGLAN) 10 MG tablet Take 1 tablet (10 mg total) by mouth 3 (three) times daily with meals. 90 tablet 0   . promethazine (PHENERGAN) 25 MG tablet Take 1 tablet (25 mg total) by mouth every 6 (six) hours as needed for nausea or vomiting. 30 tablet 0 02/17/2017 at 1900    Review of Systems  Constitutional: Negative.   Gastrointestinal: Positive for nausea and vomiting.  Negative for abdominal pain, constipation and diarrhea.  Genitourinary: Negative.   Neurological: Positive for headaches. Negative for dizziness.   Physical Exam   Blood pressure (!) 93/52, pulse 94, temperature 98.4 F (36.9 C), temperature source Oral, resp. rate 15, weight 129 lb (58.5 kg), last menstrual period 12/22/2016, unknown if currently breastfeeding.  Physical Exam  Nursing note and vitals reviewed. Constitutional: She is oriented to person, place, and time. She appears well-developed and well-nourished. No distress.  HENT:  Head: Normocephalic and atraumatic.  Eyes: Conjunctivae are normal. Right eye exhibits no discharge. Left eye exhibits no discharge. No scleral icterus.  Neck: Normal range of motion.  Cardiovascular: Normal rate, regular rhythm and normal heart sounds.   No murmur heard. Respiratory: Effort normal and breath sounds normal. No respiratory distress. She has no wheezes.  GI: Soft. Bowel sounds are normal. She exhibits no distension. There is no tenderness. There is no rebound and no guarding.  Neurological: She is alert and oriented to person, place, and time.  Skin: Skin is warm and dry. She is not diaphoretic.  Psychiatric: She has a normal mood and affect. Her behavior is normal. Judgment and thought content normal.    MAU Course  Procedures Results for orders placed or performed during the hospital  encounter of 03/05/17 (from the past 24 hour(s))  Urinalysis, Routine w reflex microscopic     Status: Abnormal   Collection Time: 03/05/17  3:27 PM  Result Value Ref Range   Color, Urine YELLOW YELLOW   APPearance CLOUDY (A) CLEAR   Specific Gravity, Urine 1.015 1.005 - 1.030   pH 8.0 5.0 - 8.0   Glucose, UA NEGATIVE NEGATIVE mg/dL   Hgb urine dipstick NEGATIVE NEGATIVE   Bilirubin Urine NEGATIVE NEGATIVE   Ketones, ur 20 (A) NEGATIVE mg/dL   Protein, ur NEGATIVE NEGATIVE mg/dL   Nitrite NEGATIVE NEGATIVE   Leukocytes, UA NEGATIVE NEGATIVE  CBC      Status: None   Collection Time: 03/05/17  4:30 PM  Result Value Ref Range   WBC 7.3 4.0 - 10.5 K/uL   RBC 4.40 3.87 - 5.11 MIL/uL   Hemoglobin 12.7 12.0 - 15.0 g/dL   HCT 16.1 09.6 - 04.5 %   MCV 83.9 78.0 - 100.0 fL   MCH 28.9 26.0 - 34.0 pg   MCHC 34.4 30.0 - 36.0 g/dL   RDW 40.9 81.1 - 91.4 %   Platelets 241 150 - 400 K/uL  Comprehensive metabolic panel     Status: Abnormal   Collection Time: 03/05/17  4:30 PM  Result Value Ref Range   Sodium 134 (L) 135 - 145 mmol/L   Potassium 3.5 3.5 - 5.1 mmol/L   Chloride 100 (L) 101 - 111 mmol/L   CO2 25 22 - 32 mmol/L   Glucose, Bld 81 65 - 99 mg/dL   BUN 6 6 - 20 mg/dL   Creatinine, Ser 7.82 (L) 0.44 - 1.00 mg/dL   Calcium 9.3 8.9 - 95.6 mg/dL   Total Protein 7.5 6.5 - 8.1 g/dL   Albumin 3.6 3.5 - 5.0 g/dL   AST 22 15 - 41 U/L   ALT 14 14 - 54 U/L   Alkaline Phosphatase 41 38 - 126 U/L   Total Bilirubin 0.4 0.3 - 1.2 mg/dL   GFR calc non Af Amer >60 >60 mL/min   GFR calc Af Amer >60 >60 mL/min   Anion gap 9 5 - 15    MDM VSS, NAD IV LR bolus, zofran 4 mg IV, pepcid 20 mg IV Pt not observed vomiting & able to tolerate POs  Fioricet 2 tabs -- pt reports improvement in headache Assessment and Plan  A: 1. Nausea and vomiting during pregnancy prior to [redacted] weeks gestation   2. Heartburn during pregnancy in first trimester    P: Discharge home Rx phenergan & zantac Discussed reasons to return to MAU Keep scheduled OB appt  Judeth Horn 03/05/2017, 4:06 PM

## 2017-03-05 NOTE — Discharge Instructions (Signed)
General Headache Without Cause °A headache is pain or discomfort felt around the head or neck area. The specific cause of a headache may not be found. There are many causes and types of headaches. A few common ones are: °· Tension headaches. °· Migraine headaches. °· Cluster headaches. °· Chronic daily headaches. °Follow these instructions at home: °Watch your condition for any changes. Take these steps to help with your condition: °Managing pain °· Take over-the-counter and prescription medicines only as told by your health care provider. °· Lie down in a dark, quiet room when you have a headache. °· If directed, apply ice to the head and neck area: °¨ Put ice in a plastic bag. °¨ Place a towel between your skin and the bag. °¨ Leave the ice on for 20 minutes, 2-3 times per day. °· Use a heating pad or hot shower to apply heat to the head and neck area as told by your health care provider. °· Keep lights dim if bright lights bother you or make your headaches worse. °Eating and drinking °· Eat meals on a regular schedule. °· Limit alcohol use. °· Decrease the amount of caffeine you drink, or stop drinking caffeine. °General instructions °· Keep all follow-up visits as told by your health care provider. This is important. °· Keep a headache journal to help find out what may trigger your headaches. For example, write down: °¨ What you eat and drink. °¨ How much sleep you get. °¨ Any change to your diet or medicines. °· Try massage or other relaxation techniques. °· Limit stress. °· Sit up straight, and do not tense your muscles. °· Do not use tobacco products, including cigarettes, chewing tobacco, or e-cigarettes. If you need help quitting, ask your health care provider. °· Exercise regularly as told by your health care provider. °· Sleep on a regular schedule. Get 7-9 hours of sleep, or the amount recommended by your health care provider. °Contact a health care provider if: °· Your symptoms are not helped by  medicine. °· You have a headache that is different from the usual headache. °· You have nausea or you vomit. °· You have a fever. °Get help right away if: °· Your headache becomes severe. °· You have repeated vomiting. °· You have a stiff neck. °· You have a loss of vision. °· You have problems with speech. °· You have pain in the eye or ear. °· You have muscular weakness or loss of muscle control. °· You lose your balance or have trouble walking. °· You feel faint or pass out. °· You have confusion. °This information is not intended to replace advice given to you by your health care provider. Make sure you discuss any questions you have with your health care provider. °Document Released: 11/05/2005 Document Revised: 04/12/2016 Document Reviewed: 02/28/2015 °Elsevier Interactive Patient Education © 2017 [employer] °Morning Sickness °Morning sickness is when you feel sick to your stomach (nauseous) during pregnancy. This nauseous feeling may or may not come with vomiting. It often occurs in the morning but can be a problem any time of day. Morning sickness is most common during the first trimester, but it may continue throughout pregnancy. While morning sickness is unpleasant, it is usually harmless unless you develop severe and continual vomiting (hyperemesis gravidarum). This condition requires more intense treatment. °What are the causes? °The cause of morning sickness is not completely known but seems to be related to normal hormonal changes that occur in pregnancy. °What increases the risk? °You   are at greater risk if you: °· Experienced nausea or vomiting before your pregnancy. °· Had morning sickness during a previous pregnancy. °· Are pregnant with more than one baby, such as twins. °How is this treated? °Do not use any medicines (prescription, over-the-counter, or herbal) for morning sickness without first talking to your health care provider. Your health care provider may prescribe or  recommend: °· Vitamin B6 supplements. °· Anti-nausea medicines. °· The herbal medicine ginger. °Follow these instructions at home: °· Only take over-the-counter or prescription medicines as directed by your health care provider. °· Taking multivitamins before getting pregnant can prevent or decrease the severity of morning sickness in most women. °· Eat a piece of dry toast or unsalted crackers before getting out of bed in the morning. °· Eat five or six small meals a day. °· Eat dry and bland foods (rice, baked potato). Foods high in carbohydrates are often helpful. °· Do not drink liquids with your meals. Drink liquids between meals. °· Avoid greasy, fatty, and spicy foods. °· Get someone to cook for you if the smell of any food causes nausea and vomiting. °· If you feel nauseous after taking prenatal vitamins, take the vitamins at night or with a snack. °· Snack on protein foods (nuts, yogurt, cheese) between meals if you are hungry. °· Eat unsweetened gelatins for desserts. °· Wearing an acupressure wristband (worn for sea sickness) may be helpful. °· Acupuncture may be helpful. °· Do not smoke. °· Get a humidifier to keep the air in your house free of odors. °· Get plenty of fresh air. °Contact a health care provider if: °· Your home remedies are not working, and you need medicine. °· You feel dizzy or lightheaded. °· You are losing weight. °Get help right away if: °· You have persistent and uncontrolled nausea and vomiting. °· You pass out (faint). °This information is not intended to replace advice given to you by your health care provider. Make sure you discuss any questions you have with your health care provider. °Document Released: 12/27/2006 Document Revised: 04/12/2016 Document Reviewed: 04/22/2013 °Elsevier Interactive Patient Education © 2017 [employer] Inc[employer] ° °

## 2017-03-05 NOTE — MAU Note (Signed)
Pt started vomiting yesterday, unable to hold down anything including liquids.  Denies diarrhea, abd pain, bleeding.  Has prescription for reglan, last took it @ 0200, says it is not working.

## 2017-03-08 DIAGNOSIS — O0990 Supervision of high risk pregnancy, unspecified, unspecified trimester: Secondary | ICD-10-CM | POA: Insufficient documentation

## 2017-03-11 ENCOUNTER — Encounter: Payer: Self-pay | Admitting: Certified Nurse Midwife

## 2017-03-11 ENCOUNTER — Other Ambulatory Visit (HOSPITAL_COMMUNITY)
Admission: RE | Admit: 2017-03-11 | Discharge: 2017-03-11 | Disposition: A | Discharge status: Home / Self Care | Payer: Medicaid Other | Source: Ambulatory Visit | Attending: Certified Nurse Midwife | Admitting: Certified Nurse Midwife

## 2017-03-11 ENCOUNTER — Ambulatory Visit (INDEPENDENT_AMBULATORY_CARE_PROVIDER_SITE_OTHER): Payer: Medicaid Other | Admitting: Certified Nurse Midwife

## 2017-03-11 VITALS — BP 109/77 | HR 117 | Temp 97.6°F | Wt 132.7 lb

## 2017-03-11 DIAGNOSIS — B373 Candidiasis of vulva and vagina: Secondary | ICD-10-CM | POA: Insufficient documentation

## 2017-03-11 DIAGNOSIS — O98819 Other maternal infectious and parasitic diseases complicating pregnancy, unspecified trimester: Secondary | ICD-10-CM | POA: Insufficient documentation

## 2017-03-11 DIAGNOSIS — O219 Vomiting of pregnancy, unspecified: Secondary | ICD-10-CM

## 2017-03-11 DIAGNOSIS — Z3490 Encounter for supervision of normal pregnancy, unspecified, unspecified trimester: Secondary | ICD-10-CM

## 2017-03-11 DIAGNOSIS — Z789 Other specified health status: Secondary | ICD-10-CM

## 2017-03-11 DIAGNOSIS — Z3481 Encounter for supervision of other normal pregnancy, first trimester: Secondary | ICD-10-CM

## 2017-03-11 DIAGNOSIS — Z3A00 Weeks of gestation of pregnancy not specified: Secondary | ICD-10-CM | POA: Insufficient documentation

## 2017-03-11 LAB — OB RESULTS CONSOLE GC/CHLAMYDIA: Gonorrhea: NEGATIVE

## 2017-03-11 MED ORDER — DOXYLAMINE-PYRIDOXINE 10-10 MG PO TBEC: DELAYED_RELEASE_TABLET | ORAL | 4 refills | Status: DC

## 2017-03-11 MED ORDER — PROMETHAZINE HCL 25 MG PO TABS: 25.0000 mg | ORAL_TABLET | Freq: Four times a day (QID) | ORAL | 0 refills | Status: DC | PRN

## 2017-03-11 MED ORDER — PRENATE PIXIE 10-0.6-0.4-200 MG PO CAPS: 1.0000 | ORAL_CAPSULE | Freq: Every day | ORAL | 12 refills | Status: DC

## 2017-03-11 NOTE — Progress Notes (Signed)
Patient presents for New OB visit.  

## 2017-03-11 NOTE — Progress Notes (Signed)
Subjective:    Katrina Blackwell is being seen today for her first obstetrical visit.  This is a planned pregnancy. She is at [redacted]w[redacted]d gestation. Her obstetrical history is significant for none. Relationship with FOB: spouse, living together. Patient does intend to breast feed. Pregnancy history fully reviewed.  The information documented in the HPI was reviewed and verified.  Menstrual History: OB History    Gravida Para Term Preterm AB Living   SAB TAB Ectopic Multiple Live Births           1       Patient's last menstrual period was 12/22/2016 (exact date).    Past Medical History:  Diagnosis Date  . Anxiety   . Headache(784.0)    MIGRAINES;OTC    Past Surgical History:  Procedure Laterality Date  . TONSILLECTOMY  2009     (Not in a hospital admission) No Known Allergies  Social History  Substance Use Topics  . Smoking status: Former Smoker    Packs/day: 0.05    Types: Cigarettes  . Smokeless tobacco: Never Used  . Alcohol use No    Family History  Problem Relation Age of Onset  . Hyperlipidemia Mother   . Diabetes Father   . Heart disease Father   . Other Neg Hx      Review of Systems Constitutional: negative for weight loss Gastrointestinal: negative for vomiting Genitourinary:negative for genital lesions and vaginal discharge and dysuria Musculoskeletal:negative for back pain Behavioral/Psych: negative for abusive relationship, depression, illegal drug usage and tobacco use    Objective:    BP 109/77   Pulse (!) 117   Temp 97.6 F (36.4 C)   Wt 132 lb 11.2 oz (60.2 kg)   LMP 12/22/2016 (Exact Date)   BMI 21.42 kg/m  General Appearance:    Alert, cooperative, no distress, appears stated age  Head:    Normocephalic, without obvious abnormality, atraumatic  Eyes:    PERRL, conjunctiva/corneas clear, EOM's intact, fundi    benign, both eyes  Ears:    Normal TM's and external ear canals, both ears  Nose:   Nares normal, septum midline,  mucosa normal, no drainage    or sinus tenderness  Throat:   Lips, mucosa, and tongue normal; teeth and gums normal  Neck:   Supple, symmetrical, trachea midline, no adenopathy;    thyroid:  no enlargement/tenderness/nodules; no carotid   bruit or JVD  Back:     Symmetric, no curvature, ROM normal, no CVA tenderness  Lungs:     Clear to auscultation bilaterally, respirations unlabored  Chest Wall:    No tenderness or deformity   Heart:    Regular rate and rhythm, S1 and S2 normal, no murmur, rub   or gallop  Breast Exam:    No tenderness, masses, or nipple abnormality  Abdomen:     Soft, non-tender, bowel sounds active all four quadrants,    no masses, no organomegaly  Genitalia:    Normal female without lesion, discharge or tenderness  Extremities:   Extremities normal, atraumatic, no cyanosis or edema  Pulses:   2+ and symmetric all extremities  Skin:   Skin color, texture, turgor normal, no rashes or lesions  Lymph nodes:   Cervical, supraclavicular, and axillary nodes normal  Neurologic:   CNII-XII intact, normal strength, sensation and reflexes    throughout      Lab Review Urine pregnancy test Labs reviewed yes Radiologic studies reviewed yes  Assessment:    Pregnancy at [redacted]w[redacted]d weeks   Encounter for supervision of normal pregnancy, antepartum, unspecified gravidity - Plan: Obstetric Panel, Including HIV, Hemoglobinopathy evaluation, Cystic Fibrosis Mutation 97, Cytology - PAP, Varicella zoster antibody, IgG, HgB A1c, Culture, OB Urine, Cervicovaginal ancillary only, promethazine (PHENERGAN) 25 MG tablet  Language barrier  Nausea and vomiting during pregnancy prior to [redacted] weeks gestation - Plan: Doxylamine-Pyridoxine (DICLEGIS) 10-10 MG TBEC, Prenat-FeAsp-Meth-FA-DHA w/o A (PRENATE PIXIE) 10-0.6-0.4-200 MG CAPS   Plan:     Declines interpreter, able to speak english.   Prenatal vitamins.  Counseling provided regarding continued use of seat belts, cessation of alcohol  consumption, smoking or use of illicit drugs; infection precautions i.e., influenza/TDAP immunizations, toxoplasmosis,CMV, parvovirus, listeria and varicella; workplace safety, exercise during pregnancy; routine dental care, safe medications, sexual activity, hot tubs, saunas, pools, travel, caffeine use, fish and methlymercury, potential toxins, hair treatments, varicose veins Weight gain recommendations per IOM guidelines reviewed: underweight/BMI< 18.5--> gain 28 - 40 lbs; normal weight/BMI 18.5 - 24.9--> gain 25 - 35 lbs; overweight/BMI 25 - 29.9--> gain 15 - 25 lbs; obese/BMI >30->gain  11 - 20 lbs Problem list reviewed and updated. FIRST/CF mutation testing/NIPT/QUAD SCREEN/fragile X/Ashkenazi Jewish population testing/Spinal muscular atrophy discussed: requested. Role of ultrasound in pregnancy discussed; fetal survey: requested. Amniocentesis discussed: not indicated.  Meds ordered this encounter  Medications  . Doxylamine-Pyridoxine (DICLEGIS) 10-10 MG TBEC    Sig: Take 1 tablet with breakfast and lunch.  Take 2 tablets at bedtime.    Dispense:  100 tablet    Refill:  4  . Prenat-FeAsp-Meth-FA-DHA w/o A (PRENATE PIXIE) 10-0.6-0.4-200 MG CAPS    Sig: Take 1 tablet by mouth daily.    Dispense:  30 capsule    Refill:  12    Please process coupon: Rx BIN: [id number], RxPCN: [id number], RxGRP: [id number], RxID: [id number]  SUF: 01  . promethazine (PHENERGAN) 25 MG tablet    Sig: Take 1 tablet (25 mg total) by mouth every 6 (six) hours as needed for nausea or vomiting.    Dispense:  30 tablet    Refill:  0   Orders Placed This Encounter  Procedures  . Culture, OB Urine  . Obstetric Panel, Including HIV  . Hemoglobinopathy evaluation  . Cystic Fibrosis Mutation 97  . Varicella zoster antibody, IgG  . HgB A1c    Follow up in 4 weeks. 50% of 30 min visit spent on counseling and coordination of care.

## 2017-03-12 ENCOUNTER — Other Ambulatory Visit: Payer: Self-pay | Admitting: Certified Nurse Midwife

## 2017-03-12 DIAGNOSIS — Z3480 Encounter for supervision of other normal pregnancy, unspecified trimester: Secondary | ICD-10-CM

## 2017-03-12 DIAGNOSIS — B3731 Acute candidiasis of vulva and vagina: Secondary | ICD-10-CM

## 2017-03-12 DIAGNOSIS — B373 Candidiasis of vulva and vagina: Secondary | ICD-10-CM

## 2017-03-12 LAB — CYTOLOGY - PAP: Diagnosis: NEGATIVE

## 2017-03-12 LAB — CERVICOVAGINAL ANCILLARY ONLY
BACTERIAL VAGINITIS: NEGATIVE
CANDIDA VAGINITIS: POSITIVE — AB
Chlamydia: NEGATIVE
Neisseria Gonorrhea: NEGATIVE
TRICH (WINDOWPATH): NEGATIVE

## 2017-03-12 MED ORDER — TERCONAZOLE 0.8 % VA CREA: 1.0000 | TOPICAL_CREAM | Freq: Every day | VAGINAL | 0 refills | Status: DC

## 2017-03-15 LAB — OBSTETRIC PANEL, INCLUDING HIV
Antibody Screen: NEGATIVE
BASOS ABS: 0 10*3/uL (ref 0.0–0.2)
Basos: 0 %
EOS (ABSOLUTE): 0.4 10*3/uL (ref 0.0–0.4)
Eos: 5 %
HIV SCREEN 4TH GENERATION: NONREACTIVE
Hematocrit: 36.5 % (ref 34.0–46.6)
Hemoglobin: 11.8 g/dL (ref 11.1–15.9)
Hepatitis B Surface Ag: NEGATIVE
IMMATURE GRANULOCYTES: 0 %
Immature Grans (Abs): 0 10*3/uL (ref 0.0–0.1)
LYMPHS ABS: 1.7 10*3/uL (ref 0.7–3.1)
Lymphs: 25 %
MCH: 27.6 pg (ref 26.6–33.0)
MCHC: 32.3 g/dL (ref 31.5–35.7)
MCV: 86 fL (ref 79–97)
MONOS ABS: 0.6 10*3/uL (ref 0.1–0.9)
Monocytes: 9 %
NEUTROS ABS: 4.1 10*3/uL (ref 1.4–7.0)
NEUTROS PCT: 61 %
PLATELETS: 257 10*3/uL (ref 150–379)
RBC: 4.27 x10E6/uL (ref 3.77–5.28)
RDW: 13.7 % (ref 12.3–15.4)
RPR Ser Ql: NONREACTIVE
Rh Factor: POSITIVE
Rubella Antibodies, IGG: 4.52 index (ref >0.99)
WBC: 6.9 10*3/uL (ref 3.4–10.8)

## 2017-03-15 LAB — URINE CULTURE, OB REFLEX

## 2017-03-15 LAB — HEMOGLOBIN A1C
Est. average glucose Bld gHb Est-mCnc: 91 mg/dL
HEMOGLOBIN A1C: 4.8 % (ref 4.8–5.6)

## 2017-03-15 LAB — CYSTIC FIBROSIS MUTATION 97: Interpretation: NOT DETECTED

## 2017-03-15 LAB — HEMOGLOBINOPATHY EVALUATION
HGB A: 97.2 % (ref 96.4–98.8)
HGB C: 0 %
HGB S: 0 %
HGB VARIANT: 0 %
Hemoglobin A2 Quantitation: 2.8 % (ref 1.8–3.2)
Hemoglobin F Quantitation: 0 % (ref 0.0–2.0)

## 2017-03-15 LAB — VARICELLA ZOSTER ANTIBODY, IGG: VARICELLA: 215 {index} (ref >165)

## 2017-03-15 LAB — CULTURE, OB URINE

## 2017-03-18 ENCOUNTER — Other Ambulatory Visit: Payer: Self-pay | Admitting: Certified Nurse Midwife

## 2017-03-18 DIAGNOSIS — O99820 Streptococcus B carrier state complicating pregnancy: Secondary | ICD-10-CM

## 2017-03-18 DIAGNOSIS — Z3480 Encounter for supervision of other normal pregnancy, unspecified trimester: Secondary | ICD-10-CM

## 2017-03-18 MED ORDER — AMOXICILLIN-POT CLAVULANATE 875-125 MG PO TABS: 1.0000 | ORAL_TABLET | Freq: Two times a day (BID) | ORAL | 0 refills | Status: DC

## 2017-03-20 ENCOUNTER — Inpatient Hospital Stay (HOSPITAL_COMMUNITY)
Admission: AD | Admit: 2017-03-20 | Discharge: 2017-03-20 | Disposition: A | Discharge status: Home / Self Care | Payer: BLUE CROSS/BLUE SHIELD | Source: Ambulatory Visit | Attending: Obstetrics and Gynecology | Admitting: Obstetrics and Gynecology

## 2017-03-20 DIAGNOSIS — R000 Tachycardia, unspecified: Secondary | ICD-10-CM | POA: Insufficient documentation

## 2017-03-20 DIAGNOSIS — R0602 Shortness of breath: Secondary | ICD-10-CM | POA: Insufficient documentation

## 2017-03-20 DIAGNOSIS — O9989 Other specified diseases and conditions complicating pregnancy, childbirth and the puerperium: Secondary | ICD-10-CM | POA: Diagnosis not present

## 2017-03-20 DIAGNOSIS — O26891 Other specified pregnancy related conditions, first trimester: Secondary | ICD-10-CM | POA: Diagnosis present

## 2017-03-20 DIAGNOSIS — Z3A12 12 weeks gestation of pregnancy: Secondary | ICD-10-CM | POA: Diagnosis not present

## 2017-03-20 DIAGNOSIS — F419 Anxiety disorder, unspecified: Secondary | ICD-10-CM | POA: Diagnosis not present

## 2017-03-20 DIAGNOSIS — O99341 Other mental disorders complicating pregnancy, first trimester: Secondary | ICD-10-CM | POA: Diagnosis not present

## 2017-03-20 DIAGNOSIS — Z3491 Encounter for supervision of normal pregnancy, unspecified, first trimester: Secondary | ICD-10-CM

## 2017-03-20 DIAGNOSIS — R51 Headache: Secondary | ICD-10-CM | POA: Diagnosis not present

## 2017-03-20 DIAGNOSIS — Z87891 Personal history of nicotine dependence: Secondary | ICD-10-CM | POA: Diagnosis not present

## 2017-03-20 DIAGNOSIS — M940 Chondrocostal junction syndrome [Tietze]: Secondary | ICD-10-CM | POA: Insufficient documentation

## 2017-03-20 LAB — URINALYSIS, ROUTINE W REFLEX MICROSCOPIC
Bilirubin Urine: NEGATIVE
GLUCOSE, UA: NEGATIVE mg/dL
Hgb urine dipstick: NEGATIVE
Ketones, ur: NEGATIVE mg/dL
LEUKOCYTES UA: NEGATIVE
Nitrite: NEGATIVE
PH: 7 (ref 5.0–8.0)
Protein, ur: NEGATIVE mg/dL
SPECIFIC GRAVITY, URINE: 1.006 (ref 1.005–1.030)

## 2017-03-20 MED ORDER — IBUPROFEN 600 MG PO TABS
600.0000 mg | ORAL_TABLET | Freq: Once | ORAL | Status: AC
  Administered 2017-03-20: 600 mg via ORAL
  Filled 2017-03-20: qty 1

## 2017-03-20 MED ORDER — SIMETHICONE 80 MG PO CHEW
80.0000 mg | CHEWABLE_TABLET | Freq: Once | ORAL | Status: AC
  Administered 2017-03-20: 80 mg via ORAL
  Filled 2017-03-20: qty 1

## 2017-03-20 MED ORDER — CYCLOBENZAPRINE HCL 10 MG PO TABS
10.0000 mg | ORAL_TABLET | Freq: Once | ORAL | Status: AC
  Administered 2017-03-20: 10 mg via ORAL
  Filled 2017-03-20: qty 1

## 2017-03-20 MED ORDER — CYCLOBENZAPRINE HCL 10 MG PO TABS: 10.0000 mg | ORAL_TABLET | Freq: Two times a day (BID) | ORAL | 0 refills | Status: DC | PRN

## 2017-03-20 NOTE — MAU Note (Signed)
Pt. Present with SOB when taking a deep breath, pain 10/10, sharp pain in back on right upper side. Gestation age is 12w 4d - Doppler FHR 161 bpm. Denies vaginal bleeding, no discharge.  Pulse ox placed on right index finger - 100% RA. Color appropriate for ethnicity. Pt. Was informed today via phone by her doctors office of a UTI  - pt. States she has not started taking her medication.

## 2017-03-20 NOTE — MAU Provider Note (Signed)
History     CSN: [id number]  Arrival date and time: 03/20/17 8657  None     Chief Complaint  Patient presents with  . Shortness of Breath   HPI Katrina Blackwell is a 31 y.o. G2P1001 at [redacted]w[redacted]d who presents with chest pain & SOB. Patient reports symptoms began yesterday morning. Reports right rib pain that radiates to right shoulder. Pain is worse with breathing, coughing, & position changes. Rates pain 10/10. Not short of breath, states hurts to take deep breaths. Endorses cough x 3 months. Cough has improved & is non productive, but makes pain worse. Denies fever/chills, abdominal pain, dysuria, vaginal bleeding, syncope, palpitations.   OB History    Gravida Para Term Preterm AB Living   SAB TAB Ectopic Multiple Live Births           1      Past Medical History:  Diagnosis Date  . Anxiety   . Headache(784.0)    MIGRAINES;OTC    Past Surgical History:  Procedure Laterality Date  . TONSILLECTOMY  2009    Family History  Problem Relation Age of Onset  . Hyperlipidemia Mother   . Diabetes Father   . Heart disease Father   . Other Neg Hx     Social History  Substance Use Topics  . Smoking status: Former Smoker    Packs/day: 0.05    Types: Cigarettes  . Smokeless tobacco: Never Used  . Alcohol use No    Allergies: No Known Allergies  Prescriptions Prior to Admission  Medication Sig Dispense Refill Last Dose  . acetaminophen (TYLENOL) 500 MG tablet Take 500 mg by mouth every 6 (six) hours as needed for moderate pain or headache.   03/04/2017 at Unknown time  . amoxicillin-clavulanate (AUGMENTIN) 875-125 MG tablet Take 1 tablet by mouth 2 (two) times daily. 14 tablet 0   . Doxylamine-Pyridoxine (DICLEGIS) 10-10 MG TBEC Take 1 tablet with breakfast and lunch.  Take 2 tablets at bedtime. 100 tablet 4   . metoCLOPramide (REGLAN) 10 MG tablet Take 1 tablet (10 mg total) by mouth 3 (three) times daily with meals. 90 tablet 0 03/05/2017 at Unknown time  .  Prenat-FeAsp-Meth-FA-DHA w/o A (PRENATE PIXIE) 10-0.6-0.4-200 MG CAPS Take 1 tablet by mouth daily. 30 capsule 12   . Prenatal Vit-Fe Fumarate-FA (PRENATAL MULTIVITAMIN) TABS tablet Take 1 tablet by mouth daily at 12 noon.   03/04/2017 at Unknown time  . promethazine (PHENERGAN) 25 MG tablet Take 1 tablet (25 mg total) by mouth every 6 (six) hours as needed for nausea or vomiting. 30 tablet 0   . ranitidine (ZANTAC) 150 MG tablet Take 1 tablet (150 mg total) by mouth 2 (two) times daily. 60 tablet 0   . terconazole (TERAZOL 3) 0.8 % vaginal cream Place 1 applicator vaginally at bedtime. 20 g 0     Review of Systems  Constitutional: Negative.   Respiratory: Positive for cough. Negative for apnea, shortness of breath and wheezing.   Cardiovascular: Positive for chest pain. Negative for palpitations and leg swelling.  Genitourinary: Negative.   Musculoskeletal: Positive for back pain. Negative for neck pain.  Neurological: Negative for dizziness, syncope and headaches.   Physical Exam   Blood pressure 100/63, pulse 100, temperature 97.5 F (36.4 C), temperature source Oral, resp. rate 16, last menstrual period 12/22/2016, SpO2 100 %, unknown if currently breastfeeding.  Physical Exam  Nursing note and vitals reviewed. Constitutional: She is oriented  to person, place, and time. She appears well-developed and well-nourished. No distress.  HENT:  Head: Normocephalic and atraumatic.  Eyes: Conjunctivae are normal. Right eye exhibits no discharge. Left eye exhibits no discharge. No scleral icterus.  Neck: Normal range of motion.  Cardiovascular: Normal rate, regular rhythm and normal heart sounds.   No murmur heard. Respiratory: Effort normal and breath sounds normal. No respiratory distress. She has no wheezes. She exhibits tenderness. She exhibits no laceration, no crepitus, no edema, no deformity and no swelling.  Pain reproduced with palpation of right lower ribs and right scapula. Pain  worse with passive & active movement of right arm.   Neurological: She is alert and oriented to person, place, and time.  Skin: Skin is warm and dry. She is not diaphoretic.  Psychiatric: She has a normal mood and affect. Her behavior is normal. Judgment and thought content normal.    MAU Course  Procedures Results for orders placed or performed during the hospital encounter of 03/20/17 (from the past 24 hour(s))  Urinalysis, Routine w reflex microscopic     Status: Abnormal   Collection Time: 03/20/17  3:23 AM  Result Value Ref Range   Color, Urine STRAW (A) YELLOW   APPearance CLEAR CLEAR   Specific Gravity, Urine 1.006 1.005 - 1.030   pH 7.0 5.0 - 8.0   Glucose, UA NEGATIVE NEGATIVE mg/dL   Hgb urine dipstick NEGATIVE NEGATIVE   Bilirubin Urine NEGATIVE NEGATIVE   Ketones, ur NEGATIVE NEGATIVE mg/dL   Protein, ur NEGATIVE NEGATIVE mg/dL   Nitrite NEGATIVE NEGATIVE   Leukocytes, UA NEGATIVE NEGATIVE    MDM FHT 161 VSS, SpO2 100% Lung sounds clear EKG  Flexeril, simethicone Patient reports improvement in pain, 10>6  Assessment and Plan  A; 1. Costochondritis   2. Fetal heart tones present, first trimester    P: Discharge home Rx flexeril Discussed reasons to return to MAU vs ED Keep follow up appointment with OB/PCP   Judeth Horn 03/20/2017, 2:56 AM

## 2017-03-20 NOTE — Discharge Instructions (Signed)
Costochondritis Costochondritis is swelling and irritation (inflammation) of the tissue (cartilage) that connects your ribs to your breastbone (sternum). This causes pain in the front of your chest. Usually, the pain:  Starts gradually.  Is in more than one rib. This condition usually goes away on its own over time. Follow these instructions at home:  Do not do anything that makes your pain worse.  If directed, put ice on the painful area:  Put ice in a plastic bag.  Place a towel between your skin and the bag.  Leave the ice on for 20 minutes, 2-3 times a day.  If directed, put heat on the affected area as often as told by your doctor. Use the heat source that your doctor tells you to use, such as a moist heat pack or a heating pad.  Place a towel between your skin and the heat source.  Leave the heat on for 20-30 minutes.  Take off the heat if your skin turns bright red. This is very important if you cannot feel pain, heat, or cold. You may have a greater risk of getting burned.  Take over-the-counter and prescription medicines only as told by your doctor.  Return to your normal activities as told by your doctor. Ask your doctor what activities are safe for you.  Keep all follow-up visits as told by your doctor. This is important. Contact a doctor if:  You have chills or a fever.  Your pain does not go away or it gets worse.  You have a cough that does not go away. Get help right away if:  You are short of breath. This information is not intended to replace advice given to you by your health care provider. Make sure you discuss any questions you have with your health care provider. Document Released: 04/23/2008 Document Revised: 05/25/2016 Document Reviewed: 02/29/2016 Elsevier Interactive Patient Education  2017 Elsevier Inc.  

## 2017-03-20 NOTE — MAU Note (Signed)
EKG tech at the [facility] for EKG.

## 2017-03-25 ENCOUNTER — Encounter: Payer: Self-pay | Admitting: *Deleted

## 2017-03-25 DIAGNOSIS — Z3480 Encounter for supervision of other normal pregnancy, unspecified trimester: Secondary | ICD-10-CM

## 2017-03-26 ENCOUNTER — Other Ambulatory Visit: Payer: Self-pay | Admitting: Certified Nurse Midwife

## 2017-03-26 DIAGNOSIS — Z3480 Encounter for supervision of other normal pregnancy, unspecified trimester: Secondary | ICD-10-CM

## 2017-03-26 DIAGNOSIS — Z789 Other specified health status: Secondary | ICD-10-CM

## 2017-04-08 ENCOUNTER — Encounter: Payer: BLUE CROSS/BLUE SHIELD | Admitting: Certified Nurse Midwife

## 2017-04-22 ENCOUNTER — Encounter: Payer: Self-pay | Admitting: Certified Nurse Midwife

## 2017-04-22 ENCOUNTER — Ambulatory Visit (INDEPENDENT_AMBULATORY_CARE_PROVIDER_SITE_OTHER): Payer: BLUE CROSS/BLUE SHIELD | Admitting: Certified Nurse Midwife

## 2017-04-22 VITALS — BP 99/65 | HR 114 | Wt 138.5 lb

## 2017-04-22 DIAGNOSIS — F411 Generalized anxiety disorder: Secondary | ICD-10-CM

## 2017-04-22 DIAGNOSIS — Z3482 Encounter for supervision of other normal pregnancy, second trimester: Secondary | ICD-10-CM

## 2017-04-22 DIAGNOSIS — O99820 Streptococcus B carrier state complicating pregnancy: Secondary | ICD-10-CM

## 2017-04-22 DIAGNOSIS — Z789 Other specified health status: Secondary | ICD-10-CM

## 2017-04-22 DIAGNOSIS — Z3480 Encounter for supervision of other normal pregnancy, unspecified trimester: Secondary | ICD-10-CM

## 2017-04-22 LAB — OB RESULTS CONSOLE GBS: STREP GROUP B AG: POSITIVE

## 2017-04-22 NOTE — Progress Notes (Signed)
Patient is in the office for ob visit, denies pain and bleeding.

## 2017-04-22 NOTE — Progress Notes (Signed)
   PRENATAL VISIT NOTE  Subjective:  Katrina Blackwell is a 31 y.o. G2P1001 at 4280w2d being seen today for ongoing prenatal care.  She is currently monitored for the following issues for this low-risk pregnancy and has GBS (group B Streptococcus carrier), +RV culture, currently pregnant; Generalized anxiety disorder; Migraines; Language barrier; and Supervision of normal pregnancy, antepartum on her problem list.  Patient reports no complaints.  Contractions: Not present. Vag. Bleeding: None.   . Denies leaking of fluid.   The following portions of the patient's history were reviewed and updated as appropriate: allergies, current medications, past family history, past medical history, past social history, past surgical history and problem list. Problem list updated.  Objective:   Vitals:   04/22/17 1322  BP: 99/65  Pulse: (!) 114  Weight: 138 lb 8 oz (62.8 kg)    Fetal Status: Fetal Heart Rate (bpm): 150 Fundal Height: 17 cm       General:  Alert, oriented and cooperative. Patient is in no acute distress.  Skin: Skin is warm and dry. No rash noted.   Cardiovascular: Normal heart rate noted  Respiratory: Normal respiratory effort, no problems with respiration noted  Abdomen: Soft, gravid, appropriate for gestational age. Pain/Pressure: Absent     Pelvic:  Cervical exam deferred        Extremities: Normal range of motion.  Edema: None  Mental Status: Normal mood and affect. Normal behavior. Normal judgment and thought content.   Assessment and Plan:  Pregnancy: G2P1001 at 7680w2d  1. Supervision of other normal pregnancy, antepartum     [place] in [place].   - Culture, OB Urine - MaterniT21 PLUS Core+SCA - AFP, Serum, Open Spina Bifida  2. GBS (group B Streptococcus carrier), +RV culture, currently pregnant      PCN for labor/delivery  3. Generalized anxiety disorder       4. Language barrier    Speaks english  Preterm labor symptoms and general obstetric  precautions including but not limited to vaginal bleeding, contractions, leaking of fluid and fetal movement were reviewed in detail with the patient. Please refer to After Visit Summary for other counseling recommendations.  Return in about 2 months (around 06/22/2017) for ROB, 2 hr OGTT.   Roe Coombsachelle A Duston Smolenski, CNM

## 2017-04-25 LAB — AFP, SERUM, OPEN SPINA BIFIDA
AFP MoM: 1.11
AFP VALUE AFPOSL: 48.6 ng/mL
GEST. AGE ON COLLECTION DATE: 17.1 wk
MATERNAL AGE AT EDD: 29.5 a
OSBR Risk 1 IN: 10000
Test Results:: NEGATIVE
WEIGHT: 139 [lb_av]

## 2017-04-26 ENCOUNTER — Encounter (HOSPITAL_COMMUNITY): Payer: Self-pay | Admitting: Certified Nurse Midwife

## 2017-04-26 ENCOUNTER — Other Ambulatory Visit: Payer: Self-pay | Admitting: Certified Nurse Midwife

## 2017-04-26 DIAGNOSIS — Z3480 Encounter for supervision of other normal pregnancy, unspecified trimester: Secondary | ICD-10-CM

## 2017-04-27 LAB — URINE CULTURE, OB REFLEX

## 2017-04-27 LAB — CULTURE, OB URINE

## 2017-04-28 LAB — MATERNIT21 PLUS CORE+SCA
CHROMOSOME 13: NEGATIVE
CHROMOSOME 18: NEGATIVE
CHROMOSOME 21: NEGATIVE
Y Chromosome: DETECTED

## 2017-04-29 ENCOUNTER — Other Ambulatory Visit: Payer: Self-pay | Admitting: Certified Nurse Midwife

## 2017-04-29 DIAGNOSIS — Z3480 Encounter for supervision of other normal pregnancy, unspecified trimester: Secondary | ICD-10-CM

## 2017-05-06 ENCOUNTER — Ambulatory Visit (HOSPITAL_COMMUNITY): Admission: RE | Admit: 2017-05-06 | Payer: BLUE CROSS/BLUE SHIELD | Source: Ambulatory Visit

## 2017-07-03 ENCOUNTER — Ambulatory Visit (HOSPITAL_COMMUNITY): Admission: RE | Admit: 2017-07-03 | Payer: BLUE CROSS/BLUE SHIELD | Source: Ambulatory Visit

## 2017-07-17 ENCOUNTER — Other Ambulatory Visit: Payer: BLUE CROSS/BLUE SHIELD

## 2017-07-17 ENCOUNTER — Ambulatory Visit (INDEPENDENT_AMBULATORY_CARE_PROVIDER_SITE_OTHER): Payer: BLUE CROSS/BLUE SHIELD | Admitting: Certified Nurse Midwife

## 2017-07-17 VITALS — BP 99/65 | HR 101 | Wt 146.0 lb

## 2017-07-17 DIAGNOSIS — O99820 Streptococcus B carrier state complicating pregnancy: Secondary | ICD-10-CM

## 2017-07-17 DIAGNOSIS — Z3480 Encounter for supervision of other normal pregnancy, unspecified trimester: Secondary | ICD-10-CM

## 2017-07-17 DIAGNOSIS — Z789 Other specified health status: Secondary | ICD-10-CM

## 2017-07-17 NOTE — Progress Notes (Signed)
   PRENATAL VISIT NOTE  Subjective:  Katrina Blackwell is a 31 y.o. G2P1001 at [redacted]w[redacted]d being seen today for ongoing prenatal care.  She is currently monitored for the following issues for this low-risk pregnancy and has GBS (group B Streptococcus carrier), +RV culture, currently pregnant; Generalized anxiety disorder; Migraines; Language barrier; and Supervision of normal pregnancy, antepartum on her problem list.  Patient reports no complaints.  Contractions: Not present. Vag. Bleeding: None.  Movement: Present. Denies leaking of fluid.   The following portions of the patient's history were reviewed and updated as appropriate: allergies, current medications, past family history, past medical history, past social history, past surgical history and problem list. Problem list updated.  Objective:   Vitals:   07/17/17 0906  BP: 99/65  Pulse: (!) 101  Weight: 146 lb (66.2 kg)    Fetal Status: Fetal Heart Rate (bpm): 145; doppler Fundal Height: 28 cm Movement: Present     General:  Alert, oriented and cooperative. Patient is in no acute distress.  Skin: Skin is warm and dry. No rash noted.   Cardiovascular: Normal heart rate noted  Respiratory: Normal respiratory effort, no problems with respiration noted  Abdomen: Soft, gravid, appropriate for gestational age.  Pain/Pressure: Absent     Pelvic: Cervical exam deferred        Extremities: Normal range of motion.     Mental Status:  Normal mood and affect. Normal behavior. Normal judgment and thought content.   Assessment and Plan:  Pregnancy: G2P1001 at [redacted]w[redacted]d  1. Supervision of other normal pregnancy, antepartum     Doing well. 2 hour OGTT today.   2. Language barrier      Understands/speaks english well.  Interpreter declined.   3. GBS (group B Streptococcus carrier), +RV culture, currently pregnant     PCN for labor/delivery  Preterm labor symptoms and general obstetric precautions including but not limited to vaginal bleeding,  contractions, leaking of fluid and fetal movement were reviewed in detail with the patient. Please refer to After Visit Summary for other counseling recommendations.  Return in about 3 weeks (around 08/07/2017) for ROB.   Roe Coombs, CNM

## 2017-07-17 NOTE — Patient Instructions (Addendum)
Contraception Choices Contraception (birth control) is the use of any methods or devices to prevent pregnancy. Below are some methods to help avoid pregnancy. Hormonal methods  Contraceptive implant. This is a thin, plastic tube containing progesterone hormone. It does not contain estrogen hormone. Your health care provider inserts the tube in the inner part of the upper arm. The tube can remain in place for up to 3 years. After 3 years, the implant must be removed. The implant prevents the ovaries from releasing an egg (ovulation), thickens the cervical mucus to prevent sperm from entering the uterus, and thins the lining of the inside of the uterus.  Progesterone-only injections. These injections are given every 3 months by your health care provider to prevent pregnancy. This synthetic progesterone hormone stops the ovaries from releasing eggs. It also thickens cervical mucus and changes the uterine lining. This makes it harder for sperm to survive in the uterus.  Birth control pills. These pills contain estrogen and progesterone hormone. They work by preventing the ovaries from releasing eggs (ovulation). They also cause the cervical mucus to thicken, preventing the sperm from entering the uterus. Birth control pills are prescribed by a health care provider.Birth control pills can also be used to treat heavy periods.  Minipill. This type of birth control pill contains only the progesterone hormone. They are taken every day of each month and must be prescribed by your health care provider.  Birth control patch. The patch contains hormones similar to those in birth control pills. It must be changed once a week and is prescribed by a health care provider.  Vaginal ring. The ring contains hormones similar to those in birth control pills. It is left in the vagina for 3 weeks, removed for 1 week, and then a new one is put back in place. The patient must be comfortable inserting and removing the ring from  the vagina.A health care provider's prescription is necessary.  Emergency contraception. Emergency contraceptives prevent pregnancy after unprotected sexual intercourse. This pill can be taken right after sex or up to 5 days after unprotected sex. It is most effective the sooner you take the pills after having sexual intercourse. Most emergency contraceptive pills are available without a prescription. Check with your pharmacist. Do not use emergency contraception as your only form of birth control. Barrier methods  Female condom. This is a thin sheath (latex or rubber) that is worn over the penis during sexual intercourse. It can be used with spermicide to increase effectiveness.  Female condom. This is a soft, loose-fitting sheath that is put into the vagina before sexual intercourse.  Diaphragm. This is a soft, latex, dome-shaped barrier that must be fitted by a health care provider. It is inserted into the vagina, along with a spermicidal jelly. It is inserted before intercourse. The diaphragm should be left in the vagina for 6 to 8 hours after intercourse.  Cervical cap. This is a round, soft, latex or plastic cup that fits over the cervix and must be fitted by a health care provider. The cap can be left in place for up to 48 hours after intercourse.  Sponge. This is a soft, circular piece of polyurethane foam. The sponge has spermicide in it. It is inserted into the vagina after wetting it and before sexual intercourse.  Spermicides. These are chemicals that kill or block sperm from entering the cervix and uterus. They come in the form of creams, jellies, suppositories, foam, or tablets. They do not require a prescription. They   are inserted into the vagina with an applicator before having sexual intercourse. The process must be repeated every time you have sexual intercourse. Intrauterine contraception  Intrauterine device (IUD). This is a T-shaped device that is put in a woman's uterus during  a menstrual period to prevent pregnancy. There are 2 types: ? Copper IUD. This type of IUD is wrapped in copper wire and is placed inside the uterus. Copper makes the uterus and fallopian tubes produce a fluid that kills sperm. It can stay in place for 10 years. ? Hormone IUD. This type of IUD contains the hormone progestin (synthetic progesterone). The hormone thickens the cervical mucus and prevents sperm from entering the uterus, and it also thins the uterine lining to prevent implantation of a fertilized egg. The hormone can weaken or kill the sperm that get into the uterus. It can stay in place for 3-5 years, depending on which type of IUD is used. Permanent methods of contraception  Female tubal ligation. This is when the woman's fallopian tubes are surgically sealed, tied, or blocked to prevent the egg from traveling to the uterus.  Hysteroscopic sterilization. This involves placing a small coil or insert into each fallopian tube. Your doctor uses a technique called hysteroscopy to do the procedure. The device causes scar tissue to form. This results in permanent blockage of the fallopian tubes, so the sperm cannot fertilize the egg. It takes about 3 months after the procedure for the tubes to become blocked. You must use another form of birth control for these 3 months.  Female sterilization. This is when the female has the tubes that carry sperm tied off (vasectomy).This blocks sperm from entering the vagina during sexual intercourse. After the procedure, the man can still ejaculate fluid (semen). Natural planning methods  Natural family planning. This is not having sexual intercourse or using a barrier method (condom, diaphragm, cervical cap) on days the woman could become pregnant.  Calendar method. This is keeping track of the length of each menstrual cycle and identifying when you are fertile.  Ovulation method. This is avoiding sexual intercourse during ovulation.  Symptothermal method.  This is avoiding sexual intercourse during ovulation, using a thermometer and ovulation symptoms.  Post-ovulation method. This is timing sexual intercourse after you have ovulated. Regardless of which type or method of contraception you choose, it is important that you use condoms to protect against the transmission of sexually transmitted infections (STIs). Talk with your health care provider about which form of contraception is most appropriate for you. This information is not intended to replace advice given to you by your health care provider. Make sure you discuss any questions you have with your health care provider. Document Released: 11/05/2005 Document Revised: 04/12/2016 Document Reviewed: 04/30/2013 Elsevier Interactive Patient Education  2017 Elsevier Inc.  

## 2017-07-18 ENCOUNTER — Other Ambulatory Visit: Payer: Self-pay | Admitting: Certified Nurse Midwife

## 2017-07-18 DIAGNOSIS — O24410 Gestational diabetes mellitus in pregnancy, diet controlled: Secondary | ICD-10-CM

## 2017-07-18 DIAGNOSIS — O0990 Supervision of high risk pregnancy, unspecified, unspecified trimester: Secondary | ICD-10-CM

## 2017-07-18 DIAGNOSIS — O24419 Gestational diabetes mellitus in pregnancy, unspecified control: Secondary | ICD-10-CM | POA: Insufficient documentation

## 2017-07-18 LAB — CBC
Hematocrit: 33.1 % — ABNORMAL LOW (ref 34.0–46.6)
Hemoglobin: 10.8 g/dL — ABNORMAL LOW (ref 11.1–15.9)
MCH: 28.6 pg (ref 26.6–33.0)
MCHC: 32.6 g/dL (ref 31.5–35.7)
MCV: 88 fL (ref 79–97)
PLATELETS: 208 10*3/uL (ref 150–379)
RBC: 3.78 x10E6/uL (ref 3.77–5.28)
RDW: 13.6 % (ref 12.3–15.4)
WBC: 7.5 10*3/uL (ref 3.4–10.8)

## 2017-07-18 LAB — GLUCOSE TOLERANCE, 2 HOURS W/ 1HR
Glucose, 1 hour: 148 mg/dL (ref 65–179)
Glucose, 2 hour: 171 mg/dL — ABNORMAL HIGH (ref 65–152)
Glucose, Fasting: 83 mg/dL (ref 65–91)

## 2017-07-18 LAB — HIV ANTIBODY (ROUTINE TESTING W REFLEX): HIV Screen 4th Generation wRfx: NONREACTIVE

## 2017-07-18 LAB — RPR: RPR Ser Ql: NONREACTIVE

## 2017-07-24 ENCOUNTER — Other Ambulatory Visit: Payer: Self-pay

## 2017-07-24 DIAGNOSIS — O24419 Gestational diabetes mellitus in pregnancy, unspecified control: Secondary | ICD-10-CM

## 2017-07-24 MED ORDER — ACCU-CHEK GUIDE W/DEVICE KIT: 1.0000 | PACK | Freq: Four times a day (QID) | 0 refills | Status: DC

## 2017-07-24 MED ORDER — GLUCOSE BLOOD VI STRP: ORAL_STRIP | 12 refills | Status: DC

## 2017-07-24 NOTE — Progress Notes (Unsigned)
Rx sent 

## 2017-07-25 ENCOUNTER — Other Ambulatory Visit: Payer: Self-pay | Admitting: Certified Nurse Midwife

## 2017-07-25 ENCOUNTER — Ambulatory Visit (HOSPITAL_COMMUNITY)
Admission: RE | Admit: 2017-07-25 | Discharge: 2017-07-25 | Disposition: A | Discharge status: Home / Self Care | Payer: BLUE CROSS/BLUE SHIELD | Source: Ambulatory Visit | Attending: Certified Nurse Midwife | Admitting: Certified Nurse Midwife

## 2017-07-25 DIAGNOSIS — O24419 Gestational diabetes mellitus in pregnancy, unspecified control: Secondary | ICD-10-CM | POA: Insufficient documentation

## 2017-07-25 DIAGNOSIS — Z3480 Encounter for supervision of other normal pregnancy, unspecified trimester: Secondary | ICD-10-CM

## 2017-07-25 DIAGNOSIS — Z3A30 30 weeks gestation of pregnancy: Secondary | ICD-10-CM

## 2017-07-25 DIAGNOSIS — Z3689 Encounter for other specified antenatal screening: Secondary | ICD-10-CM | POA: Diagnosis present

## 2017-08-03 ENCOUNTER — Other Ambulatory Visit: Payer: Self-pay | Admitting: Certified Nurse Midwife

## 2017-08-03 DIAGNOSIS — O0990 Supervision of high risk pregnancy, unspecified, unspecified trimester: Secondary | ICD-10-CM

## 2017-08-07 ENCOUNTER — Encounter: Payer: BLUE CROSS/BLUE SHIELD | Attending: Certified Nurse Midwife | Admitting: Registered"

## 2017-08-07 ENCOUNTER — Ambulatory Visit (INDEPENDENT_AMBULATORY_CARE_PROVIDER_SITE_OTHER): Payer: BLUE CROSS/BLUE SHIELD | Admitting: Certified Nurse Midwife

## 2017-08-07 ENCOUNTER — Encounter: Payer: Self-pay | Admitting: Certified Nurse Midwife

## 2017-08-07 VITALS — BP 103/72 | HR 107 | Wt 147.6 lb

## 2017-08-07 DIAGNOSIS — Z3A00 Weeks of gestation of pregnancy not specified: Secondary | ICD-10-CM | POA: Diagnosis not present

## 2017-08-07 DIAGNOSIS — O24410 Gestational diabetes mellitus in pregnancy, diet controlled: Secondary | ICD-10-CM | POA: Diagnosis present

## 2017-08-07 DIAGNOSIS — R7309 Other abnormal glucose: Secondary | ICD-10-CM

## 2017-08-07 DIAGNOSIS — Z713 Dietary counseling and surveillance: Secondary | ICD-10-CM | POA: Diagnosis not present

## 2017-08-07 DIAGNOSIS — Z6823 Body mass index (BMI) 23.0-23.9, adult: Secondary | ICD-10-CM | POA: Insufficient documentation

## 2017-08-07 DIAGNOSIS — O0990 Supervision of high risk pregnancy, unspecified, unspecified trimester: Secondary | ICD-10-CM

## 2017-08-07 NOTE — Progress Notes (Signed)
   PRENATAL VISIT NOTE  Subjective:  Katrina Blackwell is a 31 y.o. G2P1001 at [redacted]w[redacted]d being seen today for ongoing prenatal care.  She is currently monitored for the following issues for this high-risk pregnancy and has GBS (group B Streptococcus carrier), +RV culture, currently pregnant; Generalized anxiety disorder; Migraines; Language barrier; Supervision of high risk pregnancy, antepartum; and GDM (gestational diabetes mellitus) on her problem list.  Patient reports no complaints.  Contractions: Not present. Vag. Bleeding: None.  Movement: Present. Denies leaking of fluid.   The following portions of the patient's history were reviewed and updated as appropriate: allergies, current medications, past family history, past medical history, past social history, past surgical history and problem list. Problem list updated.  Objective:   Vitals:   08/07/17 1101  BP: 103/72  Pulse: (!) 107  Weight: 147 lb 9.6 oz (67 kg)    Fetal Status: Fetal Heart Rate (bpm): 148; doppler Fundal Height: 32 cm Movement: Present     General:  Alert, oriented and cooperative. Patient is in no acute distress.  Skin: Skin is warm and dry. No rash noted.   Cardiovascular: Normal heart rate noted  Respiratory: Normal respiratory effort, no problems with respiration noted  Abdomen: Soft, gravid, appropriate for gestational age.  Pain/Pressure: Absent     Pelvic: Cervical exam deferred        Extremities: Normal range of motion.  Edema: None  Mental Status:  Normal mood and affect. Normal behavior. Normal judgment and thought content.   Assessment and Plan:  Pregnancy: G2P1001 at [redacted]w[redacted]d  1. Supervision of high risk pregnancy, antepartum     Doing well.  Has GDM class today.  Has meter/supplies.   2. Diet controlled gestational diabetes mellitus (GDM) in third trimester     Will bring CBG log in 2 weeks  Preterm labor symptoms and general obstetric precautions including but not limited to vaginal bleeding,  contractions, leaking of fluid and fetal movement were reviewed in detail with the patient. Please refer to After Visit Summary for other counseling recommendations.  Return in about 2 weeks (around 08/21/2017) for [facility].   Roe Coombs, CNM

## 2017-08-07 NOTE — Progress Notes (Signed)
Declined the FLU vaccine. 

## 2017-08-12 ENCOUNTER — Other Ambulatory Visit: Payer: Self-pay | Admitting: Certified Nurse Midwife

## 2017-08-12 ENCOUNTER — Encounter: Payer: Self-pay | Admitting: Registered"

## 2017-08-12 DIAGNOSIS — O24410 Gestational diabetes mellitus in pregnancy, diet controlled: Secondary | ICD-10-CM

## 2017-08-12 DIAGNOSIS — R7309 Other abnormal glucose: Secondary | ICD-10-CM | POA: Insufficient documentation

## 2017-08-12 NOTE — Progress Notes (Signed)
Patient was seen on 08/07/2017 for Gestational Diabetes self-management class at the [facility]. The following learning objectives were met by the patient during this course:   States the definition of Gestational Diabetes  States why dietary management is important in controlling blood glucose  Describes the effects each nutrient has on blood glucose levels  Demonstrates ability to create a balanced meal plan  Demonstrates carbohydrate counting   States when to check blood glucose levels  Demonstrates proper blood glucose monitoring techniques  States the effect of stress and exercise on blood glucose levels  States the importance of limiting caffeine and abstaining from alcohol and smoking  Blood glucose monitor given: none Lot # n/a Exp: n/a Blood glucose reading: n/a  Patient instructed to monitor glucose levels: FBS: 60 - <95 1 hour: <140 2 hour: <120  Patient received handouts:  Nutrition Diabetes and Pregnancy  Carbohydrate Counting List  Patient will be seen for follow-up as needed.

## 2017-08-13 ENCOUNTER — Other Ambulatory Visit: Payer: Self-pay | Admitting: Certified Nurse Midwife

## 2017-08-13 DIAGNOSIS — O24419 Gestational diabetes mellitus in pregnancy, unspecified control: Secondary | ICD-10-CM

## 2017-08-21 ENCOUNTER — Ambulatory Visit (INDEPENDENT_AMBULATORY_CARE_PROVIDER_SITE_OTHER): Payer: BLUE CROSS/BLUE SHIELD | Admitting: Certified Nurse Midwife

## 2017-08-21 VITALS — BP 103/73 | HR 109 | Wt 147.0 lb

## 2017-08-21 DIAGNOSIS — O24415 Gestational diabetes mellitus in pregnancy, controlled by oral hypoglycemic drugs: Secondary | ICD-10-CM

## 2017-08-21 DIAGNOSIS — Z789 Other specified health status: Secondary | ICD-10-CM

## 2017-08-21 DIAGNOSIS — O0990 Supervision of high risk pregnancy, unspecified, unspecified trimester: Secondary | ICD-10-CM

## 2017-08-21 DIAGNOSIS — O99820 Streptococcus B carrier state complicating pregnancy: Secondary | ICD-10-CM

## 2017-08-21 DIAGNOSIS — O0993 Supervision of high risk pregnancy, unspecified, third trimester: Secondary | ICD-10-CM

## 2017-08-21 DIAGNOSIS — Z758 Other problems related to medical facilities and other health care: Secondary | ICD-10-CM

## 2017-08-21 DIAGNOSIS — O24410 Gestational diabetes mellitus in pregnancy, diet controlled: Secondary | ICD-10-CM

## 2017-08-21 MED ORDER — ACCU-CHEK MULTICLIX LANCETS MISC: 5 refills | Status: DC

## 2017-08-21 MED ORDER — GLYBURIDE 2.5 MG PO TABS: 2.5000 mg | ORAL_TABLET | Freq: Every day | ORAL | 3 refills | Status: DC

## 2017-08-21 NOTE — Progress Notes (Signed)
Patient complains of pain in her left leg and her heart has being racing all day.

## 2017-08-22 MED ORDER — ACCU-CHEK MULTICLIX LANCETS MISC: 5 refills | Status: DC

## 2017-08-22 NOTE — Progress Notes (Signed)
   PRENATAL VISIT NOTE  Subjective:  Katrina Blackwell is a 31 y.o. G2P1001 at [redacted]w[redacted]d being seen today for ongoing prenatal care.  She is currently monitored for the following issues for this high-risk pregnancy and has GBS (group B Streptococcus carrier), +RV culture, currently pregnant; Generalized anxiety disorder; Migraines; Language barrier; Supervision of high risk pregnancy, antepartum; GDM (gestational diabetes mellitus); and Impaired glucose tolerance test on her problem list.  Patient reports no bleeding, no contractions, no cramping and no leaking.  Contractions: Not present. Vag. Bleeding: None.  Movement: Present. Denies leaking of fluid.   The following portions of the patient's history were reviewed and updated as appropriate: allergies, current medications, past family history, past medical history, past social history, past surgical history and problem list. Problem list updated.  Objective:   Vitals:   08/21/17 1412  BP: 103/73  Pulse: (!) 109  Weight: 147 lb (66.7 kg)    Fetal Status: Fetal Heart Rate (bpm): 152; doppler Fundal Height: 35 cm Movement: Present     General:  Alert, oriented and cooperative. Patient is in no acute distress.  Skin: Skin is warm and dry. No rash noted.   Cardiovascular: Normal heart rate noted  Respiratory: Normal respiratory effort, no problems with respiration noted  Abdomen: Soft, gravid, appropriate for gestational age.  Pain/Pressure: Present     Pelvic: Cervical exam deferred        Extremities: Normal range of motion.  Edema: None  Mental Status:  Normal mood and affect. Normal behavior. Normal judgment and thought content.    Fastings;    79-92; 99, 101, 97X1 each    2 hour PP:    112-160's, 190X1    HS:     113-162; mostly elevated  Assessment and Plan:  Pregnancy: G2P1001 at [redacted]w[redacted]d  1. Diet controlled gestational diabetes mellitus (GDM) in third trimester      Started on Glyburide 2.5 mg in AM today.  Discussed with Dr. Alysia Penna.   - Lancets (ACCU-CHEK MULTICLIX) lancets; Test blood glucose 4 times daily.  Use as instructed  Dispense: 100 each; Refill: 5  2. GBS (group B Streptococcus carrier), +RV culture, currently pregnant     PCN for labor/delivery  3. Language barrier     Interpreter declined.   4. Supervision of high risk pregnancy, antepartum     Last [place] was 07/25/17: EFW: 71%.  F/U growth around 36 weeks scheduled.   5. Gestational diabetes mellitus (GDM) in third trimester controlled on oral hypoglycemic drug     - glyBURIDE (DIABETA) 2.5 MG tablet; Take 1 tablet (2.5 mg total) by mouth daily with breakfast.  Dispense: 30 tablet; Refill: 3  Preterm labor symptoms and general obstetric precautions including but not limited to vaginal bleeding, contractions, leaking of fluid and fetal movement were reviewed in detail with the patient. Please refer to After Visit Summary for other counseling recommendations.  Return in about 1 week (around 08/28/2017) for ROB, Needs to see FP MD here, Female providers only, Bi-weekly Antenatal testing.   Roe Coombs, CNM

## 2017-08-22 NOTE — Addendum Note (Signed)
Addended by: Marya Landry D on: 08/22/2017 04:22 PM   Modules accepted: Orders

## 2017-08-27 ENCOUNTER — Other Ambulatory Visit: Payer: BLUE CROSS/BLUE SHIELD

## 2017-08-27 ENCOUNTER — Encounter: Payer: BLUE CROSS/BLUE SHIELD | Admitting: Obstetrics and Gynecology

## 2017-08-27 ENCOUNTER — Ambulatory Visit: Payer: Self-pay

## 2017-08-27 ENCOUNTER — Ambulatory Visit (INDEPENDENT_AMBULATORY_CARE_PROVIDER_SITE_OTHER): Payer: BLUE CROSS/BLUE SHIELD | Admitting: *Deleted

## 2017-08-27 ENCOUNTER — Ambulatory Visit (INDEPENDENT_AMBULATORY_CARE_PROVIDER_SITE_OTHER): Payer: BLUE CROSS/BLUE SHIELD | Admitting: Obstetrics and Gynecology

## 2017-08-27 ENCOUNTER — Other Ambulatory Visit (HOSPITAL_COMMUNITY)
Admission: RE | Admit: 2017-08-27 | Discharge: 2017-08-27 | Disposition: A | Discharge status: Home / Self Care | Payer: BLUE CROSS/BLUE SHIELD | Source: Ambulatory Visit | Attending: Obstetrics and Gynecology | Admitting: Obstetrics and Gynecology

## 2017-08-27 VITALS — BP 109/75 | HR 95

## 2017-08-27 VITALS — BP 105/69 | HR 108 | Wt 152.8 lb

## 2017-08-27 DIAGNOSIS — Z113 Encounter for screening for infections with a predominantly sexual mode of transmission: Secondary | ICD-10-CM

## 2017-08-27 DIAGNOSIS — Z1389 Encounter for screening for other disorder: Secondary | ICD-10-CM

## 2017-08-27 DIAGNOSIS — O24415 Gestational diabetes mellitus in pregnancy, controlled by oral hypoglycemic drugs: Secondary | ICD-10-CM | POA: Diagnosis not present

## 2017-08-27 DIAGNOSIS — O0993 Supervision of high risk pregnancy, unspecified, third trimester: Secondary | ICD-10-CM

## 2017-08-27 DIAGNOSIS — Z3483 Encounter for supervision of other normal pregnancy, third trimester: Secondary | ICD-10-CM

## 2017-08-27 DIAGNOSIS — Z3480 Encounter for supervision of other normal pregnancy, unspecified trimester: Secondary | ICD-10-CM

## 2017-08-27 DIAGNOSIS — O24410 Gestational diabetes mellitus in pregnancy, diet controlled: Secondary | ICD-10-CM

## 2017-08-27 DIAGNOSIS — O0990 Supervision of high risk pregnancy, unspecified, unspecified trimester: Secondary | ICD-10-CM

## 2017-08-27 DIAGNOSIS — O99820 Streptococcus B carrier state complicating pregnancy: Secondary | ICD-10-CM

## 2017-08-27 NOTE — Progress Notes (Signed)
Patient reports good fetal movement with irregular contractions.  

## 2017-08-27 NOTE — Patient Instructions (Signed)

## 2017-08-27 NOTE — Progress Notes (Signed)
Pt informed that the ultrasound is considered a limited OB ultrasound and is not intended to be a complete ultrasound exam.  Patient also informed that the ultrasound is not being completed with the intent of assessing for fetal or placental anomalies or any pelvic abnormalities.  Explained that the purpose of today's ultrasound is to assess for presentation, BPP and amniotic fluid volume.  Patient acknowledges the purpose of the exam and the limitations of the study.    Pt has [place] for growth on 10/18, BPP added. She will have NST @ [place] office on 10/18 as well. Next appt in this office for NST/BPP on 10/24

## 2017-08-27 NOTE — Progress Notes (Addendum)
   PRENATAL VISIT NOTE  Subjective:  Katrina Blackwell is a 31 y.o. G2P1001 at 104w3d being seen today for ongoing prenatal care.  She is currently monitored for the following issues for this high-risk pregnancy and has GBS (group B Streptococcus carrier), +RV culture, currently pregnant; Generalized anxiety disorder; Migraines; Language barrier; Supervision of high risk pregnancy, antepartum; GDM (gestational diabetes mellitus); and Impaired glucose tolerance test on her problem list.  Patient reports occasional contractions.  Contractions: Irregular. Vag. Bleeding: None.  Movement: Present. Denies leaking of fluid.   On glyburide 2.5 in am Fasting: 70s PP: 80-90s  The following portions of the patient's history were reviewed and updated as appropriate: allergies, current medications, past family history, past medical history, past social history, past surgical history and problem list. Problem list updated.  Objective:   Vitals:   08/27/17 1430  BP: 105/69  Pulse: (!) 108  Weight: 152 lb 12.8 oz (69.3 kg)    Fetal Status:     Movement: Present    FH 35 FHT 156  General:  Alert, oriented and cooperative. Patient is in no acute distress.  Skin: Skin is warm and dry. No rash noted.   Cardiovascular: Normal heart rate noted  Respiratory: Normal respiratory effort, no problems with respiration noted  Abdomen: Soft, gravid, appropriate for gestational age.  Pain/Pressure: Present     Pelvic: Cervical exam deferred        Extremities: Normal range of motion.  Edema: None  Mental Status:  Normal mood and affect. Normal behavior. Normal judgment and thought content.   Assessment and Plan:  Pregnancy: G2P1001 at [redacted]w[redacted]d  1. Supervision of other normal pregnancy, antepartum - Cervicovaginal ancillary only  2. gDMA2 Sugars well controlled Cont glyburide [place] 10/18  3. GBS bacteruria - needs ppx  Preterm labor symptoms and general obstetric precautions including but not limited to vaginal  bleeding, contractions, leaking of fluid and fetal movement were reviewed in detail with the patient. Please refer to After Visit Summary for other counseling recommendations.  No Follow-up on file.   Conan Bowens, MD

## 2017-08-28 LAB — CERVICOVAGINAL ANCILLARY ONLY
BACTERIAL VAGINITIS: NEGATIVE
CHLAMYDIA, DNA PROBE: NEGATIVE
Candida vaginitis: NEGATIVE
NEISSERIA GONORRHEA: NEGATIVE
Trichomonas: NEGATIVE

## 2017-09-05 ENCOUNTER — Ambulatory Visit (INDEPENDENT_AMBULATORY_CARE_PROVIDER_SITE_OTHER): Payer: BLUE CROSS/BLUE SHIELD | Admitting: Obstetrics and Gynecology

## 2017-09-05 ENCOUNTER — Ambulatory Visit (HOSPITAL_COMMUNITY)
Admission: RE | Admit: 2017-09-05 | Discharge: 2017-09-05 | Disposition: A | Discharge status: Home / Self Care | Payer: BLUE CROSS/BLUE SHIELD | Source: Ambulatory Visit | Attending: Certified Nurse Midwife | Admitting: Certified Nurse Midwife

## 2017-09-05 VITALS — BP 100/74 | HR 102 | Wt 152.0 lb

## 2017-09-05 DIAGNOSIS — O0990 Supervision of high risk pregnancy, unspecified, unspecified trimester: Secondary | ICD-10-CM

## 2017-09-05 DIAGNOSIS — O0993 Supervision of high risk pregnancy, unspecified, third trimester: Secondary | ICD-10-CM | POA: Diagnosis not present

## 2017-09-05 DIAGNOSIS — Z3A36 36 weeks gestation of pregnancy: Secondary | ICD-10-CM | POA: Diagnosis not present

## 2017-09-05 DIAGNOSIS — Z362 Encounter for other antenatal screening follow-up: Secondary | ICD-10-CM | POA: Insufficient documentation

## 2017-09-05 DIAGNOSIS — O24410 Gestational diabetes mellitus in pregnancy, diet controlled: Secondary | ICD-10-CM

## 2017-09-05 DIAGNOSIS — O24415 Gestational diabetes mellitus in pregnancy, controlled by oral hypoglycemic drugs: Secondary | ICD-10-CM

## 2017-09-05 DIAGNOSIS — O99820 Streptococcus B carrier state complicating pregnancy: Secondary | ICD-10-CM

## 2017-09-05 NOTE — Progress Notes (Signed)
Subjective:  Katrina Blackwell is a 31 y.o. G2P1001 at 535w5d being seen today for ongoing prenatal care.  She is currently monitored for the following issues for this high-risk pregnancy and has GBS (group B Streptococcus carrier), +RV culture, currently pregnant; Generalized anxiety disorder; Migraines; Language barrier; Supervision of high risk pregnancy, antepartum; GDM (gestational diabetes mellitus); and Impaired glucose tolerance test on her problem list.  Patient reports heartburn.  Contractions: Irregular. Vag. Bleeding: None.  Movement: Present. Denies leaking of fluid.   The following portions of the patient's history were reviewed and updated as appropriate: allergies, current medications, past family history, past medical history, past social history, past surgical history and problem list. Problem list updated.  Objective:   Vitals:   09/05/17 1510  BP: 100/74  Pulse: (!) 102  Weight: 152 lb (68.9 kg)    Fetal Status: Fetal Heart Rate (bpm): NST    Movement: Present     General:  Alert, oriented and cooperative. Patient is in no acute distress.  Skin: Skin is warm and dry. No rash noted.   Cardiovascular: Normal heart rate noted  Respiratory: Normal respiratory effort, no problems with respiration noted  Abdomen: Soft, gravid, appropriate for gestational age. Pain/Pressure: Present     Pelvic:  Cervical exam performed        Extremities: Normal range of motion.  Edema: None  Mental Status: Normal mood and affect. Normal behavior. Normal judgment and thought content.   Urinalysis:      Assessment and Plan:  Pregnancy: G2P1001 at 4935w5d  1. Diet controlled gestational diabetes mellitus (GDM) in third trimester BS in goal range Continue with Glyburide Reactive NST today, BPP 8/8 EFW 3309 gms 85% Contiue with twice weekly testing - Fetal nonstress test  2. Supervision of high risk pregnancy, antepartum Labor precautions  3. GBS (group B Streptococcus carrier), +RV  culture, currently pregnant Tx while in labor  Term labor symptoms and general obstetric precautions including but not limited to vaginal bleeding, contractions, leaking of fluid and fetal movement were reviewed in detail with the patient. Please refer to After Visit Summary for other counseling recommendations.  Return in about 1 week (around 09/12/2017) for OB visit.   Hermina StaggersErvin, Johnluke Haugen L, MD

## 2017-09-05 NOTE — Patient Instructions (Signed)
Vaginal Delivery Vaginal delivery means that you will give birth by pushing your baby out of your birth canal (vagina). A team of health care providers will help you before, during, and after vaginal delivery. Birth experiences are unique for every woman and every pregnancy, and birth experiences vary depending on where you choose to give birth. What should I do to prepare for my baby's birth? Before your baby is born, it is important to talk with your health care provider about:  Your labor and delivery preferences. These may include: ? Medicines that you may be given. ? How you will manage your pain. This might include non-medical pain relief techniques or injectable pain relief such as epidural analgesia. ? How you and your baby will be monitored during labor and delivery. ? Who may be in the labor and delivery room with you. ? Your feelings about surgical delivery of your baby (cesarean delivery, or C-section) if this becomes necessary. ? Your feelings about receiving donated blood through an IV tube (blood transfusion) if this becomes necessary.  Whether you are able: ? To take pictures or videos of the birth. ? To eat during labor and delivery. ? To move around, walk, or change positions during labor and delivery.  What to expect after your baby is born, such as: ? Whether delayed umbilical cord clamping and cutting is offered. ? Who will care for your baby right after birth. ? Medicines or tests that may be recommended for your baby. ? Whether breastfeeding is supported in your hospital or birth center. ? How long you will be in the hospital or birth center.  How any medical conditions you have may affect your baby or your labor and delivery experience.  To prepare for your baby's birth, you should also:  Attend all of your health care visits before delivery (prenatal visits) as recommended by your health care provider. This is important.  Prepare your home for your baby's  arrival. Make sure that you have: ? Diapers. ? Baby clothing. ? Feeding equipment. ? Safe sleeping arrangements for you and your baby.  Install a car seat in your vehicle. Have your car seat checked by a certified car seat installer to make sure that it is installed safely.  Think about who will help you with your new baby at home for at least the first several weeks after delivery.  What can I expect when I arrive at the birth center or hospital? Once you are in labor and have been admitted into the hospital or birth center, your health care provider may:  Review your pregnancy history and any concerns you have.  Insert an IV tube into one of your veins. This is used to give you fluids and medicines.  Check your blood pressure, pulse, temperature, and heart rate (vital signs).  Check whether your bag of water (amniotic sac) has broken (ruptured).  Talk with you about your birth plan and discuss pain control options.  Monitoring Your health care provider may monitor your contractions (uterine monitoring) and your baby's heart rate (fetal monitoring). You may need to be monitored:  Often, but not continuously (intermittently).  All the time or for long periods at a time (continuously). Continuous monitoring may be needed if: ? You are taking certain medicines, such as medicine to relieve pain or make your contractions stronger. ? You have pregnancy or labor complications.  Monitoring may be done by:  Placing a special stethoscope or a handheld monitoring device on your abdomen to   check your baby's heartbeat, and feeling your abdomen for contractions. This method of monitoring does not continuously record your baby's heartbeat or your contractions.  Placing monitors on your abdomen (external monitors) to record your baby's heartbeat and the frequency and length of contractions. You may not have to wear external monitors all the time.  Placing monitors inside of your uterus  (internal monitors) to record your baby's heartbeat and the frequency, length, and strength of your contractions. ? Your health care provider may use internal monitors if he or she needs more information about the strength of your contractions or your baby's heart rate. ? Internal monitors are put in place by passing a thin, flexible wire through your vagina and into your uterus. Depending on the type of monitor, it may remain in your uterus or on your baby's head until birth. ? Your health care provider will discuss the benefits and risks of internal monitoring with you and will ask for your permission before inserting the monitors.  Telemetry. This is a type of continuous monitoring that can be done with external or internal monitors. Instead of having to stay in bed, you are able to move around during telemetry. Ask your health care provider if telemetry is an option for you.  Physical exam Your health care provider may perform a physical exam. This may include:  Checking whether your baby is positioned: ? With the head toward your vagina (head-down). This is most common. ? With the head toward the top of your uterus (head-up or breech). If your baby is in a breech position, your health care provider may try to turn your baby to a head-down position so you can deliver vaginally. If it does not seem that your baby can be born vaginally, your provider may recommend surgery to deliver your baby. In rare cases, you may be able to deliver vaginally if your baby is head-up (breech delivery). ? Lying sideways (transverse). Babies that are lying sideways cannot be delivered vaginally.  Checking your cervix to determine: ? Whether it is thinning out (effacing). ? Whether it is opening up (dilating). ? How low your baby has moved into your birth canal.  What are the three stages of labor and delivery?  Normal labor and delivery is divided into the following three stages: Stage 1  Stage 1 is the  longest stage of labor, and it can last for hours or days. Stage 1 includes: ? Early labor. This is when contractions may be irregular, or regular and mild. Generally, early labor contractions are more than 10 minutes apart. ? Active labor. This is when contractions get longer, more regular, more frequent, and more intense. ? The transition phase. This is when contractions happen very close together, are very intense, and may last longer than during any other part of labor.  Contractions generally feel mild, infrequent, and irregular at first. They get stronger, more frequent (about every 2-3 minutes), and more regular as you progress from early labor through active labor and transition.  Many women progress through stage 1 naturally, but you may need help to continue making progress. If this happens, your health care provider may talk with you about: ? Rupturing your amniotic sac if it has not ruptured yet. ? Giving you medicine to help make your contractions stronger and more frequent.  Stage 1 ends when your cervix is completely dilated to 4 inches (10 cm) and completely effaced. This happens at the end of the transition phase. Stage 2  Once   your cervix is completely effaced and dilated to 4 inches (10 cm), you may start to feel an urge to push. It is common for the body to naturally take a rest before feeling the urge to push, especially if you received an epidural or certain other pain medicines. This rest period may last for up to 1-2 hours, depending on your unique labor experience.  During stage 2, contractions are generally less painful, because pushing helps relieve contraction pain. Instead of contraction pain, you may feel stretching and burning pain, especially when the widest part of your baby's head passes through the vaginal opening (crowning).  Your health care provider will closely monitor your pushing progress and your baby's progress through the vagina during stage 2.  Your  health care provider may massage the area of skin between your vaginal opening and anus (perineum) or apply warm compresses to your perineum. This helps it stretch as the baby's head starts to crown, which can help prevent perineal tearing. ? In some cases, an incision may be made in your perineum (episiotomy) to allow the baby to pass through the vaginal opening. An episiotomy helps to make the opening of the vagina larger to allow more room for the baby to fit through.  It is very important to breathe and focus so your health care provider can control the delivery of your baby's head. Your health care provider may have you decrease the intensity of your pushing, to help prevent perineal tearing.  After delivery of your baby's head, the shoulders and the rest of the body generally deliver very quickly and without difficulty.  Once your baby is delivered, the umbilical cord may be cut right away, or this may be delayed for 1-2 minutes, depending on your baby's health. This may vary among health care providers, hospitals, and birth centers.  If you and your baby are healthy enough, your baby may be placed on your chest or abdomen to help maintain the baby's temperature and to help you bond with each other. Some mothers and babies start breastfeeding at this time. Your health care team will dry your baby and help keep your baby warm during this time.  Your baby may need immediate care if he or she: ? Showed signs of distress during labor. ? Has a medical condition. ? Was born too early (prematurely). ? Had a bowel movement before birth (meconium). ? Shows signs of difficulty transitioning from being inside the uterus to being outside of the uterus. If you are planning to breastfeed, your health care team will help you begin a feeding. Stage 3  The third stage of labor starts immediately after the birth of your baby and ends after you deliver the placenta. The placenta is an organ that develops  during pregnancy to provide oxygen and nutrients to your baby in the womb.  Delivering the placenta may require some pushing, and you may have mild contractions. Breastfeeding can stimulate contractions to help you deliver the placenta.  After the placenta is delivered, your uterus should tighten (contract) and become firm. This helps to stop bleeding in your uterus. To help your uterus contract and to control bleeding, your health care provider may: ? Give you medicine by injection, through an IV tube, by mouth, or through your rectum (rectally). ? Massage your abdomen or perform a vaginal exam to remove any blood clots that are left in your uterus. ? Empty your bladder by placing a thin, flexible tube (catheter) into your bladder. ? Encourage   you to breastfeed your baby. After labor is over, you and your baby will be monitored closely to ensure that you are both healthy until you are ready to go home. Your health care team will teach you how to care for yourself and your baby. This information is not intended to replace advice given to you by your health care provider. Make sure you discuss any questions you have with your health care provider. Document Released: 08/14/2008 Document Revised: 05/25/2016 Document Reviewed: 11/20/2015 Elsevier Interactive Patient Education  2018 [employer]  

## 2017-09-05 NOTE — Progress Notes (Signed)
Pt c/o heartburn 

## 2017-09-09 ENCOUNTER — Inpatient Hospital Stay (HOSPITAL_COMMUNITY): Payer: BLUE CROSS/BLUE SHIELD | Admitting: Anesthesiology

## 2017-09-09 ENCOUNTER — Encounter (HOSPITAL_COMMUNITY): Payer: Self-pay | Admitting: *Deleted

## 2017-09-09 ENCOUNTER — Inpatient Hospital Stay (HOSPITAL_COMMUNITY)
Admission: AD | Admit: 2017-09-09 | Discharge: 2017-09-11 | DRG: 807 | Disposition: A | Discharge status: Home / Self Care | Payer: BLUE CROSS/BLUE SHIELD | Source: Ambulatory Visit | Attending: Family Medicine | Admitting: Family Medicine

## 2017-09-09 DIAGNOSIS — D649 Anemia, unspecified: Secondary | ICD-10-CM | POA: Diagnosis present

## 2017-09-09 DIAGNOSIS — O99824 Streptococcus B carrier state complicating childbirth: Secondary | ICD-10-CM | POA: Diagnosis present

## 2017-09-09 DIAGNOSIS — O99820 Streptococcus B carrier state complicating pregnancy: Secondary | ICD-10-CM

## 2017-09-09 DIAGNOSIS — Z3A39 39 weeks gestation of pregnancy: Secondary | ICD-10-CM | POA: Diagnosis not present

## 2017-09-09 DIAGNOSIS — O9902 Anemia complicating childbirth: Secondary | ICD-10-CM | POA: Diagnosis present

## 2017-09-09 DIAGNOSIS — O24425 Gestational diabetes mellitus in childbirth, controlled by oral hypoglycemic drugs: Principal | ICD-10-CM | POA: Diagnosis present

## 2017-09-09 DIAGNOSIS — O4292 Full-term premature rupture of membranes, unspecified as to length of time between rupture and onset of labor: Secondary | ICD-10-CM | POA: Diagnosis present

## 2017-09-09 DIAGNOSIS — Z3A37 37 weeks gestation of pregnancy: Secondary | ICD-10-CM

## 2017-09-09 DIAGNOSIS — Z87891 Personal history of nicotine dependence: Secondary | ICD-10-CM | POA: Diagnosis not present

## 2017-09-09 DIAGNOSIS — O24410 Gestational diabetes mellitus in pregnancy, diet controlled: Secondary | ICD-10-CM

## 2017-09-09 DIAGNOSIS — O0990 Supervision of high risk pregnancy, unspecified, unspecified trimester: Secondary | ICD-10-CM

## 2017-09-09 DIAGNOSIS — O24419 Gestational diabetes mellitus in pregnancy, unspecified control: Secondary | ICD-10-CM | POA: Diagnosis present

## 2017-09-09 LAB — GLUCOSE, CAPILLARY
GLUCOSE-CAPILLARY: 81 mg/dL (ref 65–99)
Glucose-Capillary: 80 mg/dL (ref 65–99)
Glucose-Capillary: 73 mg/dL (ref 65–99)
Glucose-Capillary: 71 mg/dL (ref 65–99)

## 2017-09-09 LAB — CBC
HCT: 30.4 % — ABNORMAL LOW (ref 36.0–46.0)
Hemoglobin: 10.2 g/dL — ABNORMAL LOW (ref 12.0–15.0)
MCH: 27.7 pg (ref 26.0–34.0)
MCHC: 33.6 g/dL (ref 30.0–36.0)
MCV: 82.6 fL (ref 78.0–100.0)
PLATELETS: 216 10*3/uL (ref 150–400)
RBC: 3.68 MIL/uL — AB (ref 3.87–5.11)
RDW: 13.1 % (ref 11.5–15.5)
WBC: 6.7 10*3/uL (ref 4.0–10.5)

## 2017-09-09 LAB — POCT FERN TEST: POCT Fern Test: POSITIVE

## 2017-09-09 LAB — RPR: RPR Ser Ql: NONREACTIVE

## 2017-09-09 LAB — TYPE AND SCREEN
ABO/RH(D): O POS
Antibody Screen: NEGATIVE

## 2017-09-09 MED ORDER — LACTATED RINGERS IV SOLN: 500.0000 mL | INTRAVENOUS | Status: DC | PRN

## 2017-09-09 MED ORDER — DIPHENHYDRAMINE HCL 50 MG/ML IJ SOLN: 12.5000 mg | INTRAMUSCULAR | Status: DC | PRN

## 2017-09-09 MED ORDER — OXYTOCIN BOLUS FROM INFUSION
500.0000 mL | Freq: Once | INTRAVENOUS | Status: AC
  Administered 2017-09-09: 500 mL via INTRAVENOUS

## 2017-09-09 MED ORDER — OXYTOCIN 40 UNITS IN LACTATED RINGERS INFUSION - SIMPLE MED
2.5000 [IU]/h | INTRAVENOUS | Status: DC
  Administered 2017-09-09: 2.5 [IU]/h via INTRAVENOUS

## 2017-09-09 MED ORDER — PHENYLEPHRINE 40 MCG/ML (10ML) SYRINGE FOR IV PUSH (FOR BLOOD PRESSURE SUPPORT)
80.0000 ug | PREFILLED_SYRINGE | INTRAVENOUS | Status: DC | PRN
  Filled 2017-09-09: qty 5

## 2017-09-09 MED ORDER — OXYCODONE-ACETAMINOPHEN 5-325 MG PO TABS: 2.0000 | ORAL_TABLET | ORAL | Status: DC | PRN

## 2017-09-09 MED ORDER — LACTATED RINGERS IV SOLN
500.0000 mL | Freq: Once | INTRAVENOUS | Status: AC
  Administered 2017-09-09: 500 mL via INTRAVENOUS

## 2017-09-09 MED ORDER — SOD CITRATE-CITRIC ACID 500-334 MG/5ML PO SOLN: 30.0000 mL | ORAL | Status: DC | PRN

## 2017-09-09 MED ORDER — PENICILLIN G POT IN DEXTROSE 60000 UNIT/ML IV SOLN
3.0000 10*6.[IU] | INTRAVENOUS | Status: DC
  Administered 2017-09-09 (×3): 3 10*6.[IU] via INTRAVENOUS
  Filled 2017-09-09 (×7): qty 50

## 2017-09-09 MED ORDER — EPHEDRINE 5 MG/ML INJ
10.0000 mg | INTRAVENOUS | Status: DC | PRN
  Filled 2017-09-09: qty 2

## 2017-09-09 MED ORDER — FENTANYL 2.5 MCG/ML BUPIVACAINE 1/10 % EPIDURAL INFUSION (WH - ANES)
14.0000 mL/h | INTRAMUSCULAR | Status: DC | PRN
Start: 2017-09-09 — End: 2017-09-10
  Administered 2017-09-09: 14 mL/h via EPIDURAL
  Filled 2017-09-09: qty 100

## 2017-09-09 MED ORDER — OXYCODONE-ACETAMINOPHEN 5-325 MG PO TABS: 1.0000 | ORAL_TABLET | ORAL | Status: DC | PRN

## 2017-09-09 MED ORDER — LACTATED RINGERS IV SOLN
INTRAVENOUS | Status: DC
  Administered 2017-09-09 (×3): via INTRAVENOUS

## 2017-09-09 MED ORDER — PHENYLEPHRINE 40 MCG/ML (10ML) SYRINGE FOR IV PUSH (FOR BLOOD PRESSURE SUPPORT)
80.0000 ug | PREFILLED_SYRINGE | INTRAVENOUS | Status: DC | PRN
  Filled 2017-09-09: qty 10
  Filled 2017-09-09: qty 5

## 2017-09-09 MED ORDER — LIDOCAINE HCL (PF) 1 % IJ SOLN
INTRAMUSCULAR | Status: DC | PRN
  Administered 2017-09-09: 7 mL via EPIDURAL
  Administered 2017-09-09: 5 mL via EPIDURAL

## 2017-09-09 MED ORDER — OXYTOCIN 40 UNITS IN LACTATED RINGERS INFUSION - SIMPLE MED
1.0000 m[IU]/min | INTRAVENOUS | Status: DC
  Administered 2017-09-09: 2 m[IU]/min via INTRAVENOUS
  Filled 2017-09-09: qty 1000

## 2017-09-09 MED ORDER — ONDANSETRON HCL 4 MG/2ML IJ SOLN
4.0000 mg | Freq: Four times a day (QID) | INTRAMUSCULAR | Status: DC | PRN
  Administered 2017-09-10: 4 mg via INTRAVENOUS
  Filled 2017-09-09: qty 2

## 2017-09-09 MED ORDER — TERBUTALINE SULFATE 1 MG/ML IJ SOLN
0.2500 mg | Freq: Once | INTRAMUSCULAR | Status: DC | PRN
  Filled 2017-09-09: qty 1

## 2017-09-09 MED ORDER — LIDOCAINE HCL (PF) 1 % IJ SOLN
30.0000 mL | INTRAMUSCULAR | Status: DC | PRN
  Administered 2017-09-09: 30 mL via SUBCUTANEOUS
  Filled 2017-09-09: qty 30

## 2017-09-09 MED ORDER — PENICILLIN G POTASSIUM 5000000 UNITS IJ SOLR
5.0000 10*6.[IU] | Freq: Once | INTRAVENOUS | Status: AC
  Administered 2017-09-09: 5 10*6.[IU] via INTRAVENOUS
  Filled 2017-09-09: qty 5

## 2017-09-09 MED ORDER — FLEET ENEMA 7-19 GM/118ML RE ENEM: 1.0000 | ENEMA | RECTAL | Status: DC | PRN

## 2017-09-09 MED ORDER — ACETAMINOPHEN 325 MG PO TABS
650.0000 mg | ORAL_TABLET | ORAL | Status: DC | PRN
  Administered 2017-09-09: 650 mg via ORAL
  Filled 2017-09-09: qty 2

## 2017-09-09 NOTE — Anesthesia Pain Management Evaluation Note (Signed)
  CRNA Pain Management Visit Note  Patient: Katrina Blackwell, 31 y.o., female  "Hello I am a member of the anesthesia team at [facility]. We have an anesthesia team available at all times to provide care throughout the hospital, including epidural management and anesthesia for C-section. I don't know your plan for the delivery whether it a natural birth, water birth, IV sedation, nitrous supplementation, doula or epidural, but we want to meet your pain goals."   1.Was your pain managed to your expectations on prior hospitalizations?   Yes   2.What is your expectation for pain management during this hospitalization?     Epidural  3.How can we help you reach that goal? Epidural when patient reaches pain goal. The patient doesn't want to get too exhausted to push this time.  Record the patient's initial score and the patient's pain goal.   Pain: 4  Pain Goal: 7 [facility] wants you to be able to say your pain was always managed very well.  Katrina Blackwell 09/09/2017

## 2017-09-09 NOTE — Anesthesia Preprocedure Evaluation (Signed)
Anesthesia Evaluation  Patient identified by MRN, date of birth, ID band Patient awake    Reviewed: Allergy & Precautions, H&P , NPO status , Patient's Chart, lab work & pertinent test results  Airway Mallampati: I  TM Distance: >3 FB Neck ROM: full    Dental no notable dental hx. (+) Teeth Intact   Pulmonary neg pulmonary ROS, former smoker,    Pulmonary exam normal breath sounds clear to auscultation       Cardiovascular negative cardio ROS   Rhythm:regular Rate:Normal     Neuro/Psych    GI/Hepatic negative GI ROS, Neg liver ROS,   Endo/Other  diabetes, Gestational, Oral Hypoglycemic Agents  Renal/GU negative Renal ROS     Musculoskeletal   Abdominal Normal abdominal exam  (+)   Peds  Hematology  (+) anemia ,   Anesthesia Other Findings   Reproductive/Obstetrics (+) Pregnancy                             Anesthesia Physical Anesthesia Plan  ASA: II  Anesthesia Plan: Epidural   Post-op Pain Management:    Induction:   PONV Risk Score and Plan:   Airway Management Planned:   Additional Equipment:   Intra-op Plan:   Post-operative Plan:   Informed Consent: I have reviewed the patients History and Physical, chart, labs and discussed the procedure including the risks, benefits and alternatives for the proposed anesthesia with the patient or authorized representative who has indicated his/her understanding and acceptance.     Plan Discussed with:   Anesthesia Plan Comments:         Anesthesia Quick Evaluation

## 2017-09-09 NOTE — Progress Notes (Signed)
Katrina Blackwell is a 31 y.o. G2P1001 at 8367w2d by admitted due to PROM.  Subjective: Ms Katrina Blackwell is doing well resting comfortably in bed. She has her epidural in place and states she can still feel some contractions but the pain is less.  Objective: BP 111/81   Pulse 96   Temp 98.6 F (37 C)   Resp 17   Ht 5\' 6"  (1.676 m)   Wt 152 lb (68.9 kg)   LMP 12/22/2016 (Exact Date)   SpO2 100%   BMI 24.53 kg/m  No intake/output data recorded. No intake/output data recorded.  FHT:  FHR: 140 bpm, variability: minimal ,  accelerations:  Present,  decelerations:  Absent UC:   irregular, every 3-6 minutes SVE:   Dilation: 5 Effacement (%): 70 Station: -2 Exam by:: m wilkins rnc  Labs: Lab Results  Component Value Date   WBC 6.7 09/09/2017   HGB 10.2 (L) 09/09/2017   HCT 30.4 (L) 09/09/2017   MCV 82.6 09/09/2017   PLT 216 09/09/2017    Assessment / Plan: Augmentation of labor, progressing well  Labor: Progressing normally and on Pitocin Preeclampsia:  n/a Fetal Wellbeing:  Category I Pain Control:  Epidural I/D:  n/a Anticipated MOD:  NSVD  [name] 09/09/2017, 5:40 PM

## 2017-09-09 NOTE — H&P (Signed)
Obstetric History and Physical  Mattisen Pohlmann is a 31 y.o. G2P1001 with IUP at 7w2dpresenting for SROM around 0630. Patient states she has been having  regular, every 10-15 minutes contractions, none vaginal bleeding, intact, ruptured membranes with clear fluid, with active fetal movement.    Prenatal Course Source of Care: [place] with onset of care at 11 weeks Dating: By LMP --->  Estimated Date of Delivery: 135/70/17Pregnancy complications or risks: [id number] glyburide 2.5 mg daily  Patient Active Problem List   Diagnosis Date Noted  . Indication for care in labor or delivery 09/09/2017  . Impaired glucose tolerance test 08/12/2017  . GDM (gestational diabetes mellitus) 07/18/2017  . Supervision of high risk pregnancy, antepartum 03/08/2017  . Language barrier 05/12/2013  . GBS (group B Streptococcus carrier), +RV culture, currently pregnant 05/11/2013  . Generalized anxiety disorder 05/11/2013  . Migraines 05/11/2013   She plans to breastfeed She desires undecided for postpartum contraception.   Sono:   '@[redacted]w[redacted]d' , CWD, normal anatomy, cephalic presentation, anterior placenta, 3309g, 85% EFW, AC 96%, LGA  Prenatal labs and studies: ABO, Rh: O/Positive/-- (04/23 1017) Antibody: Negative (04/23 1017) Rubella: 4.52 (04/23 1017) RPR: Non Reactive (08/29 1100)  HBsAg: Negative (04/23 1017)  HIV:   Non-Reactive GBS: Positive in urine 2 hr Glucola  abnormal Genetic screening normal Anatomy UKoreanormal   Prenatal Transfer Tool  Maternal Diabetes: Yes:  Diabetes Type:  Insulin/Medication controlled Genetic Screening: Normal Maternal Ultrasounds/Referrals: Normal Fetal Ultrasounds or other Referrals:  None Maternal Substance Abuse:  No Significant Maternal Medications:  Meds include: Other: glyburide Significant Maternal Lab Results: Lab values include: Group B Strep positive  Past Medical History:  Diagnosis Date  . Anxiety   . Headache(784.0)    MIGRAINES;OTC    Past Surgical  History:  Procedure Laterality Date  . TONSILLECTOMY  2009    OB History  Gravida Para Term Preterm AB Living  '2 1 1     1  ' SAB TAB Ectopic Multiple Live Births          1    # Outcome Date GA Lbr Len/2nd Weight Sex Delivery Anes PTL Lv  2 Current           1 Term 05/12/13 [id number]:39 / 07:41 2.807 kg (6 lb 3 oz) M Vag-Spont None  LIV     Birth Comments: No problems at birth      Social History   Social History  . Marital status: Married    Spouse name: [name]  . Number of children: N/A  . Years of education: 1618 Occupational History  . HOMEMAKER-STUDENT    Social History Main Topics  . Smoking status: Former Smoker    Packs/day: 0.05    Types: Cigarettes  . Smokeless tobacco: Never Used  . Alcohol use No  . Drug use: No  . Sexual activity: Yes    Partners: Male    Birth control/ protection: None     Comment: currently pregnant   Other Topics Concern  . None   Social History Narrative  . None    Family History  Problem Relation Age of Onset  . Hyperlipidemia Mother   . Diabetes Father   . Heart disease Father   . Other Neg Hx     Prescriptions Prior to Admission  Medication Sig Dispense Refill Last Dose  . acetaminophen (TYLENOL) 500 MG tablet Take 500 mg by mouth every 6 (six) hours as needed for moderate pain  or headache.   Not Taking  . Blood Glucose Monitoring Suppl (ACCU-CHEK GUIDE) w/Device KIT USE AS DIRECTED FOUR TIMES DAILY. CHECK IN MORNING FASTING AND 2 HOURS AFTER EACH MEAL 1 kit 0 Taking  . cyclobenzaprine (FLEXERIL) 10 MG tablet Take 1 tablet (10 mg total) by mouth 2 (two) times daily as needed for muscle spasms. (Patient not taking: Reported on 09/05/2017) 10 tablet 0 Not Taking  . Doxylamine-Pyridoxine (DICLEGIS) 10-10 MG TBEC Take 1 tablet with breakfast and lunch.  Take 2 tablets at bedtime. (Patient not taking: Reported on 09/05/2017) 100 tablet 4 Not Taking  . glucose blood (ACCU-CHEK GUIDE) test strip Use as instructed 100 each 12  Taking  . glyBURIDE (DIABETA) 2.5 MG tablet Take 1 tablet (2.5 mg total) by mouth daily with breakfast. 30 tablet 3 Taking  . Lancets (ACCU-CHEK MULTICLIX) lancets Test blood glucose 4 times daily. Use as instructed 100 each 5 Taking  . Prenat-FeAsp-Meth-FA-DHA w/o A (PRENATE PIXIE) 10-0.6-0.4-200 MG CAPS Take 1 tablet by mouth daily. 30 capsule 12 Taking  . Prenatal Vit-Fe Fumarate-FA (PRENATAL MULTIVITAMIN) TABS tablet Take 1 tablet by mouth daily at 12 noon.   Not Taking  . promethazine (PHENERGAN) 25 MG tablet Take 1 tablet (25 mg total) by mouth every 6 (six) hours as needed for nausea or vomiting. (Patient not taking: Reported on 08/27/2017) 30 tablet 0 Not Taking    No Known Allergies  Review of Systems: Negative except for what is mentioned in HPI.  Physical Exam: BP 104/71   Pulse (!) 120   Temp 98.6 F (37 C)   Resp 17   Ht '5\' 6"'  (1.676 m)   Wt 68.9 kg (152 lb)   LMP 12/22/2016 (Exact Date)   SpO2 100%   BMI 24.53 kg/m  CONSTITUTIONAL: Well-developed, well-nourished female in no acute distress.  HENT:  Normocephalic, atraumatic, External right and left ear normal. Oropharynx is clear and moist EYES: Conjunctivae and EOM are normal. Pupils are equal, round, and reactive to light. No scleral icterus.  NECK: Normal range of motion, supple, no masses SKIN: Skin is warm and dry. No rash noted. Not diaphoretic. No erythema. No pallor. NEUROLOGIC: Alert and oriented to person, place, and time. Normal reflexes, muscle tone coordination. No cranial nerve deficit noted. PSYCHIATRIC: Normal mood and affect. Normal behavior. Normal judgment and thought content. CARDIOVASCULAR: Normal heart rate noted, regular rhythm RESPIRATORY: Effort and breath sounds normal, no problems with respiration noted ABDOMEN: Soft, nontender, nondistended, gravid. MUSCULOSKELETAL: Normal range of motion. No edema and no tenderness. 2+ distal pulses.  Dilation: 1 Effacement (%): 50 Cervical Position:  Posterior Presentation: Vertex Exam by:: Elton Sin RN Presentation: cephalic FHT:  Baseline rate 135 bpm   Variability moderate  Accelerations present   Decelerations none Contractions: occasional   Pertinent Labs/Studies:   Results for orders placed or performed during the hospital encounter of 09/09/17 (from the past 24 hour(s))  POCT fern test     Status: None   Collection Time: 09/09/17  8:00 AM  Result Value Ref Range   POCT Fern Test Positive = ruptured amniotic membanes   CBC     Status: Abnormal   Collection Time: 09/09/17  8:20 AM  Result Value Ref Range   WBC 6.7 4.0 - 10.5 K/uL   RBC 3.68 (L) 3.87 - 5.11 MIL/uL   Hemoglobin 10.2 (L) 12.0 - 15.0 g/dL   HCT 30.4 (L) 36.0 - 46.0 %   MCV 82.6 78.0 - 100.0 fL   MCH  27.7 26.0 - 34.0 pg   MCHC 33.6 30.0 - 36.0 g/dL   RDW 13.1 11.5 - 15.5 %   Platelets 216 150 - 400 K/uL  Glucose, capillary     Status: None   Collection Time: 09/09/17  9:13 AM  Result Value Ref Range   Glucose-Capillary 80 65 - 99 mg/dL    Assessment : Shenise Wolgamott is a 31 y.o. G2P1001 at 51w2dbeing admitted for PROM without labor. Will start induction of labor.  Plan: Labor: Induction/Augmentation as ordered as per protocol. Analgesia as needed. FWB: Reassuring fetal heart tracing.  GBS positive GDM: Fasting CBG normal; continue to monitor q4hrs.  Delivery plan: Hopeful for vaginal delivery   JLuiz Blare DO OB Fellow Faculty Practice, [place]10/22/2018, 9:34 AM

## 2017-09-09 NOTE — Anesthesia Procedure Notes (Signed)
Epidural Patient location during procedure: OB Start time: 09/09/2017 4:38 PM End time: 09/09/2017 4:41 PM  Staffing Anesthesiologist: Leilani AbleHATCHETT, Eyanna Mcgonagle Performed: anesthesiologist   Preanesthetic Checklist Completed: patient identified, surgical consent, pre-op evaluation, timeout performed, IV checked, risks and benefits discussed and monitors and equipment checked  Epidural Patient position: sitting Prep: site prepped and draped and DuraPrep Patient monitoring: continuous pulse ox and blood pressure Approach: midline Location: L3-L4 Injection technique: LOR air  Needle:  Needle type: Tuohy  Needle gauge: 17 G Needle length: 9 cm and 9 Needle insertion depth: 4 cm Catheter type: closed end flexible Catheter size: 19 Gauge Catheter at skin depth: 9 cm Test dose: negative and Other  Assessment Sensory level: T9 Events: blood not aspirated, injection not painful, no injection resistance, negative IV test and no paresthesia  Additional Notes Reason for block:procedure for pain

## 2017-09-09 NOTE — MAU Note (Signed)
+  contractions Rating pain 4/10  +LOF , clear 6:45 this am   +FM  Denies vaginal bleeding

## 2017-09-10 ENCOUNTER — Encounter: Payer: BLUE CROSS/BLUE SHIELD | Admitting: Obstetrics and Gynecology

## 2017-09-10 ENCOUNTER — Encounter (HOSPITAL_COMMUNITY): Payer: Self-pay

## 2017-09-10 LAB — GLUCOSE, CAPILLARY
GLUCOSE-CAPILLARY: 145 mg/dL — AB (ref 65–99)
Glucose-Capillary: 92 mg/dL (ref 65–99)

## 2017-09-10 LAB — CBC
HEMATOCRIT: 24 % — AB (ref 36.0–46.0)
Hemoglobin: 8.1 g/dL — ABNORMAL LOW (ref 12.0–15.0)
MCH: 28.2 pg (ref 26.0–34.0)
MCHC: 33.8 g/dL (ref 30.0–36.0)
MCV: 83.6 fL (ref 78.0–100.0)
PLATELETS: 186 10*3/uL (ref 150–400)
RBC: 2.87 MIL/uL — ABNORMAL LOW (ref 3.87–5.11)
RDW: 13.3 % (ref 11.5–15.5)
WBC: 11.8 10*3/uL — ABNORMAL HIGH (ref 4.0–10.5)

## 2017-09-10 MED ORDER — ONDANSETRON HCL 4 MG/2ML IJ SOLN: 4.0000 mg | INTRAMUSCULAR | Status: DC | PRN

## 2017-09-10 MED ORDER — SIMETHICONE 80 MG PO CHEW
80.0000 mg | CHEWABLE_TABLET | ORAL | Status: DC | PRN
  Administered 2017-09-10: 80 mg via ORAL
  Filled 2017-09-10: qty 1

## 2017-09-10 MED ORDER — WITCH HAZEL-GLYCERIN EX PADS: 1.0000 "application " | MEDICATED_PAD | CUTANEOUS | Status: DC | PRN

## 2017-09-10 MED ORDER — IBUPROFEN 600 MG PO TABS
600.0000 mg | ORAL_TABLET | Freq: Four times a day (QID) | ORAL | Status: DC
  Administered 2017-09-10 – 2017-09-11 (×6): 600 mg via ORAL
  Filled 2017-09-10 (×6): qty 1

## 2017-09-10 MED ORDER — DIBUCAINE 1 % RE OINT: 1.0000 "application " | TOPICAL_OINTMENT | RECTAL | Status: DC | PRN

## 2017-09-10 MED ORDER — ZOLPIDEM TARTRATE 5 MG PO TABS: 5.0000 mg | ORAL_TABLET | Freq: Every evening | ORAL | Status: DC | PRN

## 2017-09-10 MED ORDER — ONDANSETRON HCL 4 MG PO TABS: 4.0000 mg | ORAL_TABLET | ORAL | Status: DC | PRN

## 2017-09-10 MED ORDER — LACTATED RINGERS IV BOLUS (SEPSIS): 500.0000 mL | Freq: Once | INTRAVENOUS | Status: DC

## 2017-09-10 MED ORDER — FERROUS SULFATE 325 (65 FE) MG PO TABS
325.0000 mg | ORAL_TABLET | Freq: Two times a day (BID) | ORAL | Status: DC
  Administered 2017-09-10 – 2017-09-11 (×2): 325 mg via ORAL
  Filled 2017-09-10 (×2): qty 1

## 2017-09-10 MED ORDER — ACETAMINOPHEN 325 MG PO TABS
650.0000 mg | ORAL_TABLET | ORAL | Status: DC | PRN
  Administered 2017-09-10 (×4): 650 mg via ORAL
  Filled 2017-09-10 (×4): qty 2

## 2017-09-10 MED ORDER — COCONUT OIL OIL: 1.0000 "application " | TOPICAL_OIL | Status: DC | PRN

## 2017-09-10 MED ORDER — TETANUS-DIPHTH-ACELL PERTUSSIS 5-2.5-18.5 LF-MCG/0.5 IM SUSP: 0.5000 mL | Freq: Once | INTRAMUSCULAR | Status: DC

## 2017-09-10 MED ORDER — DIPHENHYDRAMINE HCL 25 MG PO CAPS: 25.0000 mg | ORAL_CAPSULE | Freq: Four times a day (QID) | ORAL | Status: DC | PRN

## 2017-09-10 MED ORDER — BENZOCAINE-MENTHOL 20-0.5 % EX AERO: 1.0000 "application " | INHALATION_SPRAY | CUTANEOUS | Status: DC | PRN

## 2017-09-10 MED ORDER — SENNOSIDES-DOCUSATE SODIUM 8.6-50 MG PO TABS
2.0000 | ORAL_TABLET | ORAL | Status: DC
  Administered 2017-09-10 – 2017-09-11 (×2): 2 via ORAL
  Filled 2017-09-10 (×2): qty 2

## 2017-09-10 MED ORDER — PRENATAL MULTIVITAMIN CH
1.0000 | ORAL_TABLET | Freq: Every day | ORAL | Status: DC
  Administered 2017-09-10 – 2017-09-11 (×2): 1 via ORAL
  Filled 2017-09-10 (×2): qty 1

## 2017-09-10 MED ORDER — LACTATED RINGERS IV BOLUS (SEPSIS)
500.0000 mL | Freq: Once | INTRAVENOUS | Status: AC
  Administered 2017-09-10: 500 mL via INTRAVENOUS

## 2017-09-10 NOTE — Progress Notes (Signed)
MOB was referred for history of depression/anxiety. * Referral screened out by Clinical Social Worker because none of the following criteria appear to apply: ~ History of anxiety/depression during this pregnancy, or of post-partum depression. ~ Diagnosis of anxiety and/or depression within last 3 years (2013) OR * MOB's symptoms currently being treated with medication and/or therapy. Please contact the Clinical Social Worker if needs arise, by MOB request, or if MOB scores greater than 9/yes to question 10 on Edinburgh Postpartum Depression Screen.  Of note, MOB denied hx of anxiety to CSW in 2014. 

## 2017-09-10 NOTE — Anesthesia Postprocedure Evaluation (Signed)
Anesthesia Post Note  Patient: Katrina Blackwell  Procedure(s) Performed: AN AD HOC LABOR EPIDURAL     Patient location during evaluation: Mother Baby Anesthesia Type: Epidural Level of consciousness: awake and alert and oriented Pain management: satisfactory to patient Vital Signs Assessment: post-procedure vital signs reviewed and stable Respiratory status: respiratory function stable Cardiovascular status: stable Postop Assessment: no headache, no backache, epidural receding, patient able to bend at knees, no signs of nausea or vomiting and adequate PO intake Anesthetic complications: no    Last Vitals:  Vitals:   09/10/17 0250 09/10/17 0640  BP: 102/72 (!) 90/52  Pulse: 98 76  Resp: 18 18  Temp: 36.7 C 36.7 C  SpO2: 100% 100%    Last Pain:  Vitals:   09/10/17 0640  TempSrc: Oral  PainSc:    Pain Goal: Patients Stated Pain Goal: 3 (09/10/17 0504)               Karleen DolphinFUSSELL,Lyndy Russman

## 2017-09-10 NOTE — Progress Notes (Signed)
Post Partum Day 1 Subjective: up ad lib, voiding, + flatus and some nausea with eating that was relieved with Zofran, and dizziness when standing relieved with sitting.  Objective: Blood pressure (!) 90/52, pulse 76, temperature 98 F (36.7 C), temperature source Oral, resp. rate 18, height 5\' 6"  (1.676 m), weight 68.9 kg (152 lb), last menstrual period 12/22/2016, SpO2 100 %, unknown if currently breastfeeding.  Physical Exam:  General: alert Lochia: appropriate Uterine Fundus: firm Incision: N/A DVT Evaluation: No evidence of DVT seen on physical exam.   Recent Labs  09/09/17 0820  HGB 10.2*  HCT 30.4*   CBG (last 3)   Recent Labs  09/09/17 2117 09/10/17 0108 09/10/17 0737  GLUCAP 71 145* 92    Assessment/Plan: Plan for discharge tomorrow, Breastfeeding and Contraception method will be IUD   LOS: 1 day   Julieanne MansonRebecca Haug, PA-S 09/10/2017, 7:45 AM   OB FELLOW MEDICAL STUDENT NOTE ATTESTATION  I confirm that I have verified the information documented in the medical student's note and that I have also personally performed the physical exam and all medical decision making activities.  Pt doing well. Plan to d/c home tomorrow.  Frederik PearJulie P Alveta Quintela, MD OB Fellow

## 2017-09-10 NOTE — Lactation Note (Signed)
This note was copied from a baby's chart. Lactation Consultation Note  Patient Name: Boy Harlen LabsRanda Stoklosa ZOXWR'UToday's Date: 09/10/2017 Reason for consult: Initial assessment   Baby 21 hours old.  P2  Breastfed son for 18 months.  Visitors in room. Mother states she recently breastfed baby for 12 min. Discussed waking techniques.  Encouraged longer feeds on both breasts. Suggest hand expressing and spoon feeding in addition to breastfeeding. Mom encouraged to feed baby 8-12 times/24 hours and with feeding cues at least q 3.  Mom made aware of O/P services, breastfeeding support groups, community resources, and our phone # for post-discharge questions.       Maternal Data Has patient been taught Hand Expression?: Yes Does the patient have breastfeeding experience prior to this delivery?: Yes  Feeding Feeding Type: Breast Fed Length of feed: 12 min  LATCH Score                   Interventions    Lactation Tools Discussed/Used     Consult Status Consult Status: Follow-up Date: 09/11/17 Follow-up type: In-patient    Dahlia ByesBerkelhammer, [facility] 09/10/2017, 7:40 PM

## 2017-09-11 ENCOUNTER — Other Ambulatory Visit: Payer: BLUE CROSS/BLUE SHIELD

## 2017-09-11 MED ORDER — IBUPROFEN 600 MG PO TABS: 600.0000 mg | ORAL_TABLET | Freq: Four times a day (QID) | ORAL | 1 refills | Status: DC

## 2017-09-11 MED ORDER — SENNOSIDES-DOCUSATE SODIUM 8.6-50 MG PO TABS: 2.0000 | ORAL_TABLET | Freq: Every evening | ORAL | 0 refills | Status: AC | PRN

## 2017-09-11 NOTE — Lactation Note (Signed)
This note was copied from a baby's chart. Lactation Consultation Note  Patient Name: Katrina Blackwell's Date: 09/11/2017 Reason for consult: Initial assessment Baby at 42 hr of life. Dyad set for d/c today. Mom is reporting bilateral nipple soreness, no skin break down or bruising noted. She is using expressed milk and coconut oil after feedings. Mom reports "the milk is not coming yet but it took 1 wk with him (her older child)". She is offering formula but requested a pump to take home. Given Harmony. Discussed baby behavior, feeding frequency, baby belly size, voids, wt loss, breast changes, and nipple care. Parents are aware of lactation services and support group. They will call as needed.    Maternal Data    Feeding Feeding Type: Bottle Fed - Formula Nipple Type: Slow - flow  LATCH Score                   Interventions Interventions: Breast feeding basics reviewed;Skin to skin;Breast massage;Hand pump;Coconut oil  Lactation Tools Discussed/Used Pump Review: Setup, frequency, and cleaning;Milk Storage Initiated by:: ES Date initiated:: 09/11/17   Consult Status Consult Status: Complete    Rulon Eisenmengerlizabeth E Nanda Bittick 09/11/2017, 5:25 PM

## 2017-09-11 NOTE — Discharge Summary (Signed)
OB Discharge Summary     Patient Name: Katrina Blackwell DOB: 08-05-1986 MRN: [id number]  Date of admission: 09/09/2017 Delivering MD: Gailen Shelter   Date of discharge: 09/11/2017  Admitting diagnosis: 37wks, water broke, contractions Intrauterine pregnancy: [redacted]w[redacted]d    Secondary diagnosis:  Principal Problem:   SVD (spontaneous vaginal delivery) Active Problems:   GBS (group B Streptococcus carrier), +RV culture, currently pregnant   GDM (gestational diabetes mellitus)  Additional problems: PROM, GAD, Migraines, high risk pregnancy     Discharge diagnosis: Term Pregnancy Delivered and GDM A2                                                                                                Post partum procedures:none  Augmentation: AROM and Pitocin  Complications: None  Hospital course:  Onset of Labor With Vaginal Delivery     31y.o. yo G2P2002 at 31w2das admitted in Latent Labor on 09/09/2017. Patient had an uncomplicated labor course as follows:  Membrane Rupture Time/Date: 6:45 AM ,09/09/2017   Intrapartum Procedures: Episiotomy: Median [2]                                         Lacerations:  2nd degree [3];Perineal [11]  Patient had a delivery of a Viable infant. 09/09/2017  Information for the patient's newborn:  ElTiyonna, Sardinha0[[id number]]     Pateint had an uncomplicated postpartum course.  She is ambulating, tolerating a regular diet, passing flatus, and urinating well. Patient is discharged home in stable condition on 09/11/17.   Physical exam  Vitals:   09/10/17 0250 09/10/17 0640 09/10/17 1742 09/11/17 0525  BP: 102/72 (!) 90/52 (!) 95/56 (!) 82/43  Pulse: 98 76 94 93  Resp: '18 18 18 18  ' Temp: 98 F (36.7 C) 98 F (36.7 C) 97.8 F (36.6 C) 98.1 F (36.7 C)  TempSrc: Oral Oral Oral Oral  SpO2: 100% 100%    Weight:      Height:       General: alert, cooperative and no distress Lochia: appropriate Uterine Fundus: firm Incision: Patient had  episiotomy which was evaluated by nursing staff DVT Evaluation: No evidence of DVT seen on physical exam. No cords or calf tenderness. Labs: Lab Results  Component Value Date   WBC 11.8 (H) 09/10/2017   HGB 8.1 (L) 09/10/2017   HCT 24.0 (L) 09/10/2017   MCV 83.6 09/10/2017   PLT 186 09/10/2017   CMP Latest Ref Rng & Units 03/05/2017  Glucose 65 - 99 mg/dL 81  BUN 6 - 20 mg/dL 6  Creatinine 0.44 - 1.00 mg/dL 0.42(L)  Sodium 135 - 145 mmol/L 134(L)  Potassium 3.5 - 5.1 mmol/L 3.5  Chloride 101 - 111 mmol/L 100(L)  CO2 22 - 32 mmol/L 25  Calcium 8.9 - 10.3 mg/dL 9.3  Total Protein 6.5 - 8.1 g/dL 7.5  Total Bilirubin 0.3 - 1.2 mg/dL 0.4  Alkaline Phos 38 - 126 U/L 41  AST 15 -  41 U/L 22  ALT 14 - 54 U/L 14    Discharge instruction: per After Visit Summary and "Baby and Me Booklet".  After visit meds:  Allergies as of 09/11/2017   No Known Allergies     Medication List    STOP taking these medications   ACCU-CHEK GUIDE w/Device Kit   accu-chek multiclix lancets   glucose blood test strip Commonly known as:  ACCU-CHEK GUIDE   glyBURIDE 2.5 MG tablet Commonly known as:  DIABETA   promethazine 25 MG tablet Commonly known as:  PHENERGAN     TAKE these medications   acetaminophen 500 MG tablet Commonly known as:  TYLENOL Take 500 mg by mouth every 6 (six) hours as needed for moderate pain or headache.   cyclobenzaprine 10 MG tablet Commonly known as:  FLEXERIL Take 1 tablet (10 mg total) by mouth 2 (two) times daily as needed for muscle spasms.   Doxylamine-Pyridoxine 10-10 MG Tbec Commonly known as:  DICLEGIS Take 1 tablet with breakfast and lunch.  Take 2 tablets at bedtime.   ibuprofen 600 MG tablet Commonly known as:  ADVIL,MOTRIN Take 1 tablet (600 mg total) by mouth every 6 (six) hours.   prenatal multivitamin Tabs tablet Take 1 tablet by mouth daily at 12 noon.   PRENATE PIXIE 10-0.6-0.4-200 MG Caps Take 1 tablet by mouth daily.   senna-docusate  8.6-50 MG tablet Commonly known as:  Senokot-S Take 2 tablets by mouth at bedtime as needed for mild constipation.       Diet: carb modified diet  Activity: Advance as tolerated. Pelvic rest for 6 weeks.   Outpatient follow up:1 week Follow up Appt:Future Appointments Date Time Provider [place]  10/22/2017 8:15 AM [facility] None  10/22/2017 8:45 AM Woodroe Mode, MD [place] None   Follow up Visit:No Follow-up on file.  Postpartum contraception: IUD undecided  Newborn Data: Live born female  Birth Weight: 6 lb 3.5 oz (2821 g) APGAR: 8, 9  Newborn Delivery   Birth date/time:  09/09/2017 22:39:00 Delivery type:  Vaginal, Spontaneous Delivery      Baby Feeding: Breast Disposition:home with mother   09/11/2017 Nuala Alpha, DO  PGY-1, [place]

## 2017-09-11 NOTE — Discharge Instructions (Signed)

## 2017-10-22 ENCOUNTER — Ambulatory Visit (INDEPENDENT_AMBULATORY_CARE_PROVIDER_SITE_OTHER): Payer: BLUE CROSS/BLUE SHIELD | Admitting: Obstetrics & Gynecology

## 2017-10-22 ENCOUNTER — Other Ambulatory Visit: Payer: BLUE CROSS/BLUE SHIELD

## 2017-10-22 ENCOUNTER — Encounter: Payer: Self-pay | Admitting: Obstetrics & Gynecology

## 2017-10-22 VITALS — BP 115/82 | HR 86 | Wt 138.0 lb

## 2017-10-22 DIAGNOSIS — Z1389 Encounter for screening for other disorder: Secondary | ICD-10-CM

## 2017-10-22 DIAGNOSIS — O24410 Gestational diabetes mellitus in pregnancy, diet controlled: Secondary | ICD-10-CM

## 2017-10-22 DIAGNOSIS — O219 Vomiting of pregnancy, unspecified: Secondary | ICD-10-CM

## 2017-10-22 MED ORDER — PRENATE PIXIE 10-0.6-0.4-200 MG PO CAPS: 1.0000 | ORAL_CAPSULE | Freq: Every day | ORAL | 12 refills | Status: DC

## 2017-10-22 NOTE — Progress Notes (Signed)
Post Partum Exam  Harlen LabsRanda Blackwell is a 31 y.o. 692P2002 female who presents for a postpartum visit. She is 6 weeks postpartum following a spontaneous vaginal delivery. I have fully reviewed the prenatal and intrapartum course. The delivery was at 37.2 gestational weeks.  Anesthesia: epidural. Postpartum course has been good. Baby's course has been good. Baby is feeding by both breast and bottle - Carnation Good Start. Bleeding no bleeding. Bowel function is normal. Bladder function is normal. Patient is not sexually active. Contraception method is IUD. Postpartum depression screening:  SCORE 2 She is not sure about LARC The following portions of the patient's history were reviewed and updated as appropriate: allergies, current medications, past family history, past medical history, past social history, past surgical history and problem list.  Review of Systems Pertinent items are noted in HPI.    Objective:  unknown if currently breastfeeding.  General:  alert, cooperative and no distress           Abdomen: soft, non-tender; bowel sounds normal; no masses,  no organomegaly   Vulva:  normal  Vagina: normal vagina  Cervix:     Corpus: not examined  Adnexa:  not evaluated  Rectal Exam: Not performed.        Assessment:    Normal postpartum exam. Pap smear not done at today's visit.   Plan:   1. Contraception: condoms and we discussed POP and Nexplanon and IUD 2. 2 hr GTT today 3. Follow up as needed.   Adam PhenixArnold, Katrina Shanker G, MD 10/22/2017

## 2017-10-22 NOTE — Patient Instructions (Signed)
Postpartum Care After Vaginal Delivery °The period of time right after you deliver your newborn is called the postpartum period. °What kind of medical care will I receive? °· You may continue to receive fluids and medicines through an IV tube inserted into one of your veins. °· If an incision was made near your vagina (episiotomy) or if you had some vaginal tearing during delivery, cold compresses may be placed on your episiotomy or your tear. This helps to reduce pain and swelling. °· You may be given a squirt bottle to use when you go to the bathroom. You may use this until you are comfortable wiping as usual. To use the squirt bottle, follow these steps: °? Before you urinate, fill the squirt bottle with warm water. Do not use hot water. °? After you urinate, while you are sitting on the toilet, use the squirt bottle to rinse the area around your urethra and vaginal opening. This rinses away any urine and blood. °? You may do this instead of wiping. As you start healing, you may use the squirt bottle before wiping yourself. Make sure to wipe gently. °? Fill the squirt bottle with clean water every time you use the bathroom. °· You will be given sanitary pads to wear. °How can I expect to feel? °· You may not feel the need to urinate for several hours after delivery. °· You will have some soreness and pain in your abdomen and vagina. °· If you are breastfeeding, you may have uterine contractions every time you breastfeed for up to several weeks postpartum. Uterine contractions help your uterus return to its normal size. °· It is normal to have vaginal bleeding (lochia) after delivery. The amount and appearance of lochia is often similar to a menstrual period in the first week after delivery. It will gradually decrease over the next few weeks to a dry, yellow-brown discharge. For most women, lochia stops completely by 6-8 weeks after delivery. Vaginal bleeding can vary from woman to woman. °· Within the first few  days after delivery, you may have breast engorgement. This is when your breasts feel heavy, full, and uncomfortable. Your breasts may also throb and feel hard, tightly stretched, warm, and tender. After this occurs, you may have milk leaking from your breasts. Your health care provider can help you relieve discomfort due to breast engorgement. Breast engorgement should go away within a few days. °· You may feel more sad or worried than normal due to hormonal changes after delivery. These feelings should not last more than a few days. If these feelings do not go away after several days, speak with your health care provider. °How should I care for myself? °· Tell your health care provider if you have pain or discomfort. °· Drink enough water to keep your urine clear or pale yellow. °· Wash your hands thoroughly with soap and water for at least 20 seconds after changing your sanitary pads, after using the toilet, and before holding or feeding your baby. °· If you are not breastfeeding, avoid touching your breasts a lot. Doing this can make your breasts produce more milk. °· If you become weak or lightheaded, or you feel like you might faint, ask for help before: °? Getting out of bed. °? Showering. °· Change your sanitary pads frequently. Watch for any changes in your flow, such as a sudden increase in volume, a change in color, the passing of large blood clots. If you pass a blood clot from your vagina, save it   to show to your health care provider. Do not flush blood clots down the toilet without having your health care provider look at them. °· Make sure that all your vaccinations are up to date. This can help protect you and your baby from getting certain diseases. You may need to have immunizations done before you leave the hospital. °· If desired, talk with your health care provider about methods of family planning or birth control (contraception). °How can I start bonding with my baby? °Spending as much time as  possible with your baby is very important. During this time, you and your baby can get to know each other and develop a bond. Having your baby stay with you in your room (rooming in) can give you time to get to know your baby. Rooming in can also help you become comfortable caring for your baby. Breastfeeding can also help you bond with your baby. °How can I plan for returning home with my baby? °· Make sure that you have a car seat installed in your vehicle. °? Your car seat should be checked by a certified car seat installer to make sure that it is installed safely. °? Make sure that your baby fits into the car seat safely. °· Ask your health care provider any questions you have about caring for yourself or your baby. Make sure that you are able to contact your health care provider with any questions after leaving the hospital. °This information is not intended to replace advice given to you by your health care provider. Make sure you discuss any questions you have with your health care provider. °Document Released: 09/02/2007 Document Revised: 04/09/2016 Document Reviewed: 10/10/2015 °Elsevier Interactive Patient Education © 2018 [employer] Inc[employer] ° °

## 2017-10-29 LAB — GLUCOSE TOLERANCE, 2 HOURS
Glucose, 2 hour: 155 mg/dL — ABNORMAL HIGH (ref 65–139)
Glucose, GTT - Fasting: 85 mg/dL (ref 65–99)

## 2017-10-31 ENCOUNTER — Ambulatory Visit: Payer: BLUE CROSS/BLUE SHIELD | Admitting: Obstetrics & Gynecology

## 2017-11-13 ENCOUNTER — Encounter: Payer: Self-pay | Admitting: Pediatrics

## 2017-11-14 ENCOUNTER — Encounter: Payer: Self-pay | Admitting: Pediatrics

## 2017-11-14 ENCOUNTER — Ambulatory Visit: Payer: BLUE CROSS/BLUE SHIELD | Admitting: Obstetrics & Gynecology

## 2017-12-31 ENCOUNTER — Ambulatory Visit (INDEPENDENT_AMBULATORY_CARE_PROVIDER_SITE_OTHER): Payer: BLUE CROSS/BLUE SHIELD | Admitting: Obstetrics and Gynecology

## 2017-12-31 ENCOUNTER — Encounter: Payer: Self-pay | Admitting: Obstetrics and Gynecology

## 2017-12-31 VITALS — BP 120/76 | HR 92

## 2017-12-31 DIAGNOSIS — R7309 Other abnormal glucose: Secondary | ICD-10-CM

## 2017-12-31 DIAGNOSIS — Z3202 Encounter for pregnancy test, result negative: Secondary | ICD-10-CM

## 2017-12-31 DIAGNOSIS — Z30430 Encounter for insertion of intrauterine contraceptive device: Secondary | ICD-10-CM

## 2017-12-31 HISTORY — DX: Other abnormal glucose: R73.09

## 2017-12-31 LAB — POCT URINE PREGNANCY: PREG TEST UR: NEGATIVE

## 2017-12-31 MED ORDER — KETOROLAC TROMETHAMINE 60 MG/2ML IM SOLN
30.0000 mg | Freq: Once | INTRAMUSCULAR | Status: AC
  Administered 2017-12-31: 30 mg via INTRAMUSCULAR

## 2017-12-31 MED ORDER — KETOROLAC TROMETHAMINE 30 MG/ML IJ SOLN: 30.0000 mg | Freq: Once | INTRAMUSCULAR | Status: DC

## 2017-12-31 MED ORDER — LEVONORGESTREL 20 MCG/24HR IU IUD
INTRAUTERINE_SYSTEM | Freq: Once | INTRAUTERINE | Status: AC
  Administered 2017-12-31: 16:00:00 via INTRAUTERINE

## 2017-12-31 NOTE — Addendum Note (Signed)
Addended by: Marya LandryFOSTER, Jovian Lembcke D on: 12/31/2017 04:10 PM   Modules accepted: Orders

## 2017-12-31 NOTE — Patient Instructions (Signed)

## 2017-12-31 NOTE — Addendum Note (Signed)
Addended by: Marya LandryFOSTER, SUZANNE D on: 12/31/2017 04:00 PM   Modules accepted: Orders

## 2017-12-31 NOTE — Progress Notes (Signed)
GYNECOLOGY CLINIC PROCEDURE NOTE  Harlen LabsRanda Blackwell is a 32 y.o. [id number] here for Mirena IUD insertion. No GYN concerns. Pt is breast feeding.  Last pap smear was on 02/2017 and was normal.  IUD Insertion  Patient identified, informed consent performed, consent signed.   Discussed risks of irregular bleeding, cramping, infection, malpositioning or misplacement of the IUD outside the uterus which may require further procedures. Also advised to use backup contraception for one week. Time out was performed. Speculum placed in the vagina. The strings of the IUD were grasped and pulled using ring forceps. The IUD was successfully removed in its entirety. The cervix was cleaned with Betadine x 2 and grasped anteriorly with a single tooth tenaculum.  The new Mirena IUD insertion apparatus was used to sound the uterus to 8 cm;  the IUD was then placed per manufacturer's recommendations. Strings trimmed to 3 cm. Tenaculum was removed, good hemostasis noted. Patient tolerated procedure well.   Patient was given post-procedure instructions.  She was reminded to have backup contraception for one week during this transition period between IUDs.  Patient was also asked to check IUD strings periodically and follow up in 4 weeks for IUD check.   Pt has appt with PCP next week to discuss abnormal glucose test.   Nettie ElmMichael Dysen Edmondson Attending Obstetrician & Gynecologist Center for [facility], [facility]

## 2018-01-28 ENCOUNTER — Ambulatory Visit (INDEPENDENT_AMBULATORY_CARE_PROVIDER_SITE_OTHER): Payer: BLUE CROSS/BLUE SHIELD | Admitting: Obstetrics & Gynecology

## 2018-01-28 ENCOUNTER — Encounter: Payer: Self-pay | Admitting: Obstetrics & Gynecology

## 2018-01-28 VITALS — BP 118/79 | HR 94 | Wt 135.1 lb

## 2018-01-28 DIAGNOSIS — Z30431 Encounter for routine checking of intrauterine contraceptive device: Secondary | ICD-10-CM

## 2018-01-28 NOTE — Progress Notes (Signed)
Subjective:     Patient ID: Katrina Blackwell, female   DOB: 03-Apr-1986, 32 y.o.   MRN: [id number] Cc: IUD checkup HPI G2P2002 Patient's last menstrual period was 01/24/2018. Mirena was placed 2/12 and she returns for string check. There has been occasional spotting and cramps but mild.  No Known Allergies Past Medical History:  Diagnosis Date  . Anxiety   . Headache(784.0)    MIGRAINES;OTC   Past Surgical History:  Procedure Laterality Date  . TONSILLECTOMY  2009     Review of Systems  Constitutional: Negative.   Gastrointestinal: Negative.   Genitourinary: Positive for vaginal bleeding. Negative for pelvic pain and vaginal discharge.       Objective:   Physical Exam  Constitutional: She appears well-developed. No distress.  Genitourinary: Vagina normal. No vaginal discharge found.  Genitourinary Comments: String 2-3 cm cervix is normal  Psychiatric: She has a normal mood and affect. Her behavior is normal.  Vitals reviewed.      Assessment:     Doing well after IUD insertion    Plan:     F/u for routine gyn health maintenance   Adam PhenixArnold, Bhavesh Vazquez G, MD 01/28/2018

## 2018-01-28 NOTE — Patient Instructions (Signed)

## 2018-02-09 IMAGING — US US FETAL BPP W/ NON-STRESS
1 series · 10 of 10 positions shown · non-contrast
Comparison: none

[Series 1: us fetal bpp w/nonstress · 10 acquisitions, 10 frames shown]
[im 1/10]
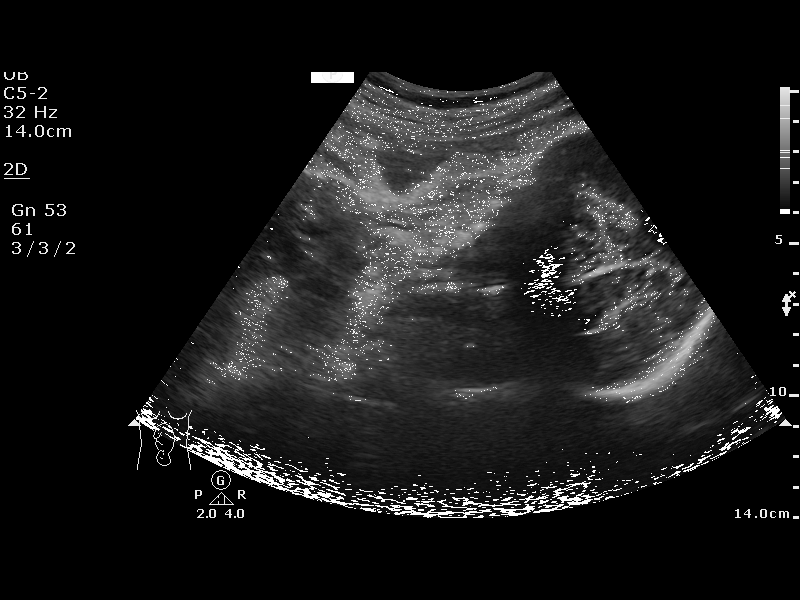
[im 2/10]
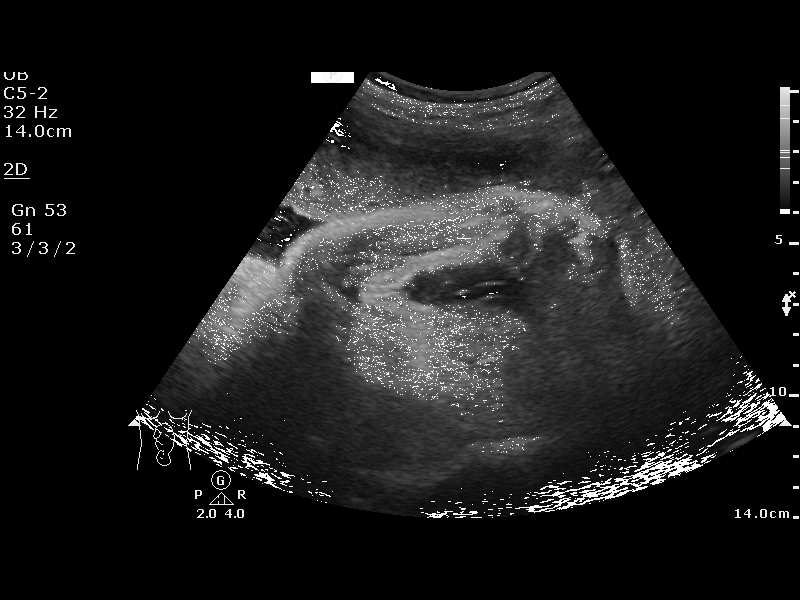
[im 3/10]
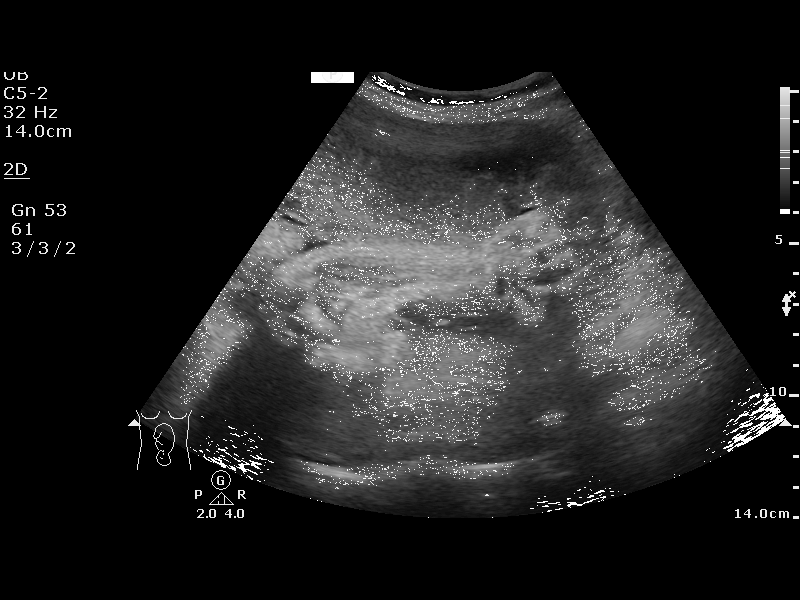
[im 4/10]
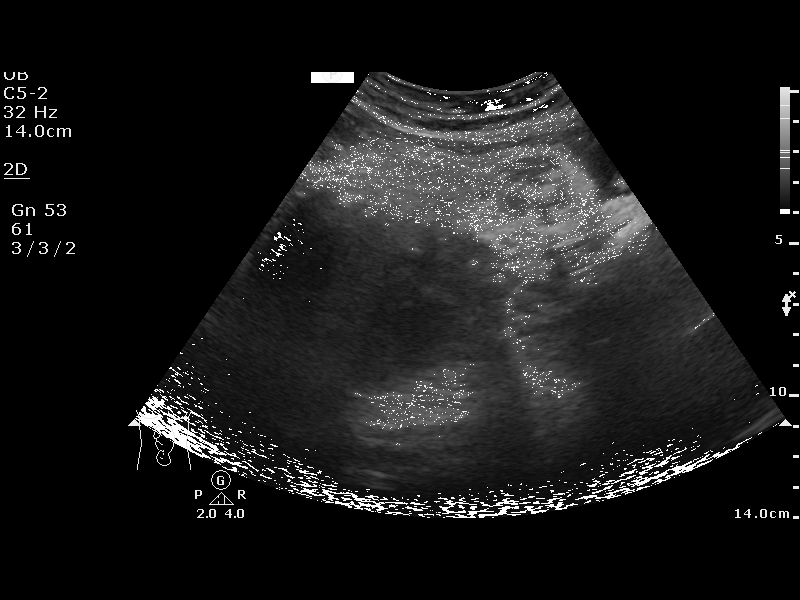
[im 5/10]
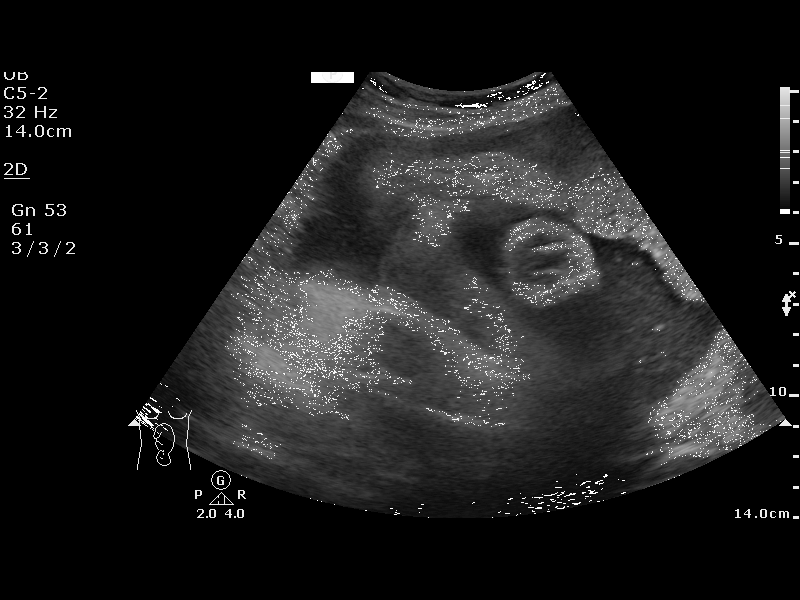
[im 6/10]
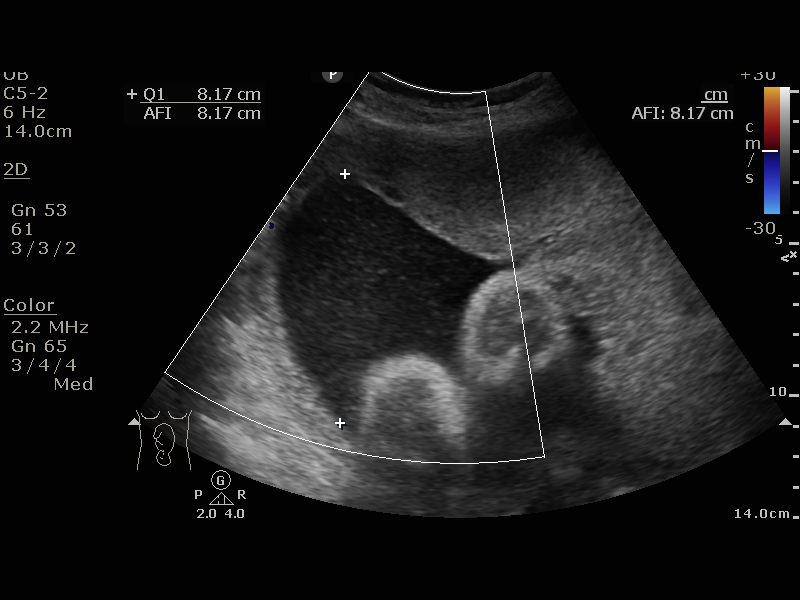
[im 7/10]
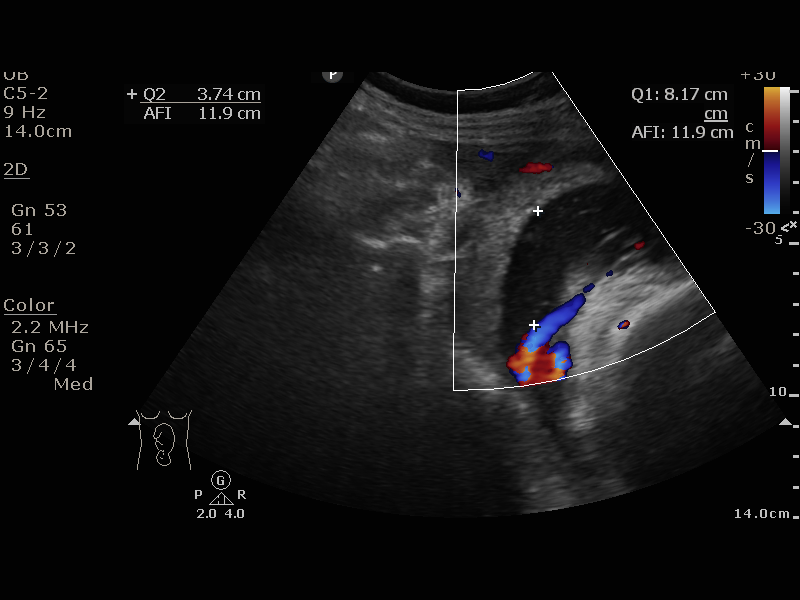
[im 8/10]
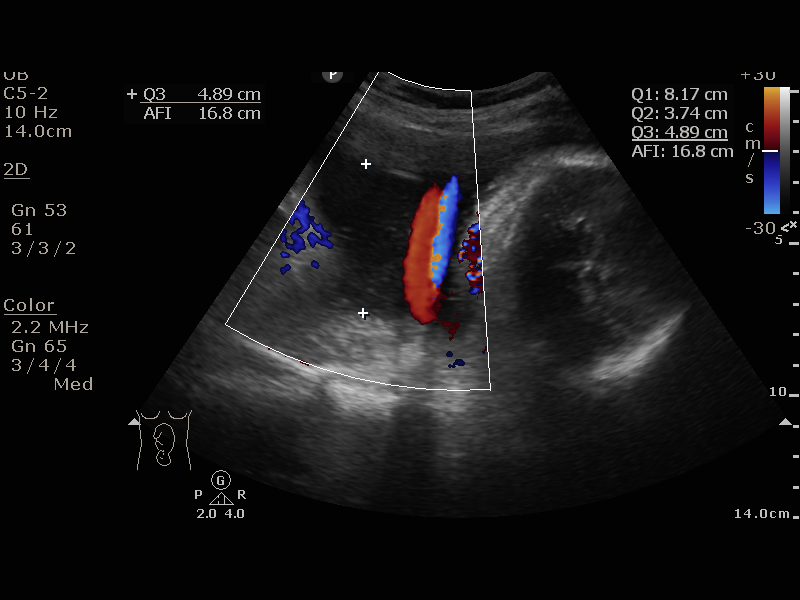
[im 9/10]
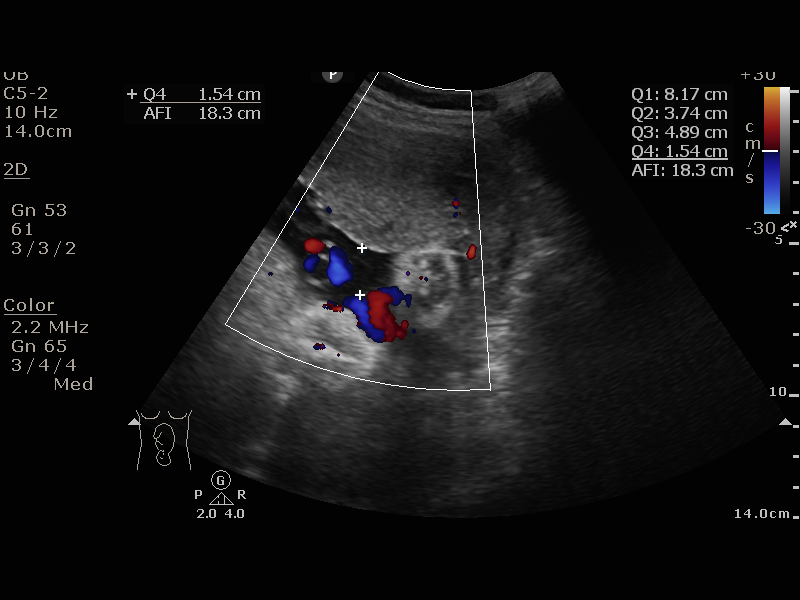
[im 10/10]
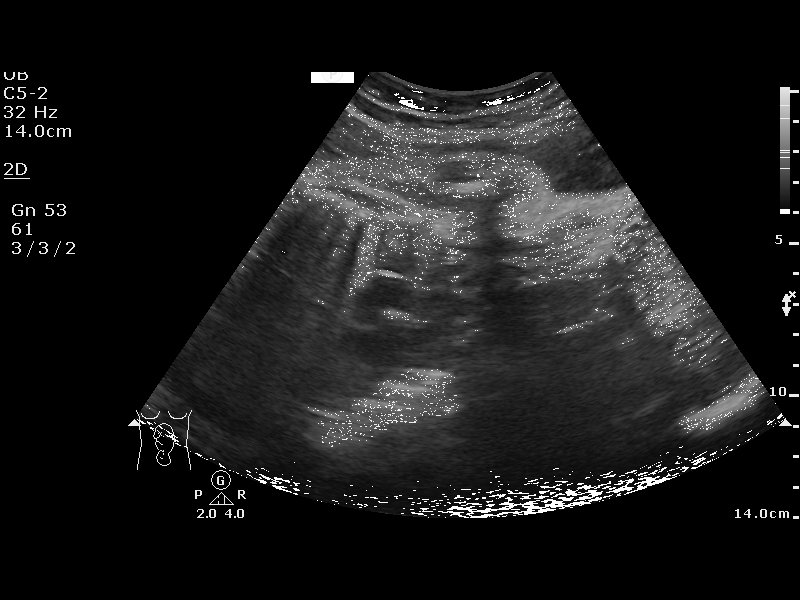

[10 of 10 positions shown; findings below may reference images not displayed]

Road [HOSPITAL]
[facility]
[REDACTED]

1  US FETAL BPP W/NONSTRESS                    76818.4

1  T-CHEMISE GAUSSAINT              [id number]      [id number]     [id number]
Service(s) Provided

Indications

35 weeks gestation of pregnancy
Gestational diabetes in pregnancy,
controlled by oral hypoglycemic drugs
OB History

Blood Type:            Height:  5'6"   Weight (lb):  146       BMI:
Gravidity:    2         Term:   1
Living:       1
Fetal Evaluation

Num Of Fetuses:     1
Preg. Location:     Intrauterine
Cardiac Activity:   Observed
Presentation:       Cephalic

Amniotic Fluid
AFI FV:      Subjectively within normal limits

AFI Sum(cm)     %Tile       Largest Pocket(cm)
18.34           68

RUQ(cm)       RLQ(cm)       LUQ(cm)        LLQ(cm)
8.17
Biophysical Evaluation

Amniotic F.V:   Pocket => 2 cm two         F. Tone:        Observed
planes
F. Movement:    Observed                   N.S.T:          Reactive
F. Breathing:   Not Observed               Score:          [DATE]
Gestational Age

LMP:           35w 3d        Date:  12/22/16                 EDD:   09/28/17
Best:          35w 3d     Det. By:  LMP  (12/22/16)          EDD:   09/28/17
Impression

BPP [DATE], no breathing observed
Recommendations

US for growth and BPP scheduled for 09/11/17

## 2018-11-15 ENCOUNTER — Emergency Department (HOSPITAL_COMMUNITY)
Admission: EM | Admit: 2018-11-15 | Discharge: 2018-11-15 | Disposition: A | Discharge status: Home / Self Care | Payer: BLUE CROSS/BLUE SHIELD | Attending: Emergency Medicine | Admitting: Emergency Medicine

## 2018-11-15 ENCOUNTER — Emergency Department (HOSPITAL_COMMUNITY): Payer: BLUE CROSS/BLUE SHIELD

## 2018-11-15 ENCOUNTER — Encounter (HOSPITAL_COMMUNITY): Payer: Self-pay | Admitting: *Deleted

## 2018-11-15 DIAGNOSIS — Z87891 Personal history of nicotine dependence: Secondary | ICD-10-CM | POA: Insufficient documentation

## 2018-11-15 DIAGNOSIS — R4182 Altered mental status, unspecified: Secondary | ICD-10-CM | POA: Diagnosis not present

## 2018-11-15 LAB — CBC WITH DIFFERENTIAL/PLATELET
ABS IMMATURE GRANULOCYTES: 0.01 10*3/uL (ref 0.00–0.07)
Basophils Absolute: 0 10*3/uL (ref 0.0–0.1)
Basophils Relative: 0 %
Eosinophils Absolute: 0 10*3/uL (ref 0.0–0.5)
Eosinophils Relative: 0 %
HCT: 38.1 % (ref 36.0–46.0)
Hemoglobin: 12.2 g/dL (ref 12.0–15.0)
Immature Granulocytes: 0 %
Lymphocytes Relative: 20 %
Lymphs Abs: 1.3 10*3/uL (ref 0.7–4.0)
MCH: 27.5 pg (ref 26.0–34.0)
MCHC: 32 g/dL (ref 30.0–36.0)
MCV: 86 fL (ref 80.0–100.0)
Monocytes Absolute: 0.6 10*3/uL (ref 0.1–1.0)
Monocytes Relative: 9 %
NEUTROS ABS: 4.4 10*3/uL (ref 1.7–7.7)
Neutrophils Relative %: 71 %
Platelets: 233 10*3/uL (ref 150–400)
RBC: 4.43 MIL/uL (ref 3.87–5.11)
RDW: 12.8 % (ref 11.5–15.5)
WBC: 6.3 10*3/uL (ref 4.0–10.5)
nRBC: 0 % (ref 0.0–0.2)

## 2018-11-15 LAB — URINALYSIS, ROUTINE W REFLEX MICROSCOPIC
Bacteria, UA: NONE SEEN
Bilirubin Urine: NEGATIVE
Glucose, UA: NEGATIVE mg/dL
Ketones, ur: NEGATIVE mg/dL
Leukocytes, UA: NEGATIVE
Nitrite: NEGATIVE
Protein, ur: 30 mg/dL — AB
Specific Gravity, Urine: 1.027 (ref 1.005–1.030)
pH: 5 (ref 5.0–8.0)

## 2018-11-15 LAB — I-STAT CHEM 8, ED
BUN: 18 mg/dL (ref 6–20)
CALCIUM ION: 1.19 mmol/L (ref 1.15–1.40)
Chloride: 104 mmol/L (ref 98–111)
Creatinine, Ser: 0.5 mg/dL (ref 0.44–1.00)
Glucose, Bld: 115 mg/dL — ABNORMAL HIGH (ref 70–99)
HCT: 38 % (ref 36.0–46.0)
Hemoglobin: 12.9 g/dL (ref 12.0–15.0)
Potassium: 3.5 mmol/L (ref 3.5–5.1)
Sodium: 139 mmol/L (ref 135–145)
TCO2: 24 mmol/L (ref 22–32)

## 2018-11-15 LAB — RAPID URINE DRUG SCREEN, HOSP PERFORMED
Amphetamines: NOT DETECTED
Barbiturates: NOT DETECTED
Benzodiazepines: NOT DETECTED
Cocaine: NOT DETECTED
Opiates: NOT DETECTED
Tetrahydrocannabinol: NOT DETECTED

## 2018-11-15 LAB — COMPREHENSIVE METABOLIC PANEL
ALT: 17 U/L (ref 0–44)
AST: 21 U/L (ref 15–41)
Albumin: 4.6 g/dL (ref 3.5–5.0)
Alkaline Phosphatase: 68 U/L (ref 38–126)
Anion gap: 9 (ref 5–15)
BILIRUBIN TOTAL: 0.6 mg/dL (ref 0.3–1.2)
BUN: 20 mg/dL (ref 6–20)
CO2: 26 mmol/L (ref 22–32)
CREATININE: 0.55 mg/dL (ref 0.44–1.00)
Calcium: 9.3 mg/dL (ref 8.9–10.3)
Chloride: 104 mmol/L (ref 98–111)
GFR calc Af Amer: >60 mL/min (ref >60)
GFR calc non Af Amer: >60 mL/min (ref >60)
Glucose, Bld: 117 mg/dL — ABNORMAL HIGH (ref 70–99)
Potassium: 3.4 mmol/L — ABNORMAL LOW (ref 3.5–5.1)
Sodium: 139 mmol/L (ref 135–145)
TOTAL PROTEIN: 8.3 g/dL — AB (ref 6.5–8.1)

## 2018-11-15 LAB — CBG MONITORING, ED: Glucose-Capillary: 100 mg/dL — ABNORMAL HIGH (ref 70–99)

## 2018-11-15 LAB — ETHANOL: Alcohol, Ethyl (B): <10 mg/dL (ref <10)

## 2018-11-15 LAB — ACETAMINOPHEN LEVEL: Acetaminophen (Tylenol), Serum: <10 ug/mL — ABNORMAL LOW (ref 10–30)

## 2018-11-15 LAB — PREGNANCY, URINE: Preg Test, Ur: NEGATIVE

## 2018-11-15 LAB — I-STAT BETA HCG BLOOD, ED (MC, WL, AP ONLY)

## 2018-11-15 LAB — SALICYLATE LEVEL: Salicylate Lvl: <7 mg/dL (ref 2.8–30.0)

## 2018-11-15 MED ORDER — KETOROLAC TROMETHAMINE 15 MG/ML IJ SOLN
15.0000 mg | Freq: Once | INTRAMUSCULAR | Status: AC
  Administered 2018-11-15: 15 mg via INTRAVENOUS
  Filled 2018-11-15: qty 1

## 2018-11-15 MED ORDER — METOCLOPRAMIDE HCL 5 MG/ML IJ SOLN
10.0000 mg | Freq: Once | INTRAMUSCULAR | Status: AC
  Administered 2018-11-15: 10 mg via INTRAVENOUS
  Filled 2018-11-15: qty 2

## 2018-11-15 MED ORDER — DIPHENHYDRAMINE HCL 50 MG/ML IJ SOLN
12.5000 mg | Freq: Once | INTRAMUSCULAR | Status: AC
  Administered 2018-11-15: 12.5 mg via INTRAVENOUS
  Filled 2018-11-15: qty 1

## 2018-11-15 NOTE — ED Notes (Signed)
Pt has hand over eyes, she will moan when ask questions or shake head at present time.

## 2018-11-15 NOTE — Discharge Instructions (Addendum)
You have been seen today for altered mental status. Please read and follow all provided instructions.  ° °1. Medications: usual home medications °2. Treatment: rest, drink plenty of fluids °3. Follow Up: Please follow up with your primary doctor in 2 days for discussion of your diagnoses and further evaluation after today's visit; if you do not have a primary care doctor use the resource guide provided to find one; Please return to the ER for any new or worsening symptoms. Please obtain all of your results from medical records or have your doctors office obtain the results - share them with your doctor - you should be seen at your doctors office. Call today to arrange your follow up.  ° °Take medications as prescribed. Please review all of the medicines and only take them if you do not have an allergy to them. Return to the emergency room for worsening condition or new concerning symptoms. Follow up with your regular doctor. If you don't have a regular doctor use one of the numbers below to establish a primary care doctor. ° °Please be aware that if you are taking birth control pills, taking other prescriptions, ESPECIALLY ANTIBIOTICS may make the birth control ineffective - if this is the case, either do not engage in sexual activity or use alternative methods of birth control such as condoms until you have finished the medicine and your family doctor says it is OK to restart them. If you are on a blood thinner such as COUMADIN, be aware that any other medicine that you take may cause the coumadin to either work too much, or not enough - you should have your coumadin level rechecked in next 7 days if this is the case.  °?  °It is also a possibility that you have an allergic reaction to any of the medicines that you have been prescribed - Everybody reacts differently to medications and while MOST people have no trouble with most medicines, you may have a reaction such as nausea, vomiting, rash, swelling, shortness  of breath. If this is the case, please stop taking the medicine immediately and contact your physician.  °?  °You should return to the ER if you develop severe or worsening symptoms.  ° °Emergency Department Resource Guide °1) Find a Doctor and Pay Out of Pocket °Although you won't have to find out who is covered by your insurance plan, it is a good idea to ask around and get recommendations. You will then need to call the office and see if the doctor you have chosen will accept you as a new patient and what types of options they offer for patients who are self-pay. Some doctors offer discounts or will set up payment plans for their patients who do not have insurance, but you will need to ask so you aren't surprised when you get to your appointment. ° °2) Contact Your Local Health Department °Not all health departments have doctors that can see patients for sick visits, but many do, so it is worth a call to see if yours does. If you don't know where your local health department is, you can check in your phone book. The CDC also has a tool to help you locate your state's health department, and many state websites also have listings of all of their local health departments. ° °3) Find a Walk-in Clinic °If your illness is not likely to be very severe or complicated, you may want to try a walk in clinic. These are popping up all   over the country in pharmacies, drugstores, and shopping centers. They're usually staffed by nurse practitioners or physician assistants that have been trained to treat common illnesses and complaints. They're usually fairly quick and inexpensive. However, if you have serious medical issues or chronic medical problems, these are probably not your best option. ° °No Primary Care Doctor: °Call [facility] at  832-8000 - they can help you locate a primary care doctor that  accepts your insurance, provides certain services, etc. °Physician Referral Service- 1-800-533-3463 ° °Emergency Department  Resource Guide °1) Find a Doctor and Pay Out of Pocket °Although you won't have to find out who is covered by your insurance plan, it is a good idea to ask around and get recommendations. You will then need to call the office and see if the doctor you have chosen will accept you as a new patient and what types of options they offer for patients who are self-pay. Some doctors offer discounts or will set up payment plans for their patients who do not have insurance, but you will need to ask so you aren't surprised when you get to your appointment. ° °2) Contact Your Local Health Department °Not all health departments have doctors that can see patients for sick visits, but many do, so it is worth a call to see if yours does. If you don't know where your local health department is, you can check in your phone book. The CDC also has a tool to help you locate your state's health department, and many state websites also have listings of all of their local health departments. ° °3) Find a Walk-in Clinic °If your illness is not likely to be very severe or complicated, you may want to try a walk in clinic. These are popping up all over the country in pharmacies, drugstores, and shopping centers. They're usually staffed by nurse practitioners or physician assistants that have been trained to treat common illnesses and complaints. They're usually fairly quick and inexpensive. However, if you have serious medical issues or chronic medical problems, these are probably not your best option. ° °No Primary Care Doctor: °Call [facility] at  832-8000 - they can help you locate a primary care doctor that  accepts your insurance, provides certain services, etc. °Physician Referral Service- 1-800-533-3463 ° °Chronic Pain Problems: °Organization         Address  Phone   Notes  °[facility]  ([phone]) [phone] Patients need to be referred by their primary care doctor.  ° °Medication Assistance: °Organization          Address  Phone   Notes  °[place] [facility] [address] °[place], [place] [address] [phone]) [phone] --Must be a resident of [place] °-- Must have NO insurance coverage whatsoever (no Medicaid/ Medicare, etc.) °-- The pt. MUST have a primary care doctor that directs their care regularly and follows them in the community °  °[facility]  (866) 331-1348   °[employer]  (888) 892-1162   ° °Agencies that provide inexpensive medical care: °Organization         Address  Phone   Notes  °[facility]  ([phone]) [phone]   °[facility]    ([phone]) [phone]   °[facility] [address] °[place], [place] [address] ([phone]) [phone]   °[facility]  [address], ° (336) [phone]   °[facility]    (336) 373-0678   °  [facility]    (336) 272-1050   °[facility] ° [address], [place] Phone:  (336) [phone], Fax:  ([phone]) [phone] Hours of Operation:  9 am - 6 pm, M-F.  Also accepts Medicaid/Medicare and self-pay.  °[facility] ° [address], [place] Phone: ([phone]) [phone], Fax: ([phone]) [phone]. Hours of Operation:  8:30 am - 5:30 pm, M-F.  Also accepts Medicaid and self-pay.  °[facility] [place] [address], [place] Phone: ([phone]) [phone]   °[facility] [address], [place], [place] (336)[phone], Ext. 123 Mondays & Thursdays: 7-9 AM.  First 15 patients are seen on a first come, first serve basis. °  ° °Medicaid-accepting [place] Providers: ° °Organization         Address  Phone   Notes  °[facility] [address], [place] [phone] Also accepts self-pay patients.  °[facility] [address], [place] ° ([phone]) [phone]   °[facility] [address], [place] ([phone]) [phone]   °[facility] [address] [place] [address], [place] ([phone]) [phone]   °[facility] [address], [place]  ° ([phone]) [phone] Only accepts [place] Access Medicaid patients after they have their name applied to their card.  ° °Self-Pay (no insurance) in [place]: ° °Organization         Address  Phone   Notes  °Sickle Cell Patients, [facility] [address], [place] ([phone]) [phone]   °[facility] [address], [place] ([phone]) [phone]   °[facility] [place] ° [address], [place] ([phone]) [phone]   °[facility]/Dr. Osei-Bonsu ° [address] [place] [address], [place] or [address], [place] [phone]) [phone] Phone number for both [place] and [place] locations is the same.  °[facility] [address], [place] ([phone]) [phone]   °[facility] [address] [place] [address], [place] or [address] ([phone]) [phone] °([phone]) [phone]   °[facility] [address], [place] ([phone]) [phone], phone; ([phone]) [phone], fax Sees patients 1st and 3rd Saturday of every month.  Must not qualify for public or private insurance (i.e. Medicaid, Medicare, [place] Health Choice, Veterans' Benefits)  Household income should be no more than 200% of the poverty level The clinic cannot treat you if you are pregnant or think you are pregnant  Sexually transmitted diseases are not treated at the clinic.  °  ° °
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
## 2018-11-15 NOTE — ED Notes (Signed)
Bed: [id number] Expected date:  Expected time:  Means of arrival:  Comments: EMS/catatonia

## 2019-01-28 ENCOUNTER — Other Ambulatory Visit: Payer: Self-pay

## 2019-01-28 ENCOUNTER — Encounter (HOSPITAL_COMMUNITY): Payer: Self-pay | Admitting: *Deleted

## 2019-01-28 ENCOUNTER — Emergency Department (HOSPITAL_COMMUNITY)
Admission: EM | Admit: 2019-01-28 | Discharge: 2019-01-28 | Disposition: A | Discharge status: Home / Self Care | Payer: BLUE CROSS/BLUE SHIELD | Attending: Emergency Medicine | Admitting: Emergency Medicine

## 2019-01-28 DIAGNOSIS — R69 Illness, unspecified: Secondary | ICD-10-CM

## 2019-01-28 DIAGNOSIS — Z87891 Personal history of nicotine dependence: Secondary | ICD-10-CM | POA: Insufficient documentation

## 2019-01-28 DIAGNOSIS — J111 Influenza due to unidentified influenza virus with other respiratory manifestations: Secondary | ICD-10-CM

## 2019-01-28 DIAGNOSIS — R509 Fever, unspecified: Secondary | ICD-10-CM | POA: Insufficient documentation

## 2019-01-28 DIAGNOSIS — M791 Myalgia, unspecified site: Secondary | ICD-10-CM | POA: Insufficient documentation

## 2019-01-28 DIAGNOSIS — J029 Acute pharyngitis, unspecified: Secondary | ICD-10-CM | POA: Diagnosis not present

## 2019-01-28 MED ORDER — BENZONATATE 200 MG PO CAPS: 200.0000 mg | ORAL_CAPSULE | Freq: Two times a day (BID) | ORAL | 0 refills | Status: DC | PRN

## 2019-01-28 NOTE — ED Triage Notes (Signed)
Pt reports fever, bodyaches, headache since yesterday.

## 2019-01-28 NOTE — ED Provider Notes (Signed)
[facility] EMERGENCY DEPARTMENT Provider Note   CSN: [id number] Arrival date & time: 01/28/19  4431    History   Chief Complaint Chief Complaint  Patient presents with  . Influenza    HPI Katrina Blackwell is a 33 y.o. female.     33 year old female presents with complaint of body aches, fevers, sinus congestion and sore throat.  Patient states her symptoms started originally 4 days ago and then developed fever yesterday with max temp of 103.  Patient is taking ibuprofen for her symptoms with improvement.  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  No other complaints or concerns.     Past Medical History:  Diagnosis Date  . Anxiety   . VQMGQQPY(195.0)    MIGRAINES;OTC    Patient Active Problem List   Diagnosis Date Noted  . Abnormal glucose tolerance test 12/31/2017  . Language barrier 05/12/2013  . Generalized anxiety disorder 05/11/2013  . Migraines 05/11/2013    Past Surgical History:  Procedure Laterality Date  . TONSILLECTOMY  2009     OB History    Gravida  2   Para  2   Term  2   Preterm      AB      Living  2     SAB      TAB      Ectopic      Multiple  0   Live Births  2            Home Medications    Prior to Admission medications   Medication Sig Start Date End Date Taking? Authorizing Provider  acetaminophen (TYLENOL) 500 MG tablet Take 500 mg by mouth every 6 (six) hours as needed for moderate pain or headache.    [provider]  benzonatate (TESSALON) 200 MG capsule Take 1 capsule (200 mg total) by mouth 2 (two) times daily as needed for cough. 01/28/19   Jeannie Fend, PA-C  ibuprofen (ADVIL,MOTRIN) 600 MG tablet Take 1 tablet (600 mg total) by mouth every 6 (six) hours. Patient not taking: Reported on 11/15/2018 09/11/17   Arlyce Harman,  DO  Prenat-FeAsp-Meth-FA-DHA w/o A (PRENATE PIXIE) 10-0.6-0.4-200 MG CAPS Take 1 tablet by mouth daily. Patient not taking: Reported on 11/15/2018 10/22/17   Adam Phenix, MD  Prenatal Vit-Fe Fumarate-FA (PRENATAL MULTIVITAMIN) TABS tablet Take 1 tablet by mouth daily at 12 noon.    [provider]    Family History Family History  Problem Relation Age of Onset  . Hyperlipidemia Mother   . Diabetes Father   . Heart disease Father   . Other Neg Hx     Social History Social History   Tobacco Use  . Smoking status: Former Smoker    Packs/day: 0.05    Types: Cigarettes  . Smokeless tobacco: Never Used  Substance Use Topics  . Alcohol use: No  . Drug use: No     Allergies   Patient has no known allergies.   Review of Systems Review of Systems  Constitutional: Positive for fever.  HENT: Positive for congestion and sore throat. Negative for rhinorrhea, sinus pressure and sinus pain.   Respiratory: Negative for cough and shortness of breath.   Gastrointestinal: Positive for nausea.  Musculoskeletal: Positive for arthralgias and myalgias.  Skin: Negative for rash and wound.  Allergic/Immunologic: Negative  for immunocompromised state.  Neurological: Negative for weakness.  Hematological: Negative for adenopathy.  Psychiatric/Behavioral: Negative for confusion.  All other systems reviewed and are negative.    Physical Exam Updated Vital Signs BP 114/83 (BP Location: Right Arm)   Pulse (!) 108   Temp 99.1 F (37.3 C) (Oral)   Resp 20   SpO2 98%   Physical Exam Vitals signs and nursing note reviewed.  Constitutional:      General: She is not in acute distress.    Appearance: She is well-developed. She is not diaphoretic.  HENT:     Head: Normocephalic and atraumatic.     Right Ear: Tympanic membrane and ear canal normal.     Left Ear: Tympanic membrane and ear canal normal.     Nose: Congestion present.     Mouth/Throat:     Mouth: Mucous membranes  are moist.     Pharynx: No oropharyngeal exudate or posterior oropharyngeal erythema.  Eyes:     Conjunctiva/sclera: Conjunctivae normal.  Neck:     Musculoskeletal: Neck supple.  Cardiovascular:     Rate and Rhythm: Normal rate and regular rhythm.     Pulses: Normal pulses.     Heart sounds: Normal heart sounds. No murmur.  Pulmonary:     Effort: Pulmonary effort is normal.     Breath sounds: Normal breath sounds.  Lymphadenopathy:     Cervical: No cervical adenopathy.  Skin:    General: Skin is warm and dry.     Findings: No erythema or rash.  Neurological:     Mental Status: She is alert and oriented to person, place, and time.  Psychiatric:        Behavior: Behavior normal.      ED Treatments / Results  Labs (all labs ordered are listed, but only abnormal results are displayed) Labs Reviewed - No data to display  EKG None  Radiology No results found.  Procedures Procedures (including critical care time)  Medications Ordered in ED Medications - No data to display   Initial Impression / Assessment and Plan / ED Course  I have reviewed the triage vital signs and the nursing notes.  Pertinent labs & imaging results that were available during my care of the patient were reviewed by me and considered in my medical decision making (see chart for details).  Clinical Course as of Jan 28 924  Wed Jan 28, 2019  3216 33 year old female presents with URI symptoms x4 days with onset of fever yesterday with max temp of 103.  Patient did get a flu shot this year.  No risk factors for covid19.  Patient is outside of the window for treatment with Tamiflu or Xofluza, given Tessalon should she develop a cough otherwise recommend symptomatic treatment, return to ER for severe or concerning symptoms, given note for work or school.   [LM]    Clinical Course User Index [LM] Jeannie Fend, PA-C   Final Clinical Impressions(s) / ED Diagnoses   Final diagnoses:  Influenza-like  illness    ED Discharge Orders         Ordered    benzonatate (TESSALON) 200 MG capsule  2 times daily PRN     01/28/19 0920           Jeannie Fend, PA-C 01/28/19 3578    Derwood Kaplan, MD 01/30/19 1825

## 2019-01-28 NOTE — ED Notes (Signed)
Pt verbalized understanding of discharge instructions and denies any further questions at this time.   

## 2019-01-28 NOTE — Discharge Instructions (Addendum)
Follow-up with your doctor as needed, return to ER for severe or concerning symptoms Home to rest, drink plenty of hydrating fluids such as Gatorade Use saline sinus rinse twice daily.  Use Flonase daily.  Take Zyrtec daily. Take Motrin and Tylenol as needed as directed for fevers and body aches. Given prescription for Tessalon, if you have a cough you may take this medication.

## 2019-05-26 ENCOUNTER — Encounter (HOSPITAL_COMMUNITY): Payer: Self-pay | Admitting: *Deleted

## 2019-05-26 ENCOUNTER — Other Ambulatory Visit: Payer: Self-pay

## 2019-05-26 ENCOUNTER — Emergency Department (HOSPITAL_COMMUNITY)
Admission: EM | Admit: 2019-05-26 | Discharge: 2019-05-27 | Disposition: A | Discharge status: Home / Self Care | Payer: BC Managed Care – PPO | Attending: Emergency Medicine | Admitting: Emergency Medicine

## 2019-05-26 DIAGNOSIS — N390 Urinary tract infection, site not specified: Secondary | ICD-10-CM | POA: Diagnosis not present

## 2019-05-26 DIAGNOSIS — Z79899 Other long term (current) drug therapy: Secondary | ICD-10-CM | POA: Diagnosis not present

## 2019-05-26 DIAGNOSIS — R300 Dysuria: Secondary | ICD-10-CM | POA: Diagnosis present

## 2019-05-26 DIAGNOSIS — Z87891 Personal history of nicotine dependence: Secondary | ICD-10-CM | POA: Insufficient documentation

## 2019-05-26 LAB — POC URINE PREG, ED: Preg Test, Ur: NEGATIVE

## 2019-05-26 NOTE — ED Triage Notes (Signed)
Pt with right lower back pain and burning with urination. No n/v/d, fever, or vaginal discharge.

## 2019-05-27 DIAGNOSIS — N390 Urinary tract infection, site not specified: Secondary | ICD-10-CM | POA: Diagnosis not present

## 2019-05-27 LAB — URINALYSIS, ROUTINE W REFLEX MICROSCOPIC
Bilirubin Urine: NEGATIVE
Glucose, UA: NEGATIVE mg/dL
Ketones, ur: NEGATIVE mg/dL
Nitrite: NEGATIVE
Protein, ur: NEGATIVE mg/dL
Specific Gravity, Urine: 1.008 (ref 1.005–1.030)
pH: 7 (ref 5.0–8.0)

## 2019-05-27 MED ORDER — PHENAZOPYRIDINE HCL 100 MG PO TABS
95.0000 mg | ORAL_TABLET | Freq: Once | ORAL | Status: AC
  Administered 2019-05-27: 100 mg via ORAL
  Filled 2019-05-27: qty 1

## 2019-05-27 MED ORDER — PHENAZOPYRIDINE HCL 200 MG PO TABS: 200.0000 mg | ORAL_TABLET | Freq: Three times a day (TID) | ORAL | 0 refills | Status: DC | PRN

## 2019-05-27 MED ORDER — FOSFOMYCIN TROMETHAMINE 3 G PO PACK
3.0000 g | PACK | Freq: Once | ORAL | Status: AC
  Administered 2019-05-27: 3 g via ORAL
  Filled 2019-05-27: qty 3

## 2019-05-27 NOTE — Discharge Instructions (Signed)
Take the prescribed medication as directed.  This will turn your urine bright orange/red in color which is normal. Follow-up with your primary care doctor. Return to the ED for new or worsening symptoms.

## 2019-05-27 NOTE — ED Provider Notes (Signed)
[facility] EMERGENCY DEPARTMENT Provider Note   CSN: [id number] Arrival date & time: 05/26/19  2230     History   Chief Complaint Chief Complaint  Patient presents with  . Dysuria    HPI Katrina Blackwell is a 33 y.o. female.     The history is provided by the patient and medical records.     33 year old female with history of anxiety, migraine headaches, presenting to the ED for dysuria.  Reports this is been ongoing for 4 days.  States she feels the urge to urinate every 5 minutes or so but sometimes is only making a very small amount of urine.  She denies any hematuria.  She is not had any pelvic pain or vaginal discharge.  States she does have some pressure in her lower abdomen, seems to worsen with urination.  She has not had any nausea, vomiting, diarrhea, or flank pain.  No fever or chills.  States she is currently on amoxicillin for a dental infection.  Past Medical History:  Diagnosis Date  . Anxiety   . PPIRJJOA(416.6)    MIGRAINES;OTC    Patient Active Problem List   Diagnosis Date Noted  . Abnormal glucose tolerance test 12/31/2017  . Language barrier 05/12/2013  . Generalized anxiety disorder 05/11/2013  . Migraines 05/11/2013    Past Surgical History:  Procedure Laterality Date  . TONSILLECTOMY  2009     OB History    Gravida  2   Para  2   Term  2   Preterm      AB      Living  2     SAB      TAB      Ectopic      Multiple  0   Live Births  2            Home Medications    Prior to Admission medications   Medication Sig Start Date End Date Taking? Authorizing Provider  acetaminophen (TYLENOL) 500 MG tablet Take 500 mg by mouth every 6 (six) hours as needed for moderate pain or headache.    [provider]  benzonatate (TESSALON) 200 MG capsule Take 1 capsule (200 mg total) by mouth 2 (two) times daily as needed for cough. 01/28/19   Tacy Learn, PA-C  ibuprofen (ADVIL,MOTRIN) 600 MG tablet Take 1  tablet (600 mg total) by mouth every 6 (six) hours. Patient not taking: Reported on 11/15/2018 09/11/17   Nuala Alpha, DO  Prenat-FeAsp-Meth-FA-DHA w/o A (PRENATE PIXIE) 10-0.6-0.4-200 MG CAPS Take 1 tablet by mouth daily. Patient not taking: Reported on 11/15/2018 10/22/17   Woodroe Mode, MD  Prenatal Vit-Fe Fumarate-FA (PRENATAL MULTIVITAMIN) TABS tablet Take 1 tablet by mouth daily at 12 noon.    [provider]    Family History Family History  Problem Relation Age of Onset  . Hyperlipidemia Mother   . Diabetes Father   . Heart disease Father   . Other Neg Hx     Social History Social History   Tobacco Use  . Smoking status: Former Smoker    Packs/day: 0.05    Types: Cigarettes  . Smokeless tobacco: Never Used  Substance Use Topics  . Alcohol use: No  . Drug use: No     Allergies   Patient has no known allergies.   Review of Systems Review of Systems  Genitourinary: Positive for dysuria and frequency.  All other systems reviewed and are negative.  Physical Exam Updated Vital Signs BP 106/80 (BP Location: Right Arm)   Pulse 100   Temp 98.1 F (36.7 C) (Oral)   Resp 18   SpO2 100%   Physical Exam Vitals signs and nursing note reviewed.  Constitutional:      Appearance: She is well-developed.  HENT:     Head: Normocephalic and atraumatic.  Eyes:     Conjunctiva/sclera: Conjunctivae normal.     Pupils: Pupils are equal, round, and reactive to light.  Neck:     Musculoskeletal: Normal range of motion.  Cardiovascular:     Rate and Rhythm: Normal rate and regular rhythm.     Heart sounds: Normal heart sounds.  Pulmonary:     Effort: Pulmonary effort is normal.     Breath sounds: Normal breath sounds.  Abdominal:     General: Bowel sounds are normal.     Palpations: Abdomen is soft.     Tenderness: There is abdominal tenderness in the suprapubic area. There is no right CVA tenderness or left CVA tenderness.  Musculoskeletal: Normal  range of motion.  Skin:    General: Skin is warm and dry.  Neurological:     Mental Status: She is alert and oriented to person, place, and time.      ED Treatments / Results  Labs (all labs ordered are listed, but only abnormal results are displayed) Labs Reviewed  URINALYSIS, ROUTINE W REFLEX MICROSCOPIC - Abnormal; Notable for the following components:      Result Value   Color, Urine STRAW (*)    APPearance HAZY (*)    Hgb urine dipstick MODERATE (*)    Leukocytes,Ua LARGE (*)    Bacteria, UA RARE (*)    All other components within normal limits  POC URINE PREG, ED    EKG None  Radiology No results found.  Procedures Procedures (including critical care time)  Medications Ordered in ED Medications  phenazopyridine (PYRIDIUM) tablet 100 mg (100 mg Oral Given 05/27/19 0104)  fosfomycin (MONUROL) packet 3 g (3 g Oral Given 05/27/19 0105)     Initial Impression / Assessment and Plan / ED Course  I have reviewed the triage vital signs and the nursing notes.  Pertinent labs & imaging results that were available during my care of the patient were reviewed by me and considered in my medical decision making (see chart for details).  33 y.o. F here with dysuria and urinary frequency.  Triage note reports flank pain however she denies this to me.  She is afebrile, non-toxic.  Abdomen soft, very mild tenderness suprapubically.  She is currently on amoxicillin for dental infection but as she has not improved on this, will need expanded coverage.  As I do not suspect pyelonephritis, she was given one time dose of fosfomycin here to avoid dual abx.  Rx pyridium-- advised this will turn urine bright orange/red which is normal.  Follow-up with PCP.  Return here for any new/acute changes.  Final Clinical Impressions(s) / ED Diagnoses   Final diagnoses:  Lower urinary tract infectious disease    ED Discharge Orders         Ordered    phenazopyridine (PYRIDIUM) 200 MG tablet  3  times daily PRN     05/27/19 0055           Garlon HatchetSanders, Lisa M, PA-C 05/27/19 0324    Nira Connardama, Pedro Eduardo, MD 05/27/19 [phone]

## 2019-09-23 ENCOUNTER — Other Ambulatory Visit: Payer: Self-pay

## 2019-09-23 DIAGNOSIS — Z20822 Contact with and (suspected) exposure to covid-19: Secondary | ICD-10-CM

## 2019-09-24 LAB — NOVEL CORONAVIRUS, NAA: SARS-CoV-2, NAA: NOT DETECTED

## 2019-10-21 ENCOUNTER — Other Ambulatory Visit: Payer: Self-pay

## 2019-10-21 ENCOUNTER — Encounter (HOSPITAL_COMMUNITY): Payer: Self-pay | Admitting: Emergency Medicine

## 2019-10-21 ENCOUNTER — Emergency Department (HOSPITAL_COMMUNITY)
Admission: EM | Admit: 2019-10-21 | Discharge: 2019-10-22 | Disposition: A | Discharge status: Home / Self Care | Payer: BC Managed Care – PPO | Attending: Emergency Medicine | Admitting: Emergency Medicine

## 2019-10-21 DIAGNOSIS — R112 Nausea with vomiting, unspecified: Secondary | ICD-10-CM

## 2019-10-21 DIAGNOSIS — Z87891 Personal history of nicotine dependence: Secondary | ICD-10-CM | POA: Insufficient documentation

## 2019-10-21 DIAGNOSIS — R10816 Epigastric abdominal tenderness: Secondary | ICD-10-CM | POA: Diagnosis not present

## 2019-10-21 LAB — CBC
HCT: 41.4 % (ref 36.0–46.0)
Hemoglobin: 13.7 g/dL (ref 12.0–15.0)
MCH: 27.6 pg (ref 26.0–34.0)
MCHC: 33.1 g/dL (ref 30.0–36.0)
MCV: 83.3 fL (ref 80.0–100.0)
Platelets: 240 10*3/uL (ref 150–400)
RBC: 4.97 MIL/uL (ref 3.87–5.11)
RDW: 12.2 % (ref 11.5–15.5)
WBC: 5 10*3/uL (ref 4.0–10.5)
nRBC: 0 % (ref 0.0–0.2)

## 2019-10-21 LAB — URINALYSIS, ROUTINE W REFLEX MICROSCOPIC
Bacteria, UA: NONE SEEN
Bilirubin Urine: NEGATIVE
Glucose, UA: NEGATIVE mg/dL
Hgb urine dipstick: NEGATIVE
Ketones, ur: 20 mg/dL — AB
Nitrite: NEGATIVE
Protein, ur: 30 mg/dL — AB
Specific Gravity, Urine: 1.023 (ref 1.005–1.030)
pH: 8 (ref 5.0–8.0)

## 2019-10-21 MED ORDER — SODIUM CHLORIDE 0.9% FLUSH: 3.0000 mL | Freq: Once | INTRAVENOUS | Status: DC

## 2019-10-21 NOTE — ED Triage Notes (Signed)
Patient reports nausea and emesis onset yesterday , no vomiting at arrival , denies abdominal pain , no fever or chills , no diarrhea or headache .

## 2019-10-22 ENCOUNTER — Emergency Department (HOSPITAL_COMMUNITY): Payer: BC Managed Care – PPO

## 2019-10-22 DIAGNOSIS — R112 Nausea with vomiting, unspecified: Secondary | ICD-10-CM | POA: Diagnosis not present

## 2019-10-22 LAB — COMPREHENSIVE METABOLIC PANEL
ALT: 16 U/L (ref 0–44)
AST: 23 U/L (ref 15–41)
Albumin: 4.3 g/dL (ref 3.5–5.0)
Alkaline Phosphatase: 52 U/L (ref 38–126)
Anion gap: 9 (ref 5–15)
BUN: 13 mg/dL (ref 6–20)
CO2: 26 mmol/L (ref 22–32)
Calcium: 8.8 mg/dL — ABNORMAL LOW (ref 8.9–10.3)
Chloride: 104 mmol/L (ref 98–111)
Creatinine, Ser: 0.6 mg/dL (ref 0.44–1.00)
GFR calc Af Amer: >60 mL/min (ref >60)
GFR calc non Af Amer: >60 mL/min (ref >60)
Glucose, Bld: 94 mg/dL (ref 70–99)
Potassium: 3.4 mmol/L — ABNORMAL LOW (ref 3.5–5.1)
Sodium: 139 mmol/L (ref 135–145)
Total Bilirubin: 1.3 mg/dL — ABNORMAL HIGH (ref 0.3–1.2)
Total Protein: 8.2 g/dL — ABNORMAL HIGH (ref 6.5–8.1)

## 2019-10-22 LAB — I-STAT BETA HCG BLOOD, ED (MC, WL, AP ONLY): I-stat hCG, quantitative: <5 m[IU]/mL (ref <5)

## 2019-10-22 LAB — LIPASE, BLOOD: Lipase: 20 U/L (ref 11–51)

## 2019-10-22 MED ORDER — METOCLOPRAMIDE HCL 5 MG/ML IJ SOLN
10.0000 mg | INTRAMUSCULAR | Status: AC
  Administered 2019-10-22: 10 mg via INTRAVENOUS
  Filled 2019-10-22: qty 2

## 2019-10-22 MED ORDER — ONDANSETRON 4 MG PO TBDP: 4.0000 mg | ORAL_TABLET | Freq: Three times a day (TID) | ORAL | 0 refills | Status: DC | PRN

## 2019-10-22 MED ORDER — FAMOTIDINE IN NACL 20-0.9 MG/50ML-% IV SOLN
20.0000 mg | INTRAVENOUS | Status: AC
  Administered 2019-10-22: 20 mg via INTRAVENOUS
  Filled 2019-10-22: qty 50

## 2019-10-22 MED ORDER — ONDANSETRON HCL 4 MG/2ML IJ SOLN
4.0000 mg | Freq: Once | INTRAMUSCULAR | Status: AC
  Administered 2019-10-22: 4 mg via INTRAVENOUS
  Filled 2019-10-22: qty 2

## 2019-10-22 MED ORDER — ACETAMINOPHEN 500 MG PO TABS
1000.0000 mg | ORAL_TABLET | Freq: Once | ORAL | Status: AC
  Administered 2019-10-22: 1000 mg via ORAL
  Filled 2019-10-22: qty 2

## 2019-10-22 MED ORDER — SODIUM CHLORIDE 0.9 % IV BOLUS
1000.0000 mL | Freq: Once | INTRAVENOUS | Status: AC
  Administered 2019-10-22: 1000 mL via INTRAVENOUS

## 2019-10-22 MED ORDER — KETOROLAC TROMETHAMINE 30 MG/ML IJ SOLN
15.0000 mg | Freq: Once | INTRAMUSCULAR | Status: AC
  Administered 2019-10-22: 15 mg via INTRAVENOUS
  Filled 2019-10-22: qty 1

## 2019-10-22 NOTE — ED Provider Notes (Signed)
[facility] Provider Note   CSN: [id number] Arrival date & time: 10/21/19  2311     History   Chief Complaint Chief Complaint  Patient presents with  . Emesis    HPI Katrina Blackwell is a 33 y.o. female.     33 year old female with a history of anxiety, headaches presents to the emergency department for evaluation of nausea and vomiting.  She states that symptoms began Wednesday morning.  She has been unable to tolerate food or fluids secondary to persistent emesis.  Reports 5 episodes of vomiting today.  No bloody emesis.  Notes some slight discomfort in her upper abdomen.  No medications taken PTA.  No diarrhea, melena, hematochezia, fever, chills, dysuria, hematuria, vaginal bleeding, vaginal discharge, headache.  No history of abdominal surgeries.  The history is provided by the patient. No language interpreter was used.  Emesis   Past Medical History:  Diagnosis Date  . Anxiety   . WUJWJXBJ(478.2Headache(784.0)    MIGRAINES;OTC    Patient Active Problem List   Diagnosis Date Noted  . Abnormal glucose tolerance test 12/31/2017  . Language barrier 05/12/2013  . Generalized anxiety disorder 05/11/2013  . Migraines 05/11/2013    Past Surgical History:  Procedure Laterality Date  . TONSILLECTOMY  2009     OB History    Gravida  2   Para  2   Term  2   Preterm      AB      Living  2     SAB      TAB      Ectopic      Multiple  0   Live Births  2            Home Medications    Prior to Admission medications   Medication Sig Start Date End Date Taking? Authorizing Provider  acetaminophen (TYLENOL) 500 MG tablet Take 500 mg by mouth every 6 (six) hours as needed for moderate pain or headache.    [provider]  benzonatate (TESSALON) 200 MG capsule Take 1 capsule (200 mg total) by mouth 2 (two) times daily as needed for cough. 01/28/19   Jeannie FendMurphy, Laura A, PA-C  ibuprofen (ADVIL,MOTRIN) 600 MG tablet Take 1  tablet (600 mg total) by mouth every 6 (six) hours. Patient not taking: Reported on 11/15/2018 09/11/17   Arlyce HarmanLockamy, Timothy, DO  ondansetron (ZOFRAN ODT) 4 MG disintegrating tablet Take 1 tablet (4 mg total) by mouth every 8 (eight) hours as needed for nausea or vomiting. 10/22/19   Antony MaduraHumes, Cady Hafen, PA-C  phenazopyridine (PYRIDIUM) 200 MG tablet Take 1 tablet (200 mg total) by mouth 3 (three) times daily as needed for pain. 05/27/19   Garlon HatchetSanders, Lisa M, PA-C  Prenat-FeAsp-Meth-FA-DHA w/o A (PRENATE PIXIE) 10-0.6-0.4-200 MG CAPS Take 1 tablet by mouth daily. Patient not taking: Reported on 11/15/2018 10/22/17   Adam PhenixArnold, James G, MD  Prenatal Vit-Fe Fumarate-FA (PRENATAL MULTIVITAMIN) TABS tablet Take 1 tablet by mouth daily at 12 noon.    [provider]    Family History Family History  Problem Relation Age of Onset  . Hyperlipidemia Mother   . Diabetes Father   . Heart disease Father   . Other Neg Hx     Social History Social History   Tobacco Use  . Smoking status: Former Smoker    Packs/day: 0.05    Types: Cigarettes  . Smokeless tobacco: Never Used  Substance Use Topics  . Alcohol use: No  .  Drug use: No     Allergies   Patient has no known allergies.   Review of Systems Review of Systems  Gastrointestinal: Positive for vomiting.  Ten systems reviewed and are negative for acute change, except as noted in the HPI.    Physical Exam Updated Vital Signs BP 108/81   Pulse 73   Temp 98.8 F (37.1 C) (Oral)   Resp 16   SpO2 100%   Physical Exam Vitals signs and nursing note reviewed.  Constitutional:      General: She is not in acute distress.    Appearance: She is well-developed. She is not diaphoretic.     Comments: Nontoxic-appearing and in no acute distress  HENT:     Head: Normocephalic and atraumatic.  Eyes:     General: No scleral icterus.    Conjunctiva/sclera: Conjunctivae normal.  Neck:     Musculoskeletal: Normal range of motion.  Pulmonary:      Effort: Pulmonary effort is normal. No respiratory distress.     Comments: Respirations even and unlabored Abdominal:     Palpations: Abdomen is soft.     Comments: Soft, nondistended abdomen with mild tenderness in the epigastrium.  Negative Murphy sign and no tenderness at McBurney's point.  No distention, masses, peritoneal signs.  Musculoskeletal: Normal range of motion.  Skin:    General: Skin is warm and dry.     Coloration: Skin is not pale.     Findings: No erythema or rash.  Neurological:     General: No focal deficit present.     Mental Status: She is alert and oriented to person, place, and time.     Coordination: Coordination normal.  Psychiatric:        Behavior: Behavior normal.      ED Treatments / Results  Labs (all labs ordered are listed, but only abnormal results are displayed) Labs Reviewed  COMPREHENSIVE METABOLIC PANEL - Abnormal; Notable for the following components:      Result Value   Potassium 3.4 (*)    Calcium 8.8 (*)    Total Protein 8.2 (*)    Total Bilirubin 1.3 (*)    All other components within normal limits  URINALYSIS, ROUTINE W REFLEX MICROSCOPIC - Abnormal; Notable for the following components:   APPearance HAZY (*)    Ketones, ur 20 (*)    Protein, ur 30 (*)    Leukocytes,Ua MODERATE (*)    All other components within normal limits  LIPASE, BLOOD  CBC  I-STAT BETA HCG BLOOD, ED ([facility], [facility], [facility] ONLY)    EKG None  Radiology US Abdomen Complete  Addendum Date: 10/22/2019   ADDENDUM REPORT: 10/22/2019 06:26 ADDENDUM: Study was originally ordered as a complete abdomen. Patient was brought back to complete the study. IVC: Within normal limits. Pancreas: Within normal limits. Spleen: Normal size and echotexture. Right kidney: 10.2 cm maximally, within normal limits. Normal renal parenchyma. No stone or mass lesion. No hydronephrosis. Left kidney: 10.6 cm maximally, within normal limits. Normal renal parenchyma. No stone or mass lesion. No  hydronephrosis. Abdominal aorta: Unremarkable.  No aneurysm. Impressions: Completion of abdominal ultrasound demonstrates normal sonographic appearance of the remainder of the abdomen. No acute or focal abnormality to explain the patient's abdominal pain. Electronically Signed   By: Marin Roberts M.D.   On: 10/22/2019 06:26   Result Date: 10/22/2019 CLINICAL DATA:  Abdominal pain.  Nausea and vomiting for 2 days. EXAM: ULTRASOUND ABDOMEN LIMITED RIGHT UPPER QUADRANT COMPARISON:  Renal ultrasound  06/08/2015 FINDINGS: Gallbladder: No gallstones or wall thickening visualized. No sonographic Murphy sign noted by sonographer. Common bile duct: Diameter: 2.1 mm, within normal limits Liver: No focal lesion identified. Within normal limits in parenchymal echogenicity. Portal vein is patent on color Doppler imaging with normal direction of blood flow towards the liver. Other: None. IMPRESSION: Negative right upper quadrant ultrasound. Electronically Signed: By: San Morelle M.D. On: 10/22/2019 05:10    Procedures Procedures (including critical care time)  Medications Ordered in ED Medications  sodium chloride flush (NS) 0.9 % injection 3 mL (has no administration in time range)  famotidine (PEPCID) IVPB 20 mg premix (0 mg Intravenous Stopped 10/22/19 0514)  sodium chloride 0.9 % bolus 1,000 mL (0 mLs Intravenous Stopped 10/22/19 0529)  ondansetron (ZOFRAN) injection 4 mg (4 mg Intravenous Given 10/22/19 0400)  ketorolac (TORADOL) 30 MG/ML injection 15 mg (15 mg Intravenous Given 10/22/19 0400)  metoCLOPramide (REGLAN) injection 10 mg (10 mg Intravenous Given 10/22/19 0538)  acetaminophen (TYLENOL) tablet 1,000 mg (1,000 mg Oral Given 10/22/19 0537)     Initial Impression / Assessment and Plan / ED Course  I have reviewed the triage vital signs and the nursing notes.  Pertinent labs & imaging results that were available during my care of the patient were reviewed by me and considered in my  medical decision making (see chart for details).        Patient with symptoms consistent with viral vs food-borne illness.  VSS and patient afebrile.  Lungs are clear.  No focal abdominal pain.  Doubt emergent abdominal etiology such as appendicitis, cholecystitis, pancreatitis, ruptured viscus, kidney stone, pSBO or SBO.  Her pregnancy is negative without UTI on urinalysis.  Abdominal US negative for acute process.    Patient reporting symptomatic improvement while in the emergency department.  She has been hydrated with IV fluids and given antiemetics.  Now tolerating PO without vomiting.  States that she is feeling better and is comfortable with discharge.  Prescription given for Zofran.  Encouraged follow-up with her primary care doctor.  Return precautions discussed and provided. Patient discharged in stable condition with no unaddressed concerns.   Final Clinical Impressions(s) / ED Diagnoses   Final diagnoses:  Non-intractable vomiting with nausea, unspecified vomiting type    ED Discharge Orders         Ordered    ondansetron (ZOFRAN ODT) 4 MG disintegrating tablet  Every 8 hours PRN     10/22/19 0628           Antonietta Breach, PA-C 10/22/19 0641    Ward, Delice Bison, DO 10/22/19 [phone]

## 2019-10-22 NOTE — Discharge Instructions (Addendum)
Avoid fried foods, fatty foods, greasy foods, and milk products until symptoms resolve. Drink plenty of clear liquids to prevent dehydration. We recommend the use of Zofran as prescribed for nausea/vomiting. Follow-up with your primary care doctor to ensure resolution of symptoms. 

## 2020-03-12 ENCOUNTER — Ambulatory Visit: Payer: Medicaid Other | Attending: Internal Medicine

## 2020-03-12 DIAGNOSIS — Z23 Encounter for immunization: Secondary | ICD-10-CM

## 2020-03-12 NOTE — Progress Notes (Signed)
   Covid-19 Vaccination Clinic  Name:  Cayden Rautio    MRN: [id number] DOB: January 10, 1986  03/12/2020  Ms. Zagal was observed post Covid-19 immunization for 15 minutes without incident. She was provided with Vaccine Information Sheet and instruction to access the V-Safe system.   Ms. Kun was instructed to call 911 with any severe reactions post vaccine: Marland Kitchen Difficulty breathing  . Swelling of face and throat  . A fast heartbeat  . A bad rash all over body  . Dizziness and weakness   Immunizations Administered    Name Date Dose VIS Date Route   Pfizer COVID-19 Vaccine 03/12/2020 12:46 PM 0.3 mL 01/13/2019 Intramuscular   Manufacturer: [employer], [employer]   Lot: [id number]   NDC: [address]-2

## 2020-04-05 IMAGING — US US ABDOMEN COMPLETE
2 series · 13 of 25 positions shown · non-contrast
Comparison: Renal ultrasound 06/08/2015
COMPARISON: Renal ultrasound 06/08/2015

Addendum:
CLINICAL DATA: Abdominal pain.  Nausea and vomiting for 2 days.

EXAM:
ULTRASOUND ABDOMEN LIMITED RIGHT UPPER QUADRANT

[Series 1: us abdomen complete · 7 of 27 slices shown (1 of 2)]
[im 1/27]
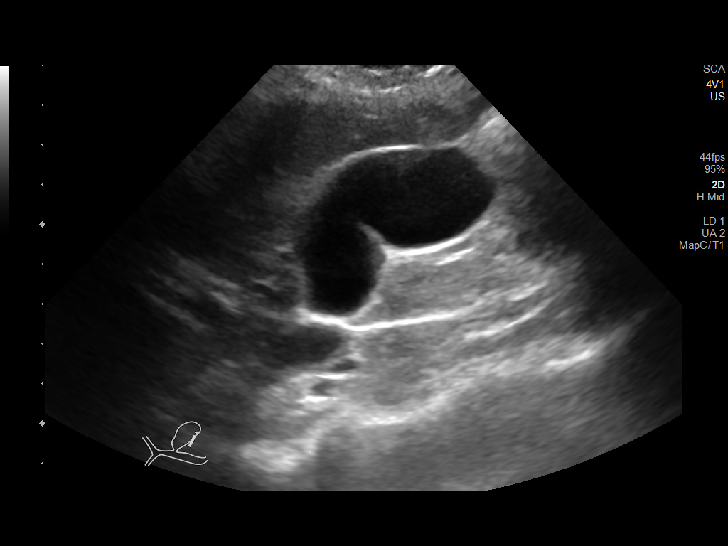
[im 5/27]
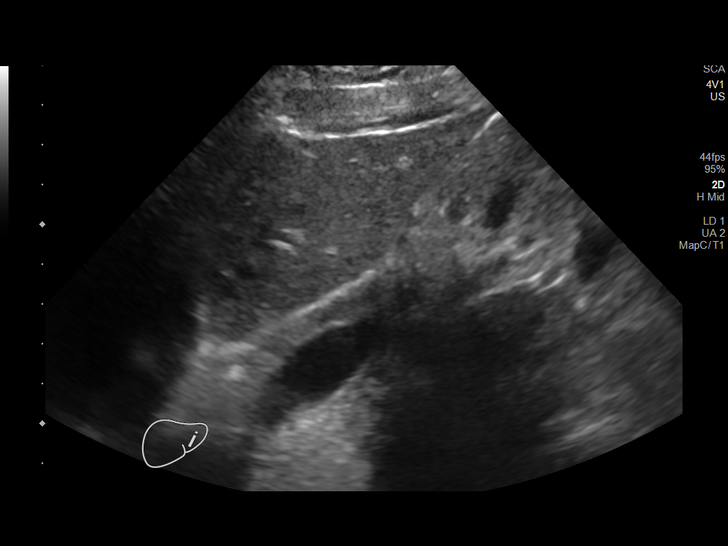
[im 9/27]
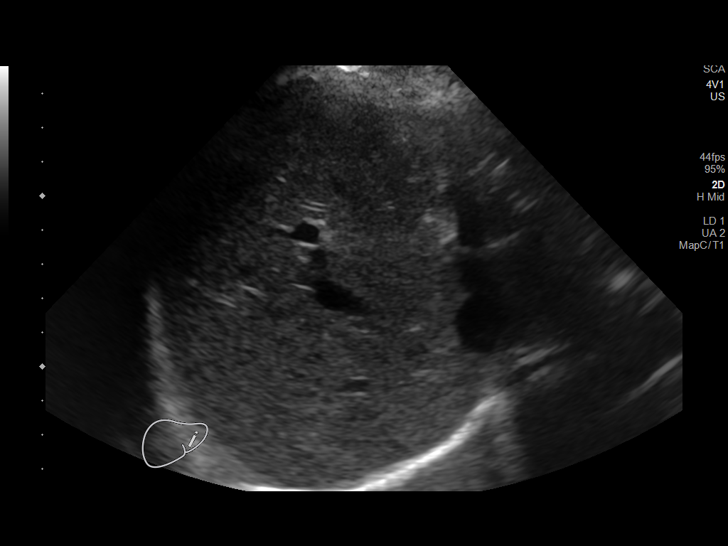
[im 14/27]
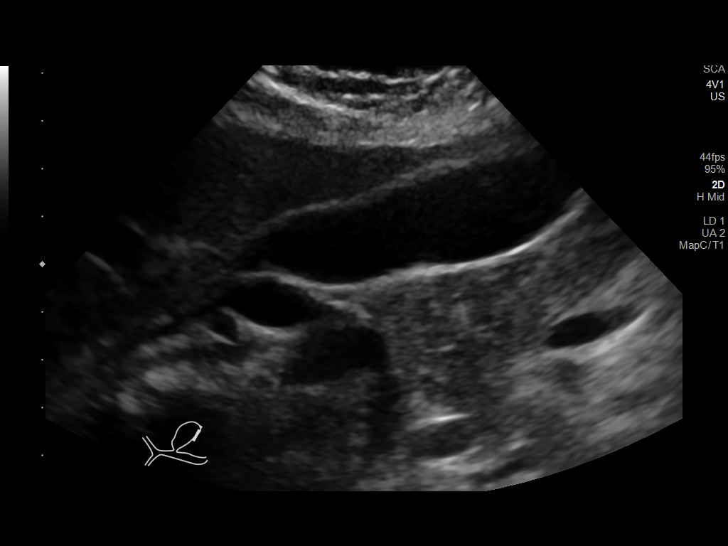
[im 18/27]
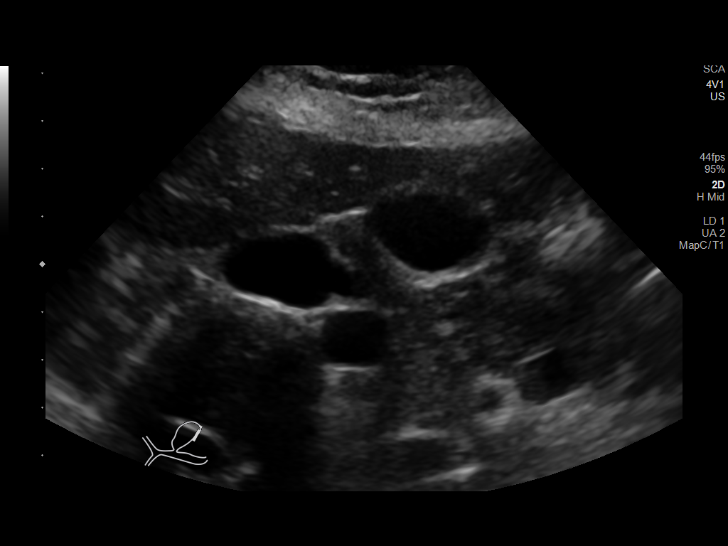
[im 22/27]
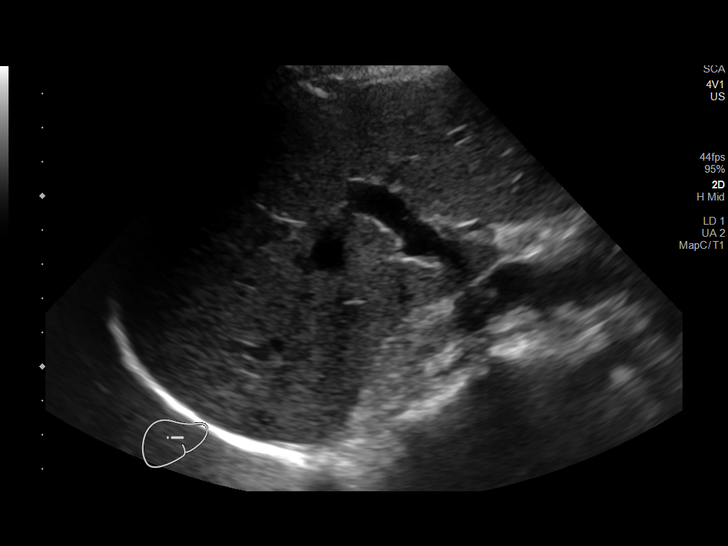
[im 27/27]
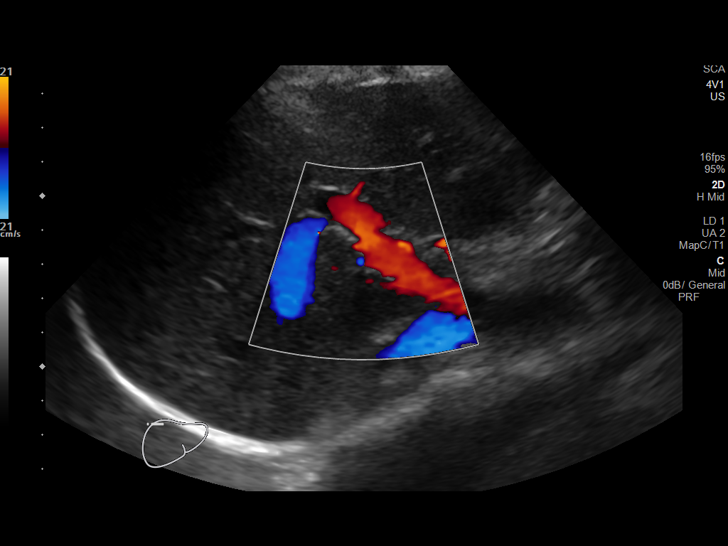

[Series 2: us abdomen complete · 24 acquisitions, 6 frames shown (2 of 2)]
[im 3/24]
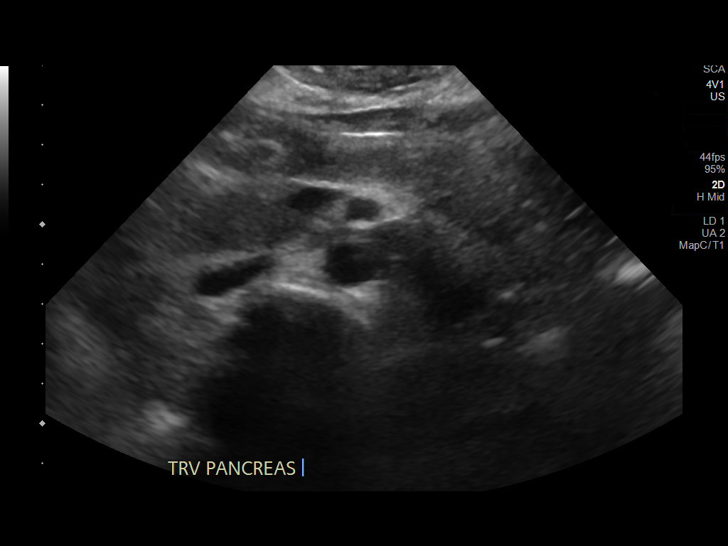
[im 7/24]
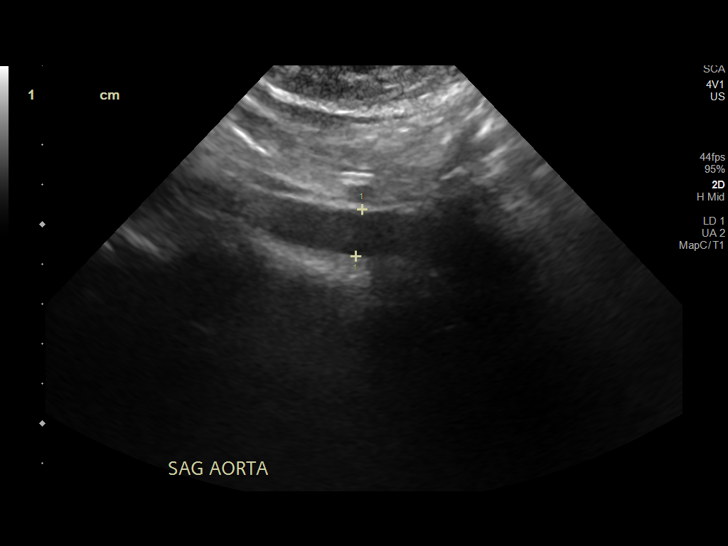
[im 11/24]
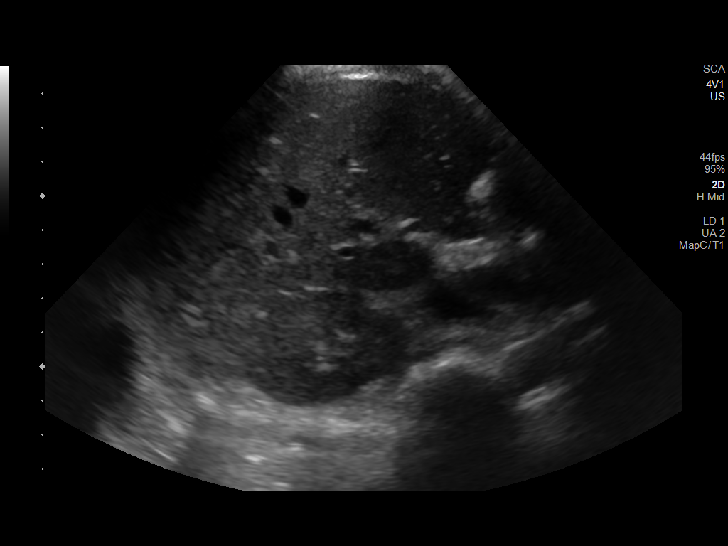
[im 15/24]
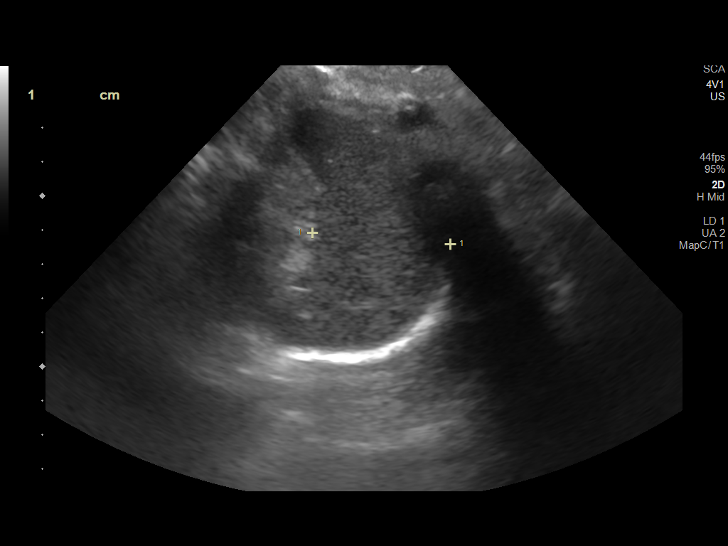
[im 19/24]
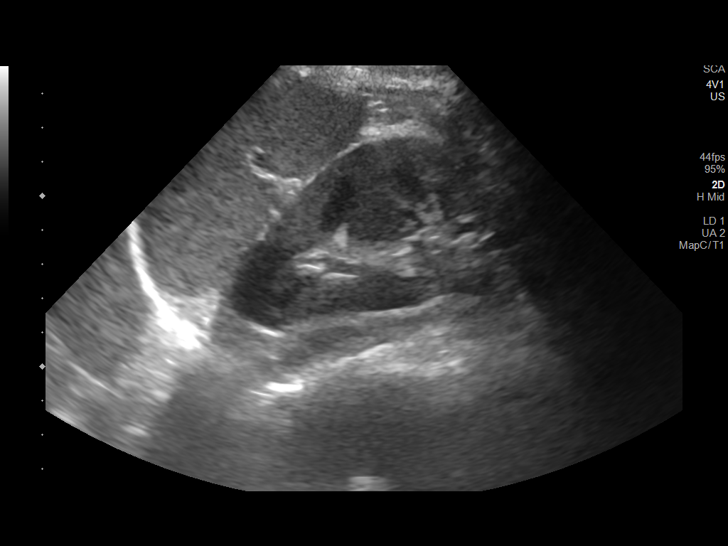
[im 24/24]
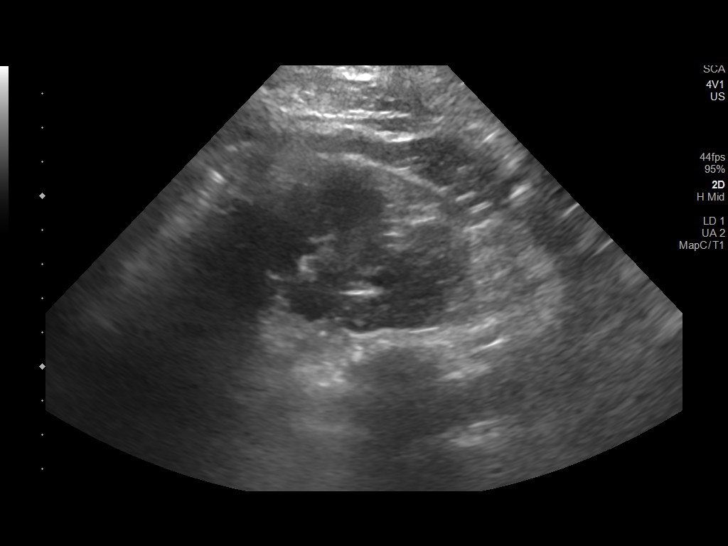

[13 of 25 positions shown; findings below may reference images not displayed]

FINDINGS: Gallbladder:

No gallstones or wall thickening visualized. No sonographic Murphy
sign noted by sonographer.

Common bile duct:

Diameter: 2.1 mm, within normal limits

Liver:

No focal lesion identified. Within normal limits in parenchymal
echogenicity. Portal vein is patent on color Doppler imaging with
normal direction of blood flow towards the liver.

Other: None.
IMPRESSION: Negative right upper quadrant ultrasound.

ADDENDUM:
Study was originally ordered as a complete abdomen. Patient was
brought back to complete the study.

IVC: Within normal limits.

Pancreas: Within normal limits.

Spleen: Normal size and echotexture.

Right kidney: 10.2 cm maximally, within normal limits. Normal renal
parenchyma. No stone or mass lesion. No hydronephrosis.

Left kidney: 10.6 cm maximally, within normal limits. Normal renal
parenchyma. No stone or mass lesion. No hydronephrosis.

Abdominal aorta: Unremarkable.  No aneurysm.

Impressions: Completion of abdominal ultrasound demonstrates normal
sonographic appearance of the remainder of the abdomen. No acute or
focal abnormality to explain the patient's abdominal pain.

*** End of Addendum ***
FINDINGS: Gallbladder:

No gallstones or wall thickening visualized. No sonographic Murphy
sign noted by sonographer.

Common bile duct:

Diameter: 2.1 mm, within normal limits

Liver:

No focal lesion identified. Within normal limits in parenchymal
echogenicity. Portal vein is patent on color Doppler imaging with
normal direction of blood flow towards the liver.

Other: None.
IMPRESSION: Negative right upper quadrant ultrasound.

## 2020-04-09 ENCOUNTER — Ambulatory Visit: Payer: Medicaid Other | Attending: Internal Medicine

## 2020-04-09 DIAGNOSIS — Z23 Encounter for immunization: Secondary | ICD-10-CM

## 2020-04-09 NOTE — Progress Notes (Signed)
   Covid-19 Vaccination Clinic  Name:  Katrina Blackwell    MRN: [id number] DOB: Jun 24, 1986  04/09/2020  Katrina Blackwell was observed post Covid-19 immunization for 15 minutes without incident. She was provided with Vaccine Information Sheet and instruction to access the V-Safe system.   Katrina Blackwell was instructed to call 911 with any severe reactions post vaccine: Marland Kitchen Difficulty breathing  . Swelling of face and throat  . A fast heartbeat  . A bad rash all over body  . Dizziness and weakness   Immunizations Administered    Name Date Dose VIS Date Route   Pfizer COVID-19 Vaccine 04/09/2020 12:48 PM 0.3 mL 01/13/2019 Intramuscular   Manufacturer: [employer], [employer]   Lot: [id number]   NDC: [address]-1

## 2020-05-19 DIAGNOSIS — Z419 Encounter for procedure for purposes other than remedying health state, unspecified: Secondary | ICD-10-CM | POA: Diagnosis not present

## 2020-06-19 DIAGNOSIS — Z419 Encounter for procedure for purposes other than remedying health state, unspecified: Secondary | ICD-10-CM | POA: Diagnosis not present

## 2020-07-20 DIAGNOSIS — Z419 Encounter for procedure for purposes other than remedying health state, unspecified: Secondary | ICD-10-CM | POA: Diagnosis not present

## 2020-08-19 DIAGNOSIS — Z419 Encounter for procedure for purposes other than remedying health state, unspecified: Secondary | ICD-10-CM | POA: Diagnosis not present

## 2020-09-19 DIAGNOSIS — Z419 Encounter for procedure for purposes other than remedying health state, unspecified: Secondary | ICD-10-CM | POA: Diagnosis not present

## 2020-10-19 DIAGNOSIS — Z419 Encounter for procedure for purposes other than remedying health state, unspecified: Secondary | ICD-10-CM | POA: Diagnosis not present

## 2020-11-08 ENCOUNTER — Emergency Department (HOSPITAL_COMMUNITY)
Admission: EM | Admit: 2020-11-08 | Discharge: 2020-11-08 | Discharge status: Against Medical Advice | Payer: Medicaid Other

## 2020-11-08 ENCOUNTER — Other Ambulatory Visit: Payer: Self-pay

## 2020-11-08 NOTE — ED Notes (Signed)
Called for 3rd time NO answer

## 2020-11-08 NOTE — ED Notes (Signed)
Called x 2 no answer

## 2020-11-19 DIAGNOSIS — Z419 Encounter for procedure for purposes other than remedying health state, unspecified: Secondary | ICD-10-CM | POA: Diagnosis not present

## 2020-12-20 DIAGNOSIS — Z419 Encounter for procedure for purposes other than remedying health state, unspecified: Secondary | ICD-10-CM | POA: Diagnosis not present

## 2021-01-17 DIAGNOSIS — Z419 Encounter for procedure for purposes other than remedying health state, unspecified: Secondary | ICD-10-CM | POA: Diagnosis not present

## 2021-02-17 DIAGNOSIS — Z419 Encounter for procedure for purposes other than remedying health state, unspecified: Secondary | ICD-10-CM | POA: Diagnosis not present

## 2021-03-19 DIAGNOSIS — Z419 Encounter for procedure for purposes other than remedying health state, unspecified: Secondary | ICD-10-CM | POA: Diagnosis not present

## 2021-04-19 DIAGNOSIS — Z419 Encounter for procedure for purposes other than remedying health state, unspecified: Secondary | ICD-10-CM | POA: Diagnosis not present

## 2021-05-11 DIAGNOSIS — F4323 Adjustment disorder with mixed anxiety and depressed mood: Secondary | ICD-10-CM | POA: Diagnosis not present

## 2021-05-18 DIAGNOSIS — F4323 Adjustment disorder with mixed anxiety and depressed mood: Secondary | ICD-10-CM | POA: Diagnosis not present

## 2021-05-19 DIAGNOSIS — Z419 Encounter for procedure for purposes other than remedying health state, unspecified: Secondary | ICD-10-CM | POA: Diagnosis not present

## 2021-06-19 DIAGNOSIS — Z419 Encounter for procedure for purposes other than remedying health state, unspecified: Secondary | ICD-10-CM | POA: Diagnosis not present

## 2021-06-26 DIAGNOSIS — F4323 Adjustment disorder with mixed anxiety and depressed mood: Secondary | ICD-10-CM | POA: Diagnosis not present

## 2021-07-20 DIAGNOSIS — Z419 Encounter for procedure for purposes other than remedying health state, unspecified: Secondary | ICD-10-CM | POA: Diagnosis not present

## 2021-08-19 DIAGNOSIS — Z419 Encounter for procedure for purposes other than remedying health state, unspecified: Secondary | ICD-10-CM | POA: Diagnosis not present

## 2021-09-19 DIAGNOSIS — Z419 Encounter for procedure for purposes other than remedying health state, unspecified: Secondary | ICD-10-CM | POA: Diagnosis not present

## 2021-10-19 DIAGNOSIS — Z419 Encounter for procedure for purposes other than remedying health state, unspecified: Secondary | ICD-10-CM | POA: Diagnosis not present

## 2021-11-19 DIAGNOSIS — Z419 Encounter for procedure for purposes other than remedying health state, unspecified: Secondary | ICD-10-CM | POA: Diagnosis not present

## 2021-12-01 ENCOUNTER — Other Ambulatory Visit: Payer: Self-pay | Admitting: Physician Assistant

## 2021-12-01 ENCOUNTER — Ambulatory Visit
Admission: RE | Admit: 2021-12-01 | Discharge: 2021-12-01 | Disposition: A | Discharge status: Home / Self Care | Payer: BC Managed Care – PPO | Source: Ambulatory Visit | Attending: Physician Assistant | Admitting: Physician Assistant

## 2021-12-01 ENCOUNTER — Ambulatory Visit
Admission: RE | Admit: 2021-12-01 | Discharge: 2021-12-01 | Disposition: A | Discharge status: Home / Self Care | Payer: Medicaid Other | Source: Ambulatory Visit | Attending: Physician Assistant | Admitting: Physician Assistant

## 2021-12-01 ENCOUNTER — Other Ambulatory Visit: Payer: Self-pay

## 2021-12-01 DIAGNOSIS — R1031 Right lower quadrant pain: Secondary | ICD-10-CM

## 2021-12-01 MED ORDER — IOPAMIDOL (ISOVUE-300) INJECTION 61%
100.0000 mL | Freq: Once | INTRAVENOUS | Status: AC | PRN
  Administered 2021-12-01: 100 mL via INTRAVENOUS

## 2021-12-20 DIAGNOSIS — Z419 Encounter for procedure for purposes other than remedying health state, unspecified: Secondary | ICD-10-CM | POA: Diagnosis not present

## 2021-12-27 ENCOUNTER — Other Ambulatory Visit: Payer: Self-pay | Admitting: Obstetrics and Gynecology

## 2021-12-27 DIAGNOSIS — R102 Pelvic and perineal pain: Secondary | ICD-10-CM

## 2022-01-01 ENCOUNTER — Ambulatory Visit
Admission: RE | Admit: 2022-01-01 | Discharge: 2022-01-01 | Disposition: A | Discharge status: Home / Self Care | Payer: Medicaid Other | Source: Ambulatory Visit | Attending: Obstetrics and Gynecology | Admitting: Obstetrics and Gynecology

## 2022-01-01 ENCOUNTER — Other Ambulatory Visit (HOSPITAL_COMMUNITY): Payer: Self-pay | Admitting: Interventional Radiology

## 2022-01-01 ENCOUNTER — Encounter: Payer: Self-pay | Admitting: *Deleted

## 2022-01-01 DIAGNOSIS — R102 Pelvic and perineal pain: Secondary | ICD-10-CM

## 2022-01-01 DIAGNOSIS — N9489 Other specified conditions associated with female genital organs and menstrual cycle: Secondary | ICD-10-CM

## 2022-01-01 HISTORY — PX: IR RADIOLOGIST EVAL & MGMT: IMG5224

## 2022-01-01 NOTE — Consult Note (Signed)
Chief Complaint: Patient was seen in consultation today for possible pelvic congestion syndrome at the request of Roberts,Angela  Referring Physician(s): Roberts,Angela  History of Present Illness: Katrina Blackwell is a G2, P2 36 y.o. female who presented 12/01/2021 with acute right lower quadrant abdominal pain and CT   demonstrated malpositioned IUD.  The CT also demonstrated enlarged refluxing right ovarian vein with distended parametrial venous plexus left worse than right. 12/04/2021 the malpositioned IUD was subsequently removed. She has persistent pelvic cramping type pain mainly at night.  She rates it as 10 out of 10 on the visual analog pain scale.  Pain radiates to the right thigh.  He has bilateral pelvic tenderness.  She is using hydrocodone Nightly for pain control which allows her to sleep. No external venous varices.  Denies dyspareunia.  Currently being treated for bacterial vaginosis.  No urinary or fecal incontinence.  No fever or chills.  No GI symptoms.  Past Medical History:  Diagnosis Date   Anxiety    Headache(784.0)    MIGRAINES;OTC    Past Surgical History:  Procedure Laterality Date   TONSILLECTOMY  2009    Allergies: Patient has no known allergies.  Medications: Prior to Admission medications   Medication Sig Start Date End Date Taking? Authorizing Provider  acetaminophen (TYLENOL) 500 MG tablet Take 500 mg by mouth every 6 (six) hours as needed for moderate pain or headache.    [provider]  benzonatate (TESSALON) 200 MG capsule Take 1 capsule (200 mg total) by mouth 2 (two) times daily as needed for cough. 01/28/19   Jeannie Fend, PA-C  ibuprofen (ADVIL,MOTRIN) 600 MG tablet Take 1 tablet (600 mg total) by mouth every 6 (six) hours. Patient not taking: Reported on 11/15/2018 09/11/17   Arlyce Harman, MD  ondansetron (ZOFRAN ODT) 4 MG disintegrating tablet Take 1 tablet (4 mg total) by mouth every 8 (eight) hours as needed for nausea  or vomiting. 10/22/19   Antony Madura, PA-C  phenazopyridine (PYRIDIUM) 200 MG tablet Take 1 tablet (200 mg total) by mouth 3 (three) times daily as needed for pain. 05/27/19   Garlon Hatchet, PA-C  Prenat-FeAsp-Meth-FA-DHA w/o A (PRENATE PIXIE) 10-0.6-0.4-200 MG CAPS Take 1 tablet by mouth daily. Patient not taking: Reported on 11/15/2018 10/22/17   Adam Phenix, MD  Prenatal Vit-Fe Fumarate-FA (PRENATAL MULTIVITAMIN) TABS tablet Take 1 tablet by mouth daily at 12 noon.    [provider]    Norethindrone acetate 5 mg p.o.  Diflucan 150 mg p.o.  Tinidazone 500 mg p.o.  Vicodin 5/325 nightly  She is not taking any anticoagulants   Family History  Problem Relation Age of Onset   Hyperlipidemia Mother    Diabetes Father    Heart disease Father    Other Neg Hx     Social History   Socioeconomic History   Marital status: Married    Spouse name: [name]   Number of children: Not on file   Years of education: 16   Highest education level: Not on file  Occupational History   Occupation: HOMEMAKER-STUDENT  Tobacco Use   Smoking status: Former    Packs/day: 0.05    Types: Cigarettes   Smokeless tobacco: Never  Vaping Use   Vaping Use: Never used  Substance and Sexual Activity   Alcohol use: No   Drug use: No   Sexual activity: Yes    Partners: Male    Birth control/protection: I.U.D.  Other Topics Concern  Not on file  Social History Narrative   Not on file   Social Determinants of Health   Financial Resource Strain: Not on file  Food Insecurity: Not on file  Transportation Needs: Not on file  Physical Activity: Not on file  Stress: Not on file  Social Connections: Not on file   She works as an outpatient x-ray tech.  ECOG Status: 1 - Symptomatic but completely ambulatory  Review of Systems: A 12 point ROS discussed and pertinent positives are indicated in the HPI above.  All other systems are negative.  Review of Systems  Vital Signs: BP  116/83 (BP Location: Left Arm)    Pulse 90    SpO2 90%   Physical Exam  Constitutional: Oriented to person, place, and time. Well-developed and well-nourished. No distress.   HENT:  Head: Normocephalic and atraumatic.  Eyes: Conjunctivae and EOM are normal. Right eye exhibits no discharge. Left eye exhibits no discharge. No scleral icterus.  Neck: No JVD present.  Pulmonary/Chest: Effort normal. No stridor. No respiratory distress.  Abdomen: soft, non distended Neurological:  alert and oriented to person, place, and time.  Skin: Skin is warm and dry.  not diaphoretic.  Psychiatric:   normal mood and affect.   behavior is normal. Judgment and thought content normal.       Imaging: CT ABDOMEN AND PELVIS WITH CONTRAST   TECHNIQUE: Multidetector CT imaging of the abdomen and pelvis was performed using the standard protocol following bolus administration of intravenous contrast.   RADIATION DOSE REDUCTION: This exam was performed according to the departmental dose-optimization program which includes automated exposure control, adjustment of the mA and/or kV according to patient size and/or use of iterative reconstruction technique.   CONTRAST:  ISOVUE-300 IOPAMIDOL (ISOVUE-300) INJECTION 61%   COMPARISON:  None.   FINDINGS: Lower chest: No acute abnormality.   Hepatobiliary: Mild hepatic steatosis. No enhancing intrahepatic mass. No intra or extrahepatic biliary ductal dilation. Gallbladder unremarkable.   Pancreas: Unremarkable   Spleen: Unremarkable   Adrenals/Urinary Tract: Adrenal glands are unremarkable. Kidneys are normal, without renal calculi, focal lesion, or hydronephrosis. Bladder is unremarkable.   Stomach/Bowel: Stomach is within normal limits. Appendix appears normal. No evidence of bowel wall thickening, distention, or inflammatory changes.   Vascular/Lymphatic: The ovarian veins bilaterally are dilated and there are numerous adnexal varices  suggesting changes of ovarian vein reflux and pelvic venous insufficiency. The abdominal vasculature is otherwise unremarkable. No pathologic adenopathy within the abdomen and pelvis.   Reproductive: Intrauterine device has penetrated the myometrium in appears to perforate the left uterine fundus, best seen on axial image # 65 and coronal image # 63. No periuterine fluid collections identified. Corpus luteum within the right ovary. The pelvic organs are otherwise unremarkable.   Other: No abdominal wall hernia or abnormality. No abdominopelvic ascites.   Musculoskeletal: No acute bone abnormality. No lytic or blastic bone lesions.   IMPRESSION: Malpositioned intrauterine device demonstrating perforation of the left uterine fundus. No periuterine fluid collection or free intraperitoneal fluid identified.   Normal appendix.   Mild hepatic steatosis.   Dilation of the ovarian veins and adnexal varices suggesting changes of ovarian vein reflux and pelvic venous insufficiency. Correlation with clinical examination is recommended as this may be amenable to interventional therapy.     Electronically Signed   By: Helyn Numbers M.D.   On: 12/01/2021 16:20  Labs:  CBC: No results for input(s): WBC, HGB, HCT, PLT in the last 8760 hours.  COAGS:  No results for input(s): INR, APTT in the last 8760 hours.  BMP: No results for input(s): NA, K, CL, CO2, GLUCOSE, BUN, CALCIUM, CREATININE, GFRNONAA, GFRAA in the last 8760 hours.  Invalid input(s): CMP  LIVER FUNCTION TESTS: No results for input(s): BILITOT, AST, ALT, ALKPHOS, PROT, ALBUMIN in the last 8760 hours.  TUMOR MARKERS: No results for input(s): AFPTM, CEA, CA199, CHROMGRNA in the last 8760 hours.  Assessment and Plan: My   impression is that this patient is having persistent severe pelvic pain even after the removal of the malpositioned IUD.  Her recent CT clearly shows a dilated and refluxing left ovarian vein  supplying distended parametrial venous plexus, with the drainage of the patent right ovarian vein.  Anatomic findings suggest pelvic congestion syndrome which may account for her symptoms.  We reviewed her CT findings together.  We discussed left ovarian vein embolization technique details, anticipated benefits, possible risks and complications, and alternatives.  I think this would give her a good chance of significant improvement in her symptoms, at low risk.  She is motivated to proceed.  Accordingly, we can set this up as an outpatient at [facility] long or [facility] with moderate sedation at her convenience.  Thank you for this interesting consult.  I greatly enjoyed meeting Jayda Gildon and look forward to participating in their care.  A copy of this report was sent to the requesting provider on this date.  Electronically Signed: Durwin Glaze 01/01/2022, 11:47 AM   I spent a total of  30 Minutes   in face to face in clinical consultation, greater than 50% of which was counseling/coordinating care for possible pelvic congestion syndrome secondary to dilated refluxing left ovarian vein.

## 2022-01-17 DIAGNOSIS — Z419 Encounter for procedure for purposes other than remedying health state, unspecified: Secondary | ICD-10-CM | POA: Diagnosis not present

## 2022-02-07 ENCOUNTER — Other Ambulatory Visit: Payer: Self-pay | Admitting: Radiology

## 2022-02-07 ENCOUNTER — Other Ambulatory Visit (HOSPITAL_COMMUNITY): Payer: Self-pay | Admitting: Interventional Radiology

## 2022-02-07 ENCOUNTER — Ambulatory Visit (HOSPITAL_COMMUNITY)
Admission: RE | Admit: 2022-02-07 | Discharge: 2022-02-07 | Disposition: A | Discharge status: Home / Self Care | Payer: BC Managed Care – PPO | Source: Ambulatory Visit | Attending: Interventional Radiology | Admitting: Interventional Radiology

## 2022-02-07 ENCOUNTER — Other Ambulatory Visit: Payer: Self-pay | Admitting: Student

## 2022-02-07 ENCOUNTER — Other Ambulatory Visit: Payer: Self-pay

## 2022-02-07 DIAGNOSIS — N9489 Other specified conditions associated with female genital organs and menstrual cycle: Secondary | ICD-10-CM

## 2022-02-07 DIAGNOSIS — Y848 Other medical procedures as the cause of abnormal reaction of the patient, or of later complication, without mention of misadventure at the time of the procedure: Secondary | ICD-10-CM | POA: Diagnosis not present

## 2022-02-07 DIAGNOSIS — T8332XA Displacement of intrauterine contraceptive device, initial encounter: Secondary | ICD-10-CM | POA: Diagnosis not present

## 2022-02-07 DIAGNOSIS — Z87891 Personal history of nicotine dependence: Secondary | ICD-10-CM | POA: Insufficient documentation

## 2022-02-07 HISTORY — PX: IR VENOGRAM RENAL UNI LEFT: IMG680

## 2022-02-07 HISTORY — PX: IR EMBO VENOUS NOT HEMORR HEMANG  INC GUIDE ROADMAPPING: IMG5447

## 2022-02-07 HISTORY — PX: IR US GUIDE VASC ACCESS RIGHT: IMG2390

## 2022-02-07 LAB — BASIC METABOLIC PANEL
Anion gap: 9 (ref 5–15)
BUN: 11 mg/dL (ref 6–20)
CO2: 25 mmol/L (ref 22–32)
Calcium: 9.2 mg/dL (ref 8.9–10.3)
Chloride: 102 mmol/L (ref 98–111)
Creatinine, Ser: 0.65 mg/dL (ref 0.44–1.00)
GFR, Estimated: >60 mL/min (ref >60)
Glucose, Bld: 99 mg/dL (ref 70–99)
Potassium: 3.8 mmol/L (ref 3.5–5.1)
Sodium: 136 mmol/L (ref 135–145)

## 2022-02-07 LAB — CBC
HCT: 39.7 % (ref 36.0–46.0)
Hemoglobin: 13.1 g/dL (ref 12.0–15.0)
MCH: 28.8 pg (ref 26.0–34.0)
MCHC: 33 g/dL (ref 30.0–36.0)
MCV: 87.3 fL (ref 80.0–100.0)
Platelets: 254 10*3/uL (ref 150–400)
RBC: 4.55 MIL/uL (ref 3.87–5.11)
RDW: 12.6 % (ref 11.5–15.5)
WBC: 5.5 10*3/uL (ref 4.0–10.5)
nRBC: 0 % (ref 0.0–0.2)

## 2022-02-07 LAB — PREGNANCY, URINE: Preg Test, Ur: NEGATIVE

## 2022-02-07 LAB — PROTIME-INR
INR: 1 (ref 0.8–1.2)
Prothrombin Time: 13.3 seconds (ref 11.4–15.2)

## 2022-02-07 MED ORDER — MIDAZOLAM HCL 2 MG/2ML IJ SOLN
INTRAMUSCULAR | Status: AC | PRN
Start: 2022-02-07 — End: 2022-02-07
  Administered 2022-02-07 (×3): .5 mg via INTRAVENOUS
  Administered 2022-02-07: 1 mg via INTRAVENOUS

## 2022-02-07 MED ORDER — FENTANYL CITRATE (PF) 100 MCG/2ML IJ SOLN
INTRAMUSCULAR | Status: AC
  Filled 2022-02-07: qty 4

## 2022-02-07 MED ORDER — LIDOCAINE HCL 1 % IJ SOLN
INTRAMUSCULAR | Status: AC
  Administered 2022-02-07: 10 mL
  Filled 2022-02-07: qty 20

## 2022-02-07 MED ORDER — IOHEXOL 300 MG/ML  SOLN
100.0000 mL | Freq: Once | INTRAMUSCULAR | Status: AC | PRN
  Administered 2022-02-07: 36 mL via INTRAVENOUS

## 2022-02-07 MED ORDER — SODIUM CHLORIDE 0.9 % IV SOLN
INTRAVENOUS | Status: DC
Start: 2022-02-07 — End: 2022-02-08

## 2022-02-07 MED ORDER — FENTANYL CITRATE (PF) 100 MCG/2ML IJ SOLN
INTRAMUSCULAR | Status: AC | PRN
  Administered 2022-02-07 (×4): 25 ug via INTRAVENOUS

## 2022-02-07 MED ORDER — HYDROCODONE-ACETAMINOPHEN 5-325 MG PO TABS: 1.0000 | ORAL_TABLET | ORAL | Status: DC | PRN

## 2022-02-07 MED ORDER — MIDAZOLAM HCL 2 MG/2ML IJ SOLN
INTRAMUSCULAR | Status: AC
  Filled 2022-02-07: qty 4

## 2022-02-07 MED ORDER — SODIUM CHLORIDE 0.9 % IV SOLN
INTRAVENOUS | Status: AC | PRN
  Administered 2022-02-07: 10 mL/h via INTRAVENOUS

## 2022-02-07 NOTE — Discharge Instructions (Signed)
Femoral Site Care ?This sheet gives you information about how to care for yourself after your procedure. Your health care provider may also give you more specific instructions. If you have problems or questions, contact your health care provider. ?What can I expect after the procedure? ? ?After the procedure, it is common to have: ?Bruising that usually fades within 1-2 weeks. ?Tenderness at the site. ?Follow these instructions at home: ?Wound care ?Follow instructions from your health care provider about how to take care of your insertion site. Make sure you: ?Wash your hands with soap and water before you change your bandage (dressing). If soap and water are not available, use hand sanitizer. ?Remove your dressing as told by your health care provider. In 24 hours ?Do not take baths, swim, or use a hot tub until your health care provider approves. ?You may shower 24-48 hours after the procedure or as told by your health care provider. ?Gently wash the site with plain soap and water. ?Pat the area dry with a clean towel. ?Do not rub the site. This may cause bleeding. ?Do not apply powder or lotion to the site. Keep the site clean and dry. ?Check your femoral site every day for signs of infection. Check for: ?Redness, swelling, or pain. ?Fluid or blood. ?Warmth. ?Pus or a bad smell. ?Activity ?For the first 2 days after your procedure, or as long as directed: ?Avoid climbing stairs as much as possible. ?Do not squat. ?Do not lift anything that is heavier than 10 lb (4.5 kg), or the limit that you are told, until your health care provider says that it is safe. For 2 days ?Rest as directed. ?Avoid sitting for a long time without moving. Get up to take short walks every 1-2 hours. ?Do not drive for 24 hours if you were given a medicine to help you relax (sedative). ?General instructions ?Take over-the-counter and prescription medicines only as told by your health care provider. ?Keep all follow-up visits as told by your  health care provider. This is important. ?Contact a health care provider if you have: ?A fever or chills. ?You have redness, swelling, or pain around your insertion site. ?These symptoms may represent a serious problem that is an emergency. Do not wait to see if the symptoms will go away. Get medical help right away. Call your local emergency services (911 in the [place] Do not drive yourself to the hospital. ?Summary ?After the procedure, it is common to have bruising that usually fades within 1-2 weeks. ?Check your femoral site every day for signs of infection. ?Do not lift anything that is heavier than 10 lb (4.5 kg), or the limit that you are told, until your health care provider says that it is safe. ?This information is not intended to replace advice given to you by your health care provider. Make sure you discuss any questions you have with your health care provider. ?Document Revised: 11/18/2017 Document Reviewed: 11/18/2017 ?Elsevier Patient Education ? 2020 Else[employer] ? ?

## 2022-02-07 NOTE — Sedation Documentation (Addendum)
Intermittant Left lower abdominal pain during procedure but then falls asleep. ?

## 2022-02-07 NOTE — Procedures (Signed)
?  Procedure:  Left ovarian vein embolization   ?Preprocedure diagnosis: The encounter diagnosis was Pelvic congestion syndrome. ? ?Postprocedure diagnosis: same ?EBL:    minimal ?Complications:   none immediate ? ?See full dictation in [facility]. ? ?D. Arne Cleveland MD ?Main # [phone] ?Pager  [phone] ?Mobile [phone] ?  ? ?

## 2022-02-07 NOTE — Sedation Documentation (Signed)
Complaining of some pain in abdomen LLQ ?

## 2022-02-07 NOTE — H&P (Signed)
? ?Chief Complaint: ?Patient was seen in consultation today for pelvic congestion syndrome ? ?Referring Physician(s): Dr. Everett Graff ? ?Supervising Physician: Arne Cleveland ? ?Patient Status: [facility] - Out-pt ? ?History of Present Illness: ?Katrina Blackwell is a 36 y.o. female with a medical history significant for anxiety and migraines. She was evaluated by her PCP in early January for acute right lower quadrant pain and CT imaging demonstrated a malpositioned IUD and findings suggestive of pelvic congestion syndrome. Her IUD was removed.  ? ?CT Abdomen/Pelvis with contrast 12/01/21 ?IMPRESSION: ?1. Malpositioned intrauterine device demonstrating perforation of the ?left uterine fundus. No periuterine fluid collection or free ?intraperitoneal fluid identified. ?2. Normal appendix. ?3. Mild hepatic steatosis. ?4. Dilation of the ovarian veins and adnexal varices suggesting changes ?of ovarian vein reflux and pelvic venous insufficiency. Correlation ?with clinical examination is recommended as this may be amenable to ?interventional therapy. ? ?She was referred to Interventional Radiology to discuss management options and she met with Dr. Vernard Gambles 01/01/22. She endorsed persistent, 10/10 pelvic cramping pain, mostly at night, with pain radiating to the right thigh. She also endorsed bilateral pelvic tenderness and the need to use hydrocodone nightly for pain control. Dr. Vernard Gambles discussed that her image findings and symptoms are consistent with pelvic congestion syndrome and a pelvic angiogram with possible embolization was recommended to improve her symptoms. The patient was in agreement to proceed.   ? ? ?Past Medical History:  ?Diagnosis Date  ? Anxiety   ? Headache(784.0)   ? MIGRAINES;OTC  ? ? ?Past Surgical History:  ?Procedure Laterality Date  ? IR RADIOLOGIST EVAL & MGMT  01/01/2022  ? TONSILLECTOMY  2009  ? ? ?Allergies: ?Patient has no known allergies. ? ?Medications: ?Prior to Admission medications    ?Medication Sig Start Date End Date Taking? Authorizing Provider  ?acetaminophen (TYLENOL) 500 MG tablet Take 500 mg by mouth every 6 (six) hours as needed for moderate pain or headache.   Yes [provider]  ?benzonatate (TESSALON) 200 MG capsule Take 1 capsule (200 mg total) by mouth 2 (two) times daily as needed for cough. 01/28/19  Yes Tacy Learn, PA-C  ?ondansetron (ZOFRAN ODT) 4 MG disintegrating tablet Take 1 tablet (4 mg total) by mouth every 8 (eight) hours as needed for nausea or vomiting. 10/22/19  Yes Antonietta Breach, PA-C  ?phenazopyridine (PYRIDIUM) 200 MG tablet Take 1 tablet (200 mg total) by mouth 3 (three) times daily as needed for pain. 05/27/19  Yes Larene Pickett, PA-C  ?Rimegepant Sulfate (NURTEC) 75 MG TBDP Take by mouth.   Yes [provider]  ?ibuprofen (ADVIL,MOTRIN) 600 MG tablet Take 1 tablet (600 mg total) by mouth every 6 (six) hours. ?Patient not taking: Reported on 11/15/2018 09/11/17   Nuala Alpha, MD  ?Prenat-FeAsp-Meth-FA-DHA w/o A (PRENATE PIXIE) 10-0.6-0.4-200 MG CAPS Take 1 tablet by mouth daily. ?Patient not taking: Reported on 11/15/2018 10/22/17   Woodroe Mode, MD  ?Prenatal Vit-Fe Fumarate-FA (PRENATAL MULTIVITAMIN) TABS tablet Take 1 tablet by mouth daily at 12 noon.    [provider]  ?  ? ?Family History  ?Problem Relation Age of Onset  ? Hyperlipidemia Mother   ? Diabetes Father   ? Heart disease Father   ? Other Neg Hx   ? ? ?Social History  ? ?Socioeconomic History  ? Marital status: Married  ?  Spouse name: [name]  ? Number of children: Not on file  ? Years of education: 38  ? Highest education level: Not  on file  ?Occupational History  ? Occupation: HOMEMAKER-STUDENT  ?Tobacco Use  ? Smoking status: Former  ?  Packs/day: 0.05  ?  Types: Cigarettes  ? Smokeless tobacco: Never  ?Vaping Use  ? Vaping Use: Never used  ?Substance and Sexual Activity  ? Alcohol use: No  ? Drug use: No  ? Sexual activity: Yes  ?  Partners: Male  ?   Birth control/protection: I.U.D.  ?Other Topics Concern  ? Not on file  ?Social History Narrative  ? Not on file  ? ?Social Determinants of Health  ? ?Financial Resource Strain: Not on file  ?Food Insecurity: Not on file  ?Transportation Needs: Not on file  ?Physical Activity: Not on file  ?Stress: Not on file  ?Social Connections: Not on file  ? ? ?Review of Systems: A 12 point ROS discussed and pertinent positives are indicated in the HPI above.  All other systems are negative. ? ?Review of Systems  ?Constitutional:  Negative for appetite change and fatigue.  ?Respiratory:  Negative for cough and shortness of breath.   ?Cardiovascular:  Negative for chest pain and leg swelling.  ?Gastrointestinal:  Positive for abdominal pain. Negative for diarrhea, nausea and vomiting.  ?Genitourinary:  Positive for pelvic pain.  ?Neurological:  Positive for headaches. Negative for dizziness.  ? ?Vital Signs: ?BP 124/87   Pulse 82   Temp 98.3 ?F (36.8 ?C) (Oral)   Resp 18   Ht '5\' 6"'  (1.676 m)   Wt 146 lb (66.2 kg)   SpO2 100%   BMI 23.57 kg/m?  ? ?Physical Exam ?Constitutional:   ?   General: She is not in acute distress. ?   Appearance: She is not ill-appearing.  ?HENT:  ?   Mouth/Throat:  ?   Mouth: Mucous membranes are moist.  ?   Pharynx: Oropharynx is clear.  ?Cardiovascular:  ?   Rate and Rhythm: Normal rate and regular rhythm.  ?   Pulses: Normal pulses.  ?   Heart sounds: Normal heart sounds.  ?Pulmonary:  ?   Effort: Pulmonary effort is normal.  ?   Breath sounds: Normal breath sounds.  ?Abdominal:  ?   General: Bowel sounds are normal.  ?   Palpations: Abdomen is soft.  ?Genitourinary: ?   Comments: LMP 01/29/22 ?Musculoskeletal:  ?   Right lower leg: No edema.  ?   Left lower leg: No edema.  ?Skin: ?   General: Skin is warm and dry.  ?Neurological:  ?   Mental Status: She is alert and oriented to person, place, and time.  ? ? ?Imaging: ?No results found. ? ?Labs: ? ?CBC: ?Recent Labs  ?  02/07/22 ?0811  ?WBC 5.5   ?HGB 13.1  ?HCT 39.7  ?PLT 254  ? ? ?COAGS: ?Recent Labs  ?  02/07/22 ?0811  ?INR 1.0  ? ? ?BMP: ?Recent Labs  ?  02/07/22 ?0811  ?NA 136  ?K 3.8  ?CL 102  ?CO2 25  ?GLUCOSE 99  ?BUN 11  ?CALCIUM 9.2  ?CREATININE 0.65  ?GFRNONAA >60  ? ? ?LIVER FUNCTION TESTS: ?No results for input(s): BILITOT, AST, ALT, ALKPHOS, PROT, ALBUMIN in the last 8760 hours. ? ?TUMOR MARKERS: ?No results for input(s): AFPTM, CEA, CA199, CHROMGRNA in the last 8760 hours. ? ?Assessment and Plan: ? ?Pelvic congestion syndrome: Georgena Spurling, 36 year old female, presents today to the [facility] Interventional Radiology department for an image-guided pelvic angiogram with possible intervention, possible embolization. ? ?Risks and benefits of this procedure  were discussed with the patient including, but not limited to bleeding, infection, vascular injury or contrast induced renal failure. ? ?This interventional procedure involves the use of X-rays and because of the nature of the planned procedure, it is possible that we will have prolonged use of X-ray fluoroscopy. ? ?Potential radiation risks to you include (but are not limited to) the following: ?- A slightly elevated risk for cancer  several years later in life. This risk is typically less ?than 0.5% percent. This risk is low in comparison to the normal incidence of human ?cancer, which is 33% for women and 50% for men according to the American Cancer ?Society. ?- Radiation induced injury can include skin redness, resembling a rash, tissue breakdown / ulcers and hair loss (which can be temporary or permanent).  ? ?The likelihood of either of these occurring depends on the difficulty of the procedure and whether you are sensitive to radiation due to previous procedures, disease, or genetic conditions.  ? ?IF your procedure requires a prolonged use of radiation, you will be notified and given written instructions for further action.  It is your responsibility to monitor the irradiated area for  the 2 weeks following the procedure and to notify your physician if you are concerned that you have suffered a radiation induced injury.   ? ?All of the patient's questions were answered, patient is agreeable to proceed

## 2022-02-17 DIAGNOSIS — Z419 Encounter for procedure for purposes other than remedying health state, unspecified: Secondary | ICD-10-CM | POA: Diagnosis not present

## 2022-02-23 ENCOUNTER — Other Ambulatory Visit: Payer: Self-pay | Admitting: Interventional Radiology

## 2022-02-23 DIAGNOSIS — N9489 Other specified conditions associated with female genital organs and menstrual cycle: Secondary | ICD-10-CM

## 2022-02-27 ENCOUNTER — Other Ambulatory Visit: Payer: Self-pay | Admitting: Physician Assistant

## 2022-02-27 DIAGNOSIS — R600 Localized edema: Secondary | ICD-10-CM

## 2022-03-02 ENCOUNTER — Other Ambulatory Visit: Payer: BC Managed Care – PPO

## 2022-03-06 ENCOUNTER — Other Ambulatory Visit: Payer: Self-pay | Admitting: Physician Assistant

## 2022-03-06 ENCOUNTER — Other Ambulatory Visit: Payer: BC Managed Care – PPO

## 2022-03-06 DIAGNOSIS — R609 Edema, unspecified: Secondary | ICD-10-CM

## 2022-03-06 DIAGNOSIS — R7989 Other specified abnormal findings of blood chemistry: Secondary | ICD-10-CM

## 2022-03-07 ENCOUNTER — Other Ambulatory Visit: Payer: Self-pay | Admitting: Physician Assistant

## 2022-03-07 ENCOUNTER — Other Ambulatory Visit: Payer: BC Managed Care – PPO

## 2022-03-07 DIAGNOSIS — R7989 Other specified abnormal findings of blood chemistry: Secondary | ICD-10-CM

## 2022-03-07 DIAGNOSIS — R2241 Localized swelling, mass and lump, right lower limb: Secondary | ICD-10-CM

## 2022-03-08 ENCOUNTER — Ambulatory Visit
Admission: RE | Admit: 2022-03-08 | Discharge: 2022-03-08 | Disposition: A | Discharge status: Home / Self Care | Payer: BC Managed Care – PPO | Source: Ambulatory Visit | Attending: Physician Assistant | Admitting: Physician Assistant

## 2022-03-08 DIAGNOSIS — R7989 Other specified abnormal findings of blood chemistry: Secondary | ICD-10-CM

## 2022-03-08 DIAGNOSIS — R600 Localized edema: Secondary | ICD-10-CM

## 2022-03-08 DIAGNOSIS — R2241 Localized swelling, mass and lump, right lower limb: Secondary | ICD-10-CM

## 2022-03-08 MED ORDER — IOPAMIDOL (ISOVUE-370) INJECTION 76%
75.0000 mL | Freq: Once | INTRAVENOUS | Status: AC | PRN
  Administered 2022-03-08: 75 mL via INTRAVENOUS

## 2022-03-09 ENCOUNTER — Ambulatory Visit
Admission: RE | Admit: 2022-03-09 | Discharge: 2022-03-09 | Disposition: A | Discharge status: Home / Self Care | Payer: BC Managed Care – PPO | Source: Ambulatory Visit | Attending: Interventional Radiology | Admitting: Interventional Radiology

## 2022-03-09 ENCOUNTER — Encounter: Payer: Self-pay | Admitting: *Deleted

## 2022-03-09 DIAGNOSIS — N9489 Other specified conditions associated with female genital organs and menstrual cycle: Secondary | ICD-10-CM

## 2022-03-09 HISTORY — PX: IR RADIOLOGIST EVAL & MGMT: IMG5224

## 2022-03-09 NOTE — Progress Notes (Signed)
Patient ID: Katrina Blackwell, female   DOB: 1985-12-24, 36 y.o.   MRN: [id number] ?    ? ? ?Chief Complaint: ?Patient was consulted remotely today (TeleHealth) for follow-up left gonadal vein embolization at the request of [name].   ? ?Referring Physician(s): ?Osborn Coho ? ?History of Present Illness: ?Katrina Blackwell is a 36 y.o. G2, P2 36 y.o. female who presented ?12/01/2021 with acute right lower quadrant abdominal pain and CT   demonstrated malpositioned IUD.  The CT also demonstrated enlarged refluxing right ovarian vein with distended parametrial venous plexus left worse than right. ?12/04/2021 the malpositioned IUD was subsequently removed. ?She had persistent pelvic cramping type pain mainly at night.  She rated it   10 out of 10 on the visual analog pain scale.  Pain radiates to the right thigh.    bilateral pelvic tenderness.  She was using hydrocodone Nightly for pain control which allows her to sleep. No external venous varices.  Denies dyspareunia.     No urinary or fecal incontinence.  No fever or chills.  No GI symptoms. ?02/07/2022 patient underwent uncomplicated left gonadal vein coil embolization via right femoral approach without complication, was discharged home in good condition same day ? ?Her initial postprocedural course was uneventful.  She did develop some right lower extremity swelling.   ?03/08/2022   D-dimer is positive.  CTA chest was obtained which was negative.  This did not extend into the to the level of the gonadal vein.  Right lower extremity venous Doppler ultrasound showed no DVT. ?  ? ?Past Medical History:  ?Diagnosis Date  ? Anxiety   ? Headache(784.0)   ? MIGRAINES;OTC  ? ? ?Past Surgical History:  ?Procedure Laterality Date  ? IR EMBO VENOUS NOT HEMORR HEMANG  INC GUIDE ROADMAPPING  02/07/2022  ? IR RADIOLOGIST EVAL & MGMT  01/01/2022  ? IR US GUIDE VASC ACCESS RIGHT  02/07/2022  ? IR VENOGRAM RENAL UNI LEFT  02/07/2022  ? TONSILLECTOMY  2009  ? ? ?Allergies: ?Patient has no  known allergies. ? ?Medications: ?Prior to Admission medications   ?Medication Sig Start Date End Date Taking? Authorizing Provider  ?acetaminophen (TYLENOL) 500 MG tablet Take 500 mg by mouth every 6 (six) hours as needed for moderate pain or headache.    [provider]  ?benzonatate (TESSALON) 200 MG capsule Take 1 capsule (200 mg total) by mouth 2 (two) times daily as needed for cough. 01/28/19   Jeannie Fend, PA-C  ?ibuprofen (ADVIL,MOTRIN) 600 MG tablet Take 1 tablet (600 mg total) by mouth every 6 (six) hours. ?Patient not taking: Reported on 11/15/2018 09/11/17   Arlyce Harman, MD  ?ondansetron (ZOFRAN ODT) 4 MG disintegrating tablet Take 1 tablet (4 mg total) by mouth every 8 (eight) hours as needed for nausea or vomiting. 10/22/19   Antony Madura, PA-C  ?phenazopyridine (PYRIDIUM) 200 MG tablet Take 1 tablet (200 mg total) by mouth 3 (three) times daily as needed for pain. 05/27/19   Garlon Hatchet, PA-C  ?Prenat-FeAsp-Meth-FA-DHA w/o A (PRENATE PIXIE) 10-0.6-0.4-200 MG CAPS Take 1 tablet by mouth daily. ?Patient not taking: Reported on 11/15/2018 10/22/17   Adam Phenix, MD  ?Prenatal Vit-Fe Fumarate-FA (PRENATAL MULTIVITAMIN) TABS tablet Take 1 tablet by mouth daily at 12 noon.    [provider]  ?Rimegepant Sulfate (NURTEC) 75 MG TBDP Take by mouth.    [provider]  ?  ? ?Family History  ?Problem Relation Age of Onset  ? Hyperlipidemia Mother   ?  Diabetes Father   ? Heart disease Father   ? Other Neg Hx   ? ? ?Social History  ? ?Socioeconomic History  ? Marital status: Married  ?  Spouse name: [name]  ? Number of children: Not on file  ? Years of education: 40  ? Highest education level: Not on file  ?Occupational History  ? Occupation: HOMEMAKER-STUDENT  ?Tobacco Use  ? Smoking status: Former  ?  Packs/day: 0.05  ?  Types: Cigarettes  ? Smokeless tobacco: Never  ?Vaping Use  ? Vaping Use: Never used  ?Substance and Sexual Activity  ? Alcohol use: No  ? Drug use: No   ? Sexual activity: Yes  ?  Partners: Male  ?  Birth control/protection: I.U.D.  ?Other Topics Concern  ? Not on file  ?Social History Narrative  ? Not on file  ? ?Social Determinants of Health  ? ?Financial Resource Strain: Not on file  ?Food Insecurity: Not on file  ?Transportation Needs: Not on file  ?Physical Activity: Not on file  ?Stress: Not on file  ?Social Connections: Not on file  ? ? ?ECOG Status: ?1 - Symptomatic but completely ambulatory ? ?Review of Systems ? ?Review of Systems: A 12 point ROS discussed and pertinent positives are indicated in the HPI above.  All other systems are negative. ? ?Physical Exam ?No direct physical exam was performed (except for noted visual exam findings with Video Visits).  ?   ?Vital Signs: ?There were no vitals taken for this visit. ? ?Imaging: ?US Venous Img Lower Unilateral Right (DVT) ? ?Result Date: 03/08/2022 ?CLINICAL DATA:  Localized swelling, mass and lump, right lower limb, POSITIVE D DIMER. RULE OUT DVT-PER KENDRA @ OFFICE EXAM: RIGHT LOWER EXTREMITY VENOUS DOPPLER ULTRASOUND TECHNIQUE: Gray-scale sonography with graded compression, as well as color Doppler and duplex ultrasound were performed to evaluate the lower extremity deep venous systems from the level of the common femoral vein and including the common femoral, femoral, profunda femoral, popliteal and calf veins including the posterior tibial, peroneal and gastrocnemius veins when visible. The superficial great saphenous vein was also interrogated. Spectral Doppler was utilized to evaluate flow at rest and with distal augmentation maneuvers in the common femoral, femoral and popliteal veins. COMPARISON:  None. FINDINGS: Contralateral Common Femoral Vein: Respiratory phasicity is normal and symmetric with the symptomatic side. No evidence of thrombus. Normal compressibility. Common Femoral Vein: No evidence of thrombus. Normal compressibility, respiratory phasicity and response to augmentation.  Saphenofemoral Junction: No evidence of thrombus. Normal compressibility and flow on color Doppler imaging. Profunda Femoral Vein: No evidence of thrombus. Normal compressibility and flow on color Doppler imaging. Femoral Vein: No evidence of thrombus. Normal compressibility, respiratory phasicity and response to augmentation. Popliteal Vein: No evidence of thrombus. Normal compressibility, respiratory phasicity and response to augmentation. Calf Veins: No evidence of thrombus. Normal compressibility and flow on color Doppler imaging. Superficial Great Saphenous Vein: No evidence of thrombus. Normal compressibility. Venous Reflux:  None. Other Findings: No fluid collection or other abnormality within the popliteal fossa. IMPRESSION: No evidence of right lower extremity deep venous thrombosis. Electronically Signed   By: Duanne Guess D.O.   On: 03/08/2022 12:06  ? ?CT ANGIO CHEST AORTA W/CM & OR WO/CM ? ?Result Date: 03/08/2022 ?CLINICAL DATA:  Edema and swelling of the right lower extremity. Shortness of breath EXAM: CT ANGIOGRAPHY CHEST WITH CONTRAST TECHNIQUE: Multidetector CT imaging of the chest was performed using the standard protocol during bolus administration of intravenous contrast. Multiplanar  CT image reconstructions and MIPs were obtained to evaluate the vascular anatomy. RADIATION DOSE REDUCTION: This exam was performed according to the departmental dose-optimization program which includes automated exposure control, adjustment of the mA and/or kV according to patient size and/or use of iterative reconstruction technique. CONTRAST:  75mL ISOVUE-370 IOPAMIDOL (ISOVUE-370) INJECTION 76% COMPARISON:  June 08, 2015 FINDINGS: Cardiovascular: No cardiomegaly. Pulmonary trunk and its branches have a normal appearance without evidence of a PE. Thoracic aorta is within normal limits. Mediastinum/Nodes: No mediastinal mass or significant lymphadenopathy. Lungs/Pleura: Lungs are clear.  No pleural effusion.  Upper Abdomen: Mild hepatomegaly Musculoskeletal: Unremarkable Review of the MIP images confirms the above findings. IMPRESSION: Heart, thoracic aorta and pulmonary vasculature have a normal appearance. No PE. Clint BolderMil

## 2022-03-19 DIAGNOSIS — Z419 Encounter for procedure for purposes other than remedying health state, unspecified: Secondary | ICD-10-CM | POA: Diagnosis not present

## 2022-04-19 DIAGNOSIS — Z419 Encounter for procedure for purposes other than remedying health state, unspecified: Secondary | ICD-10-CM | POA: Diagnosis not present

## 2022-05-19 DIAGNOSIS — Z419 Encounter for procedure for purposes other than remedying health state, unspecified: Secondary | ICD-10-CM | POA: Diagnosis not present

## 2022-06-19 DIAGNOSIS — Z419 Encounter for procedure for purposes other than remedying health state, unspecified: Secondary | ICD-10-CM | POA: Diagnosis not present

## 2022-07-20 DIAGNOSIS — Z419 Encounter for procedure for purposes other than remedying health state, unspecified: Secondary | ICD-10-CM | POA: Diagnosis not present

## 2022-08-19 DIAGNOSIS — Z419 Encounter for procedure for purposes other than remedying health state, unspecified: Secondary | ICD-10-CM | POA: Diagnosis not present

## 2022-09-02 DIAGNOSIS — Z23 Encounter for immunization: Secondary | ICD-10-CM | POA: Diagnosis not present

## 2022-09-06 DIAGNOSIS — R55 Syncope and collapse: Secondary | ICD-10-CM | POA: Diagnosis not present

## 2022-09-06 DIAGNOSIS — R109 Unspecified abdominal pain: Secondary | ICD-10-CM | POA: Diagnosis not present

## 2022-09-19 DIAGNOSIS — Z419 Encounter for procedure for purposes other than remedying health state, unspecified: Secondary | ICD-10-CM | POA: Diagnosis not present

## 2022-10-17 DIAGNOSIS — J019 Acute sinusitis, unspecified: Secondary | ICD-10-CM | POA: Diagnosis not present

## 2022-10-17 DIAGNOSIS — Z20822 Contact with and (suspected) exposure to covid-19: Secondary | ICD-10-CM | POA: Diagnosis not present

## 2022-10-17 DIAGNOSIS — B349 Viral infection, unspecified: Secondary | ICD-10-CM | POA: Diagnosis not present

## 2022-10-17 DIAGNOSIS — N760 Acute vaginitis: Secondary | ICD-10-CM | POA: Diagnosis not present

## 2022-10-19 DIAGNOSIS — Z419 Encounter for procedure for purposes other than remedying health state, unspecified: Secondary | ICD-10-CM | POA: Diagnosis not present

## 2022-11-18 DIAGNOSIS — J09X2 Influenza due to identified novel influenza A virus with other respiratory manifestations: Secondary | ICD-10-CM | POA: Diagnosis not present

## 2022-11-19 DIAGNOSIS — Z419 Encounter for procedure for purposes other than remedying health state, unspecified: Secondary | ICD-10-CM | POA: Diagnosis not present

## 2022-11-19 NOTE — L&D Delivery Note (Signed)
OB/GYN [facility] Delivery Note  Katrina Blackwell is a 37 y.o. [id number] s/p VD at [redacted]w[redacted]d. She was admitted for IOL 2/2 A2GDM w/ poly.   ROM: unknown GBS Status:  Negative/-- (10/03 0000) Maximum Maternal Temperature: 99.2  Labor Progress: Initial SVE: 1cm/thick. She then progressed to complete.   Delivery Date/Time: 09/06/2023 @0613  Delivery: Called to room and patient was complete and pushing. Head delivered LOA. No nuchal cord present. There was a compound right hand. The compound hand was release and the shoulder and body delivered in usual fashion. Infant with weak spontaneous cry, placed on mother's abdomen, dried and stimulated. Due to poor tone the infant's cord was clamped x 2 with minimal delay, and cut by this provider. She was taken over to the warmer for further evaluation. Cord gases collected and blood drawn. Placenta delivered spontaneously with gentle cord traction. Fundus firm with massage and Pitocin. Labia, perineum, vagina, and cervix inspected. The patient female circumcision was lacerated. She desired repair, it was re approximated. NICU consult by phone due fetal HR of 200s. HR improved to 170s and the infant was return to skin to skin.  Baby Weight: pending  Placenta: 3 vessel, intact. Sent to pathology Complications: Cord gas clotted, unable to read Lacerations: Female circumcision labial fusion repaired EBL: 550 mL Analgesia: Epidural   Infant:  APGAR (1 MIN): 7  APGAR (5 MINS): 8   Katrina Azzarello Autry-Lott, DO 09/06/2023, 7:24 AM

## 2023-01-01 DIAGNOSIS — M130 Polyarthritis, unspecified: Secondary | ICD-10-CM | POA: Diagnosis not present

## 2023-01-01 DIAGNOSIS — M545 Low back pain, unspecified: Secondary | ICD-10-CM | POA: Diagnosis not present

## 2023-01-01 DIAGNOSIS — M25561 Pain in right knee: Secondary | ICD-10-CM | POA: Diagnosis not present

## 2023-01-01 DIAGNOSIS — R5381 Other malaise: Secondary | ICD-10-CM | POA: Diagnosis not present

## 2023-01-01 DIAGNOSIS — G4719 Other hypersomnia: Secondary | ICD-10-CM | POA: Diagnosis not present

## 2023-02-08 DIAGNOSIS — Z3201 Encounter for pregnancy test, result positive: Secondary | ICD-10-CM | POA: Diagnosis not present

## 2023-02-08 DIAGNOSIS — Z0000 Encounter for general adult medical examination without abnormal findings: Secondary | ICD-10-CM | POA: Diagnosis not present

## 2023-02-08 DIAGNOSIS — Z124 Encounter for screening for malignant neoplasm of cervix: Secondary | ICD-10-CM | POA: Diagnosis not present

## 2023-02-08 DIAGNOSIS — Z113 Encounter for screening for infections with a predominantly sexual mode of transmission: Secondary | ICD-10-CM | POA: Diagnosis not present

## 2023-02-14 DIAGNOSIS — Z3A08 8 weeks gestation of pregnancy: Secondary | ICD-10-CM | POA: Diagnosis not present

## 2023-02-14 DIAGNOSIS — O3680X9 Pregnancy with inconclusive fetal viability, other fetus: Secondary | ICD-10-CM | POA: Diagnosis not present

## 2023-02-18 LAB — OB RESULTS CONSOLE GC/CHLAMYDIA
Chlamydia: NEGATIVE
Neisseria Gonorrhea: NEGATIVE

## 2023-03-07 DIAGNOSIS — G43909 Migraine, unspecified, not intractable, without status migrainosus: Secondary | ICD-10-CM | POA: Diagnosis not present

## 2023-03-07 DIAGNOSIS — Z3481 Encounter for supervision of other normal pregnancy, first trimester: Secondary | ICD-10-CM | POA: Diagnosis not present

## 2023-03-07 DIAGNOSIS — N925 Other specified irregular menstruation: Secondary | ICD-10-CM | POA: Diagnosis not present

## 2023-03-07 DIAGNOSIS — O3680X9 Pregnancy with inconclusive fetal viability, other fetus: Secondary | ICD-10-CM | POA: Diagnosis not present

## 2023-03-07 DIAGNOSIS — R110 Nausea: Secondary | ICD-10-CM | POA: Diagnosis not present

## 2023-03-07 LAB — HEPATITIS C ANTIBODY: HCV Ab: NEGATIVE

## 2023-03-07 LAB — OB RESULTS CONSOLE HEPATITIS B SURFACE ANTIGEN: Hepatitis B Surface Ag: NEGATIVE

## 2023-03-07 LAB — OB RESULTS CONSOLE RPR: RPR: NONREACTIVE

## 2023-03-07 LAB — OB RESULTS CONSOLE HIV ANTIBODY (ROUTINE TESTING): HIV: NONREACTIVE

## 2023-03-07 LAB — OB RESULTS CONSOLE ANTIBODY SCREEN: Antibody Screen: NEGATIVE

## 2023-03-07 LAB — OB RESULTS CONSOLE RUBELLA ANTIBODY, IGM: Rubella: IMMUNE

## 2023-03-15 ENCOUNTER — Inpatient Hospital Stay (HOSPITAL_COMMUNITY)
Admission: AD | Admit: 2023-03-15 | Discharge: 2023-03-15 | Disposition: A | Discharge status: Home / Self Care | Payer: Medicaid Other | Attending: Obstetrics & Gynecology | Admitting: Obstetrics & Gynecology

## 2023-03-15 ENCOUNTER — Encounter (HOSPITAL_COMMUNITY): Payer: Self-pay | Admitting: *Deleted

## 2023-03-15 DIAGNOSIS — O219 Vomiting of pregnancy, unspecified: Secondary | ICD-10-CM | POA: Diagnosis not present

## 2023-03-15 DIAGNOSIS — O99281 Endocrine, nutritional and metabolic diseases complicating pregnancy, first trimester: Secondary | ICD-10-CM | POA: Insufficient documentation

## 2023-03-15 DIAGNOSIS — E860 Dehydration: Secondary | ICD-10-CM | POA: Diagnosis not present

## 2023-03-15 DIAGNOSIS — Z3A12 12 weeks gestation of pregnancy: Secondary | ICD-10-CM | POA: Diagnosis not present

## 2023-03-15 DIAGNOSIS — O26891 Other specified pregnancy related conditions, first trimester: Secondary | ICD-10-CM | POA: Diagnosis not present

## 2023-03-15 DIAGNOSIS — R519 Headache, unspecified: Secondary | ICD-10-CM | POA: Insufficient documentation

## 2023-03-15 LAB — URINALYSIS, ROUTINE W REFLEX MICROSCOPIC
Bilirubin Urine: NEGATIVE
Glucose, UA: NEGATIVE mg/dL
Hgb urine dipstick: NEGATIVE
Ketones, ur: 20 mg/dL — AB
Leukocytes,Ua: NEGATIVE
Nitrite: NEGATIVE
Protein, ur: NEGATIVE mg/dL
Specific Gravity, Urine: 1.014 (ref 1.005–1.030)
pH: 6 (ref 5.0–8.0)

## 2023-03-15 MED ORDER — PROCHLORPERAZINE EDISYLATE 10 MG/2ML IJ SOLN
10.0000 mg | Freq: Once | INTRAMUSCULAR | Status: AC
  Administered 2023-03-15: 10 mg via INTRAVENOUS
  Filled 2023-03-15: qty 2

## 2023-03-15 MED ORDER — ONDANSETRON 4 MG PO TBDP: 4.0000 mg | ORAL_TABLET | Freq: Three times a day (TID) | ORAL | 0 refills | Status: DC | PRN

## 2023-03-15 MED ORDER — FAMOTIDINE 20 MG PO TABS: 20.0000 mg | ORAL_TABLET | Freq: Every day | ORAL | 11 refills | Status: DC

## 2023-03-15 MED ORDER — SODIUM CHLORIDE 0.9 % IV SOLN
8.0000 mg | Freq: Once | INTRAVENOUS | Status: AC
  Administered 2023-03-15: 8 mg via INTRAVENOUS
  Filled 2023-03-15: qty 4

## 2023-03-15 MED ORDER — LACTATED RINGERS IV BOLUS
1000.0000 mL | Freq: Once | INTRAVENOUS | Status: AC
Start: 2023-03-15 — End: 2023-03-15
  Administered 2023-03-15: 1000 mL via INTRAVENOUS

## 2023-03-15 MED ORDER — ACETAMINOPHEN 10 MG/ML IV SOLN
1000.0000 mg | Freq: Once | INTRAVENOUS | Status: AC
  Administered 2023-03-15: 1000 mg via INTRAVENOUS
  Filled 2023-03-15: qty 100

## 2023-03-15 NOTE — MAU Note (Addendum)
.  Katrina Blackwell is a 37 y.o. at Unknown here in MAU reporting: n/v since 4am. Has tried promethazine but has not been able to keep it down. Unable to keep any foods or fluids down. C/o headache as well. LMP:  Onset of complaint: 4am Pain score: 8 Vitals:   03/15/23 1511  BP: 117/75  Pulse: 97  Resp: 18  Temp: 98.3 F (36.8 C)     FHT:160 Lab orders placed from triage:  U/A(unable to urinate at this time).

## 2023-03-15 NOTE — MAU Provider Note (Cosign Needed Addendum)
History     [id number]  Arrival date and time: 03/15/23 1454    Chief Complaint  Patient presents with   Nausea   Emesis     HPI Katrina Blackwell is a 37 y.o. G3P2002 at [redacted]w[redacted]d who presents for N/V and headache since this morning. Woke up and unable to keep down breakfast. States she usually takes phenergan for nausea but took it today around 0900 without relief of her nausea. Unable to keep down anything since then. No recent fevers, illness, sick contacts, abdominal/RUQ pain.  Reports hx of chronic migraines, reports her current HA episode has just been worse with her nausea/vomiting. Denies vision changes, leg swelling, weakness, abdominal/RUQ pain. Did not take any pain meds today. No hx of high blood pressure.  No VB, CTX, LOF. Possible FM     OB History     Gravida  3   Para  2   Term  2   Preterm      AB      Living  2      SAB      IAB      Ectopic      Multiple  0   Live Births  2           Past Medical History:  Diagnosis Date   Anxiety    Headache(784.0)    MIGRAINES;OTC    Past Surgical History:  Procedure Laterality Date   IR EMBO VENOUS NOT HEMORR HEMANG  INC GUIDE ROADMAPPING  02/07/2022   IR RADIOLOGIST EVAL & MGMT  01/01/2022   IR RADIOLOGIST EVAL & MGMT  03/09/2022   IR US GUIDE VASC ACCESS RIGHT  02/07/2022   IR VENOGRAM RENAL UNI LEFT  02/07/2022   TONSILLECTOMY  2009    Family History  Problem Relation Age of Onset   Hyperlipidemia Mother    Diabetes Father    Heart disease Father    Other Neg Hx     Social History   Socioeconomic History   Marital status: Married    Spouse name: [name]   Number of children: Not on file   Years of education: 16   Highest education level: Not on file  Occupational History   Occupation: HOMEMAKER-STUDENT  Tobacco Use   Smoking status: Former    Packs/day: .05    Types: Cigarettes   Smokeless tobacco: Never  Vaping Use   Vaping Use: Never used  Substance and Sexual Activity    Alcohol use: No   Drug use: No   Sexual activity: Yes    Partners: Male  Other Topics Concern   Not on file  Social History Narrative   Not on file   Social Determinants of Health   Financial Resource Strain: Not on file  Food Insecurity: Not on file  Transportation Needs: Not on file  Physical Activity: Not on file  Stress: Not on file  Social Connections: Not on file  Intimate Partner Violence: Not on file    No Known Allergies  No current facility-administered medications on file prior to encounter.   Current Outpatient Medications on File Prior to Encounter  Medication Sig Dispense Refill   acetaminophen (TYLENOL) 500 MG tablet Take 500 mg by mouth every 6 (six) hours as needed for moderate pain or headache.     Prenatal Vit-Fe Fumarate-FA (PRENATAL MULTIVITAMIN) TABS tablet Take 1 tablet by mouth daily at 12 noon.     promethazine (PHENERGAN) 25 MG suppository Place 25 mg rectally every  6 (six) hours as needed for nausea or vomiting.     benzonatate (TESSALON) 200 MG capsule Take 1 capsule (200 mg total) by mouth 2 (two) times daily as needed for cough. (Patient not taking: Reported on 03/15/2023) 30 capsule 0   ibuprofen (ADVIL,MOTRIN) 600 MG tablet Take 1 tablet (600 mg total) by mouth every 6 (six) hours. (Patient not taking: Reported on 11/15/2018) 40 tablet 1   phenazopyridine (PYRIDIUM) 200 MG tablet Take 1 tablet (200 mg total) by mouth 3 (three) times daily as needed for pain. (Patient not taking: Reported on 03/15/2023) 9 tablet 0   Prenat-FeAsp-Meth-FA-DHA w/o A (PRENATE PIXIE) 10-0.6-0.4-200 MG CAPS Take 1 tablet by mouth daily. (Patient not taking: Reported on 11/15/2018) 30 capsule 12   Rimegepant Sulfate (NURTEC) 75 MG TBDP Take by mouth.       ROS Pertinent positives and negative per HPI, all others reviewed and negative  Physical Exam   BP 117/75   Pulse 97   Temp 98.3 F (36.8 C)   Resp 18   Ht 5\' 6"  (1.676 m)   Wt 68.9 kg   BMI 24.53 kg/m    Patient Vitals for the past 24 hrs:  BP Temp Pulse Resp Height Weight  03/15/23 1511 117/75 98.3 F (36.8 C) 97 18 5\' 6"  (1.676 m) 68.9 kg    Physical Exam Constitutional:      General: She is not in acute distress. HENT:     Mouth/Throat:     Mouth: Mucous membranes are dry.  Cardiovascular:     Rate and Rhythm: Normal rate and regular rhythm.     Heart sounds: Normal heart sounds. No murmur heard. Pulmonary:     Effort: Pulmonary effort is normal. No respiratory distress.     Breath sounds: Normal breath sounds.  Abdominal:     Tenderness: There is no abdominal tenderness. There is no guarding.  Musculoskeletal:        General: No swelling or tenderness.     Right lower leg: No edema.     Left lower leg: No edema.  Skin:    General: Skin is dry.     Capillary Refill: Capillary refill takes 2 to 3 seconds.     Findings: No rash.  Neurological:     General: No focal deficit present.     Mental Status: She is alert.       Labs Results for orders placed or performed during the hospital encounter of 03/15/23 (from the past 24 hour(s))  Urinalysis, Routine w reflex microscopic -Urine, Clean Catch     Status: Abnormal   Collection Time: 03/15/23  3:11 PM  Result Value Ref Range   Color, Urine YELLOW YELLOW   APPearance CLOUDY (A) CLEAR   Specific Gravity, Urine 1.014 1.005 - 1.030   pH 6.0 5.0 - 8.0   Glucose, UA NEGATIVE NEGATIVE mg/dL   Hgb urine dipstick NEGATIVE NEGATIVE   Bilirubin Urine NEGATIVE NEGATIVE   Ketones, ur 20 (A) NEGATIVE mg/dL   Protein, ur NEGATIVE NEGATIVE mg/dL   Nitrite NEGATIVE NEGATIVE   Leukocytes,Ua NEGATIVE NEGATIVE    Imaging No results found.  MAU Course  Procedures Lab Orders         Urinalysis, Routine w reflex microscopic -Urine, Clean Catch    Meds ordered this encounter  Medications   ondansetron (ZOFRAN) 8 mg in sodium chloride 0.9 % 50 mL IVPB   acetaminophen (OFIRMEV) IV 1,000 mg    Order Specific Question:  Is the  patient UNABLE to take oral / enteral medications?    Answer:   Yes   lactated ringers bolus 1,000 mL   ondansetron (ZOFRAN ODT) 4 MG disintegrating tablet    Sig: Take 1 tablet (4 mg total) by mouth every 8 (eight) hours as needed for nausea or vomiting.    Dispense:  10 tablet    Refill:  0   famotidine (PEPCID) 20 MG tablet    Sig: Take 1 tablet (20 mg total) by mouth daily.    Dispense:  30 tablet    Refill:  11   prochlorperazine (COMPAZINE) injection 10 mg   Imaging Orders  No imaging studies ordered today    MDM moderate  Assessment and Plan   1. Nausea/vomiting in pregnancy   2. Pregnancy headache in first trimester   3. [redacted] weeks gestation of pregnancy     Clinically dehydrated on exam, suspect her acute on chronic headache is 2/2 dehydration in the setting of N/V. Vitals wnl, no leg swelling. UA unremarkable, mild ketonuria likely 2/2 dehydration.  Will give 1L IV LR bolus x1, IV zofran x1, IV Tylenol x1 then reevaluate.  Upon reevaluation, nausea improved, able to tolerate PO, but headache persistent as well as mild heartburn, gave IV compazine x1 with improvement. Pt sent home with refill of Zofran as well as new prescription of Pepcid.   Dispo: discharged to home in stable condition w/ return precautions   Discharge Instructions     Discharge patient   Complete by: As directed    Discharge disposition: 01-Home or Self Care   Discharge patient date: 03/15/2023       Vonna Drafts, MD 03/15/23 7:31 PM  Allergies as of 03/15/2023   No Known Allergies      Medication List     STOP taking these medications    ibuprofen 600 MG tablet Commonly known as: ADVIL   Nurtec 75 MG Tbdp Generic drug: Rimegepant Sulfate       TAKE these medications    acetaminophen 500 MG tablet Commonly known as: TYLENOL Take 500 mg by mouth every 6 (six) hours as needed for moderate pain or headache.   benzonatate 200 MG capsule Commonly known as: TESSALON Take  1 capsule (200 mg total) by mouth 2 (two) times daily as needed for cough.   famotidine 20 MG tablet Commonly known as: Pepcid Take 1 tablet (20 mg total) by mouth daily.   ondansetron 4 MG disintegrating tablet Commonly known as: Zofran ODT Take 1 tablet (4 mg total) by mouth every 8 (eight) hours as needed for nausea or vomiting.   phenazopyridine 200 MG tablet Commonly known as: PYRIDIUM Take 1 tablet (200 mg total) by mouth 3 (three) times daily as needed for pain.   prenatal multivitamin Tabs tablet Take 1 tablet by mouth daily at 12 noon.   Prenate Pixie 10-0.6-0.4-200 MG Caps Take 1 tablet by mouth daily.   promethazine 25 MG suppository Commonly known as: PHENERGAN Place 25 mg rectally every 6 (six) hours as needed for nausea or vomiting.        Attestation of Supervision of Resident:  I confirm that I have verified the information documented in the  resident's  note and that I have also personally reperformed the history, physical exam and all medical decision making activities.  I have verified that all services and findings are accurately documented in this student's note; and I agree with management and plan as outlined in the  documentation. I have also made any necessary editorial changes.  Thressa Sheller DNP, CNM  03/15/23  8:39 PM

## 2023-04-01 ENCOUNTER — Other Ambulatory Visit: Payer: Self-pay | Admitting: Obstetrics and Gynecology

## 2023-04-01 DIAGNOSIS — O09511 Supervision of elderly primigravida, first trimester: Secondary | ICD-10-CM

## 2023-04-24 DIAGNOSIS — R5383 Other fatigue: Secondary | ICD-10-CM | POA: Diagnosis not present

## 2023-04-24 DIAGNOSIS — E559 Vitamin D deficiency, unspecified: Secondary | ICD-10-CM | POA: Diagnosis not present

## 2023-04-24 DIAGNOSIS — R002 Palpitations: Secondary | ICD-10-CM | POA: Diagnosis not present

## 2023-04-24 DIAGNOSIS — G43909 Migraine, unspecified, not intractable, without status migrainosus: Secondary | ICD-10-CM | POA: Diagnosis not present

## 2023-05-01 ENCOUNTER — Encounter: Payer: Self-pay | Admitting: *Deleted

## 2023-05-02 ENCOUNTER — Telehealth: Payer: Self-pay

## 2023-05-02 NOTE — Telephone Encounter (Signed)
Left message for patient to call back if interested in coming in Friday 05/03/23 morning for ultrasound

## 2023-05-03 ENCOUNTER — Ambulatory Visit: Payer: Medicaid Other | Attending: Obstetrics and Gynecology

## 2023-05-03 ENCOUNTER — Ambulatory Visit: Payer: Medicaid Other | Admitting: *Deleted

## 2023-05-03 VITALS — BP 116/69 | HR 88

## 2023-05-03 DIAGNOSIS — Z362 Encounter for other antenatal screening follow-up: Secondary | ICD-10-CM | POA: Diagnosis present

## 2023-05-03 DIAGNOSIS — O09522 Supervision of elderly multigravida, second trimester: Secondary | ICD-10-CM

## 2023-05-03 DIAGNOSIS — O09511 Supervision of elderly primigravida, first trimester: Secondary | ICD-10-CM | POA: Diagnosis present

## 2023-05-03 DIAGNOSIS — O285 Abnormal chromosomal and genetic finding on antenatal screening of mother: Secondary | ICD-10-CM | POA: Diagnosis not present

## 2023-05-03 DIAGNOSIS — Z148 Genetic carrier of other disease: Secondary | ICD-10-CM

## 2023-05-03 DIAGNOSIS — O09292 Supervision of pregnancy with other poor reproductive or obstetric history, second trimester: Secondary | ICD-10-CM | POA: Diagnosis not present

## 2023-05-03 DIAGNOSIS — Z3A19 19 weeks gestation of pregnancy: Secondary | ICD-10-CM

## 2023-05-06 ENCOUNTER — Other Ambulatory Visit: Payer: Self-pay | Admitting: *Deleted

## 2023-05-06 DIAGNOSIS — Z148 Genetic carrier of other disease: Secondary | ICD-10-CM

## 2023-05-06 DIAGNOSIS — N3000 Acute cystitis without hematuria: Secondary | ICD-10-CM | POA: Diagnosis not present

## 2023-05-06 DIAGNOSIS — O09522 Supervision of elderly multigravida, second trimester: Secondary | ICD-10-CM

## 2023-05-17 ENCOUNTER — Other Ambulatory Visit (INDEPENDENT_AMBULATORY_CARE_PROVIDER_SITE_OTHER): Payer: Medicaid Other

## 2023-05-17 ENCOUNTER — Ambulatory Visit: Payer: Medicaid Other | Admitting: Cardiology

## 2023-05-17 ENCOUNTER — Encounter: Payer: Self-pay | Admitting: Cardiology

## 2023-05-17 VITALS — BP 113/83 | HR 104 | Ht 66.0 in | Wt 160.4 lb

## 2023-05-17 DIAGNOSIS — O09529 Supervision of elderly multigravida, unspecified trimester: Secondary | ICD-10-CM

## 2023-05-17 DIAGNOSIS — R7309 Other abnormal glucose: Secondary | ICD-10-CM | POA: Diagnosis not present

## 2023-05-17 DIAGNOSIS — R002 Palpitations: Secondary | ICD-10-CM

## 2023-05-17 DIAGNOSIS — R55 Syncope and collapse: Secondary | ICD-10-CM

## 2023-05-17 DIAGNOSIS — R0609 Other forms of dyspnea: Secondary | ICD-10-CM | POA: Diagnosis not present

## 2023-05-17 NOTE — Patient Instructions (Signed)
Medication Instructions:  Your physician recommends that you continue on your current medications as directed. Please refer to the Current Medication list given to you today.  *If you need a refill on your cardiac medications before your next appointment, please call your pharmacy*   Lab Work: None   Testing/Procedures: Your physician has requested that you have an echocardiogram. Echocardiography is a painless test that uses sound waves to create images of your heart. It provides your doctor with information about the size and shape of your heart and how well your heart's chambers and valves are working. This procedure takes approximately one hour. There are no restrictions for this procedure. Please do NOT wear cologne, perfume, aftershave, or lotions (deodorant is allowed). Please arrive 15 minutes prior to your appointment time.  ZIO XT- Long Term Monitor Instructions  Your physician has requested you wear a ZIO patch monitor for 14 days.  This is a single patch monitor. [employer] supplies one patch monitor per enrollment. Additional stickers are not available. Please do not apply patch if you will be having a Nuclear Stress Test,  Echocardiogram, Cardiac CT, MRI, or Chest Xray during the period you would be wearing the  monitor. The patch cannot be worn during these tests. You cannot remove and re-apply the  ZIO XT patch monitor.  Your ZIO patch monitor will be mailed 3 day USPS to your address on file. It may take 3-5 days  to receive your monitor after you have been enrolled.  Once you have received your monitor, please review the enclosed instructions. Your monitor  has already been registered assigning a specific monitor serial # to you.  Billing and Patient Assistance Program Information  We have supplied [employer] with any of your insurance information on file for billing purposes. [employer] offers a sliding scale Patient Assistance Program for patients that do not have   insurance, or whose insurance does not completely cover the cost of the ZIO monitor.  You must apply for the Patient Assistance Program to qualify for this discounted rate.  To apply, please call [employer] at [phone], select option 4, select option 2, ask to apply for  Patient Assistance Program. Katrina Blackwell will ask your household income, and how many people  are in your household. They will quote your out-of-pocket cost based on that information.  [employer] will also be able to set up a 38-month, interest-free payment plan if needed.  Applying the monitor   Shave hair from upper left chest.  Hold abrader disc by orange tab. Rub abrader in 40 strokes over the upper left chest as  indicated in your monitor instructions.  Clean area with 4 enclosed alcohol pads. Let dry.  Apply patch as indicated in monitor instructions. Patch will be placed under collarbone on left  side of chest with arrow pointing upward.  Rub patch adhesive wings for 2 minutes. Remove white label marked "1". Remove the white  label marked "2". Rub patch adhesive wings for 2 additional minutes.  While looking in a mirror, press and release button in center of patch. A small green light will  flash 3-4 times. This will be your only indicator that the monitor has been turned on.  Do not shower for the first 24 hours. You may shower after the first 24 hours.  Press the button if you feel a symptom. You will hear a small click. Record Date, Time and  Symptom in the Patient Logbook.  When you are ready to remove the patch, follow  instructions on the last 2 pages of Patient  Logbook. Stick patch monitor onto the last page of Patient Logbook.  Place Patient Logbook in the blue and white box. Use locking tab on box and tape box closed  securely. The blue and white box has prepaid postage on it. Please place it in the mailbox as  soon as possible. Your physician should have your test results approximately 7 days after the  monitor  has been mailed back to [facility].  Call [facility] Customer Care at [phone] if you have questions regarding  your ZIO XT patch monitor. Call them immediately if you see an orange light blinking on your  monitor.  If your monitor falls off in less than 4 days, contact our Monitor department at [phone].  If your monitor becomes loose or falls off after 4 days call [employer] at [phone] for  suggestions on securing your monitor   Follow-Up: At [facility], you and your health needs are our priority.  As part of our continuing mission to provide you with exceptional heart care, we have created designated Provider Care Teams.  These Care Teams include your primary Cardiologist (physician) and Advanced Practice Providers (APPs -  Physician Assistants and Nurse Practitioners) who all work together to provide you with the care you need, when you need it.  We recommend signing up for the patient portal called "MyChart".  Sign up information is provided on this After Visit Summary.  MyChart is used to connect with patients for Virtual Visits (Telemedicine).  Patients are able to view lab/test results, encounter notes, upcoming appointments, etc.  Non-urgent messages can be sent to your provider as well.   To learn more about what you can do with MyChart, go to [email or web address].    Your next appointment:   8 week(s)  Provider:   Thomasene Ripple, DO

## 2023-05-17 NOTE — Progress Notes (Signed)
Cardio-Obstetrics Clinic  New Evaluation  Date:  05/17/2023   ID:  Katrina Blackwell, DOB [birth year], MRN [id number]  PCP:  Patient, No Pcp Per   [facility] Providers Cardiologist:  None  Electrophysiologist:  None       Referring MD: Nigel Bridgeman, CNM   Chief Complaint: " I am   History of Present Illness:    Katrina Blackwell is a 37 y.o. female [G3P2002] who is being seen today for the evaluation of syncope in pregnancy at the request of Nigel Bridgeman, [place].    She reports worsening shortness of breath which initially started on exertion and now occurs at rest. She tell me the the palpitations that she also experienced as intermittent now is persistent with her heart rate going up in the 140 bpm.   She tells me that she also had an episode of syncope at work back in May. Note that it was associated with palpitation and then and passed out.   She has family hx of heart disease: Dad with noted cardiomyopathy and believes that her mom was born with congenital heart disease.   Prior CV Studies Reviewed: The following studies were reviewed today: None today   Past Medical History:  Diagnosis Date   Anxiety    Generalized anxiety disorder 05/11/2013   Headache(784.0)    MIGRAINES;OTC   Language barrier 05/12/2013   Speaks english, declines interpreter.    Migraines 05/11/2013    Past Surgical History:  Procedure Laterality Date   IR EMBO VENOUS NOT HEMORR HEMANG  INC GUIDE ROADMAPPING  02/07/2022   IR RADIOLOGIST EVAL & MGMT  01/01/2022   IR RADIOLOGIST EVAL & MGMT  03/09/2022   IR US GUIDE VASC ACCESS RIGHT  02/07/2022   IR VENOGRAM RENAL UNI LEFT  02/07/2022   TONSILLECTOMY  2009      OB History     Gravida  3   Para  2   Term  2   Preterm      AB      Living  2      SAB      IAB      Ectopic      Multiple  0   Live Births  2               Current Medications: Current Meds  Medication Sig   acetaminophen (TYLENOL) 500 MG tablet Take  500 mg by mouth every 6 (six) hours as needed for moderate pain or headache.   cholecalciferol (VITAMIN D3) 25 MCG (1000 UNIT) tablet Take 1,000 Units by mouth daily.   clobetasol ointment (TEMOVATE) 0.05 % Apply to scalp 2-3 times per week. Not to face.   cyanocobalamin (VITAMIN B12) 1000 MCG/ML injection Inject 1 mL every month by subcutaneous route.   famotidine (PEPCID) 20 MG tablet Take 1 tablet (20 mg total) by mouth daily.   fluconazole (DIFLUCAN) 150 MG tablet Take 150 mg by mouth every 3 (three) days.   ondansetron (ZOFRAN ODT) 4 MG disintegrating tablet Take 1 tablet (4 mg total) by mouth every 8 (eight) hours as needed for nausea or vomiting.   Prenat-FeAsp-Meth-FA-DHA w/o A (PRENATE PIXIE) 10-0.6-0.4-200 MG CAPS Take 1 tablet by mouth daily.   Prenatal Vit-Fe Fumarate-FA (PRENATAL MULTIVITAMIN) TABS tablet Take 1 tablet by mouth daily at 12 noon.   promethazine (PHENERGAN) 25 MG suppository Place 25 mg rectally every 6 (six) hours as needed for nausea or vomiting.     Allergies:  Patient has no known allergies.   Social History   Socioeconomic History   Marital status: Married    Spouse name: [name]   Number of children: Not on file   Years of education: 16   Highest education level: Not on file  Occupational History   Occupation: HOMEMAKER-STUDENT  Tobacco Use   Smoking status: Former    Packs/day: .05    Types: Cigarettes   Smokeless tobacco: Never  Vaping Use   Vaping Use: Never used  Substance and Sexual Activity   Alcohol use: No   Drug use: No   Sexual activity: Yes    Partners: Male  Other Topics Concern   Not on file  Social History Narrative   Not on file   Social Determinants of Health   Financial Resource Strain: Not on file  Food Insecurity: Not on file  Transportation Needs: Not on file  Physical Activity: Not on file  Stress: Not on file  Social Connections: Not on file      Family History  Problem Relation Age of Onset    Hyperlipidemia Mother    Diabetes Father    Heart disease Father    Other Neg Hx       ROS:   Please see the history of present illness.     All other systems reviewed and are negative.   Labs/EKG Reviewed:    EKG:   EKG is was ordered today.  The ekg ordered today demonstrates Sinus tachycardia, HR 104 bpm  Recent Labs: No results found for requested labs within last 365 days.   Recent Lipid Panel No results found for: "CHOL", "TRIG", "HDL", "CHOLHDL", "LDLCALC", "LDLDIRECT"  Physical Exam:    VS:  BP 113/83 (BP Location: Left Arm, Patient Position: Sitting, Cuff Size: Normal)   Pulse (!) 104   Ht 5\' 6"  (1.676 m)   Wt 160 lb 6.4 oz (72.8 kg)   SpO2 100%   BMI 25.89 kg/m     Wt Readings from Last 3 Encounters:  05/17/23 160 lb 6.4 oz (72.8 kg)  03/15/23 152 lb (68.9 kg)  02/07/22 146 lb (66.2 kg)     GEN:  Well nourished, well developed in no acute distress HEENT: Normal NECK: No JVD; No carotid bruits LYMPHATICS: No lymphadenopathy CARDIAC: RRR, no murmurs, rubs, gallops RESPIRATORY:  Clear to auscultation without rales, wheezing or rhonchi  ABDOMEN: Soft, non-tender, non-distended MUSCULOSKELETAL:  No edema; No deformity  SKIN: Warm and dry NEUROLOGIC:  Alert and oriented x 3 PSYCHIATRIC:  Normal affect    Risk Assessment/Risk Calculators:     CARPREG II Risk Prediction Index Score:  1.  The patient's risk for a primary cardiac event is 5%.            ASSESSMENT & PLAN:    Shortness of breath Palpitations  Syncope  I would like to rule out a cardiovascular etiology of this palpitation, therefore at this time I would like to placed a zio patch for  7  days. In additon a transthoracic echocardiogram will be ordered to assess LV/RV function and any structural abnormalities. Once these testing have been performed amd reviewed further reccomendations will be made. For now, I do reccomend that the patient goes to the nearest ED if  symptoms recur.    Patient Instructions  Medication Instructions:  Your physician recommends that you continue on your current medications as directed. Please refer to the Current Medication list given to you today.  *If you need  a refill on your cardiac medications before your next appointment, please call your pharmacy*   Lab Work: None   Testing/Procedures: Your physician has requested that you have an echocardiogram. Echocardiography is a painless test that uses sound waves to create images of your heart. It provides your doctor with information about the size and shape of your heart and how well your heart's chambers and valves are working. This procedure takes approximately one hour. There are no restrictions for this procedure. Please do NOT wear cologne, perfume, aftershave, or lotions (deodorant is allowed). Please arrive 15 minutes prior to your appointment time.  ZIO XT- Long Term Monitor Instructions  Your physician has requested you wear a ZIO patch monitor for 14 days.  This is a single patch monitor. [employer] supplies one patch monitor per enrollment. Additional stickers are not available. Please do not apply patch if you will be having a Nuclear Stress Test,  Echocardiogram, Cardiac CT, MRI, or Chest Xray during the period you would be wearing the  monitor. The patch cannot be worn during these tests. You cannot remove and re-apply the  ZIO XT patch monitor.  Your ZIO patch monitor will be mailed 3 day USPS to your address on file. It may take 3-5 days  to receive your monitor after you have been enrolled.  Once you have received your monitor, please review the enclosed instructions. Your monitor  has already been registered assigning a specific monitor serial # to you.  Billing and Patient Assistance Program Information  We have supplied [employer] with any of your insurance information on file for billing purposes. [employer] offers a sliding scale Patient Assistance Program for patients  that do not have  insurance, or whose insurance does not completely cover the cost of the ZIO monitor.  You must apply for the Patient Assistance Program to qualify for this discounted rate.  To apply, please call [employer] at [phone], select option 4, select option 2, ask to apply for  Patient Assistance Program. Meredeth Ide will ask your household income, and how many people  are in your household. They will quote your out-of-pocket cost based on that information.  [employer] will also be able to set up a 105-month, interest-free payment plan if needed.  Applying the monitor   Shave hair from upper left chest.  Hold abrader disc by orange tab. Rub abrader in 40 strokes over the upper left chest as  indicated in your monitor instructions.  Clean area with 4 enclosed alcohol pads. Let dry.  Apply patch as indicated in monitor instructions. Patch will be placed under collarbone on left  side of chest with arrow pointing upward.  Rub patch adhesive wings for 2 minutes. Remove white label marked "1". Remove the white  label marked "2". Rub patch adhesive wings for 2 additional minutes.  While looking in a mirror, press and release button in center of patch. A small green light will  flash 3-4 times. This will be your only indicator that the monitor has been turned on.  Do not shower for the first 24 hours. You may shower after the first 24 hours.  Press the button if you feel a symptom. You will hear a small click. Record Date, Time and  Symptom in the Patient Logbook.  When you are ready to remove the patch, follow instructions on the last 2 pages of Patient  Logbook. Stick patch monitor onto the last page of Patient Logbook.  Place Patient Logbook in the blue and white box.  Use locking tab on box and tape box closed  securely. The blue and white box has prepaid postage on it. Please place it in the mailbox as  soon as possible. Your physician should have your test results approximately 7 days  after the  monitor has been mailed back to [facility].  Call [facility] Customer Care at [phone] if you have questions regarding  your ZIO XT patch monitor. Call them immediately if you see an orange light blinking on your  monitor.  If your monitor falls off in less than 4 days, contact our Monitor department at [phone].  If your monitor becomes loose or falls off after 4 days call [employer] at [phone] for  suggestions on securing your monitor   Follow-Up: At [facility], you and your health needs are our priority.  As part of our continuing mission to provide you with exceptional heart care, we have created designated Provider Care Teams.  These Care Teams include your primary Cardiologist (physician) and Advanced Practice Providers (APPs -  Physician Assistants and Nurse Practitioners) who all work together to provide you with the care you need, when you need it.  We recommend signing up for the patient portal called "MyChart".  Sign up information is provided on this After Visit Summary.  MyChart is used to connect with patients for Virtual Visits (Telemedicine).  Patients are able to view lab/test results, encounter notes, upcoming appointments, etc.  Non-urgent messages can be sent to your provider as well.   To learn more about what you can do with MyChart, go to [email or web address].    Your next appointment:   8 week(s)  Provider:   Thomasene Ripple, DO     Dispo:  No follow-ups on file.   Medication Adjustments/Labs and Tests Ordered: Current medicines are reviewed at length with the patient today.  Concerns regarding medicines are outlined above.  Tests Ordered: Orders Placed This Encounter  Procedures   LONG TERM MONITOR (3-14 DAYS)   EKG 12-Lead   ECHOCARDIOGRAM COMPLETE   Medication Changes: No orders of the defined types were placed in this encounter.

## 2023-06-14 ENCOUNTER — Ambulatory Visit (HOSPITAL_COMMUNITY): Payer: Medicaid Other | Attending: Cardiovascular Disease

## 2023-06-14 DIAGNOSIS — R55 Syncope and collapse: Secondary | ICD-10-CM | POA: Insufficient documentation

## 2023-06-14 LAB — ECHOCARDIOGRAM COMPLETE
Area-P 1/2: 3.21 cm2
Calc EF: 65.2 %
S' Lateral: 2.4 cm
Single Plane A2C EF: 66.4 %
Single Plane A4C EF: 66.5 %

## 2023-06-22 ENCOUNTER — Other Ambulatory Visit: Payer: Self-pay

## 2023-06-22 ENCOUNTER — Inpatient Hospital Stay (HOSPITAL_COMMUNITY)
Admission: AD | Admit: 2023-06-22 | Discharge: 2023-06-22 | Disposition: A | Discharge status: Home / Self Care | Payer: Medicaid Other | Attending: Obstetrics & Gynecology | Admitting: Obstetrics & Gynecology

## 2023-06-22 ENCOUNTER — Encounter (HOSPITAL_COMMUNITY): Payer: Self-pay | Admitting: Obstetrics & Gynecology

## 2023-06-22 DIAGNOSIS — O26892 Other specified pregnancy related conditions, second trimester: Secondary | ICD-10-CM | POA: Diagnosis not present

## 2023-06-22 DIAGNOSIS — O2242 Hemorrhoids in pregnancy, second trimester: Secondary | ICD-10-CM | POA: Insufficient documentation

## 2023-06-22 DIAGNOSIS — Z3A26 26 weeks gestation of pregnancy: Secondary | ICD-10-CM | POA: Diagnosis not present

## 2023-06-22 DIAGNOSIS — R109 Unspecified abdominal pain: Secondary | ICD-10-CM | POA: Diagnosis not present

## 2023-06-22 DIAGNOSIS — O4702 False labor before 37 completed weeks of gestation, second trimester: Secondary | ICD-10-CM | POA: Diagnosis present

## 2023-06-22 DIAGNOSIS — O368130 Decreased fetal movements, third trimester, not applicable or unspecified: Secondary | ICD-10-CM | POA: Diagnosis present

## 2023-06-22 HISTORY — DX: Gestational diabetes mellitus in pregnancy, unspecified control: O24.419

## 2023-06-22 LAB — URINALYSIS, ROUTINE W REFLEX MICROSCOPIC
Bilirubin Urine: NEGATIVE
Glucose, UA: NEGATIVE mg/dL
Hgb urine dipstick: NEGATIVE
Ketones, ur: NEGATIVE mg/dL
Leukocytes,Ua: NEGATIVE
Nitrite: NEGATIVE
Protein, ur: NEGATIVE mg/dL
Specific Gravity, Urine: 1.015 (ref 1.005–1.030)
pH: 7 (ref 5.0–8.0)

## 2023-06-22 MED ORDER — ACETAMINOPHEN 500 MG PO TABS
1000.0000 mg | ORAL_TABLET | Freq: Once | ORAL | Status: AC
Start: 2023-06-22 — End: 2023-06-22
  Administered 2023-06-22: 1000 mg via ORAL
  Filled 2023-06-22: qty 2

## 2023-06-22 MED ORDER — WITCH HAZEL-GLYCERIN EX PADS: 1.0000 | MEDICATED_PAD | CUTANEOUS | 12 refills | Status: AC | PRN

## 2023-06-22 NOTE — MAU Note (Signed)
Katrina Blackwell is a 37 y.o. at [redacted]w[redacted]d here in MAU reporting: ctxs and abd pain since yesterday. Gradually worsened over night. Pt reports she hasn't slept. Reports vaginal/rectal pressure that is coming and going. Last BM yesterday. Feels pressure when she urinates.  Denies any V/D reports she is always nauseated. Denies any recent sexual intercourse.   Denies any LOF or VB. Reports DFM.   Onset of complaint: 06/21/23 Pain score: 7 Vitals:   06/22/23 1033  BP: 106/63  Pulse: (!) 101  Resp: 16  Temp: 98.6 F (37 C)     FHT:151 Lab orders placed from triage:  UA

## 2023-06-22 NOTE — MAU Provider Note (Signed)
History     CSN: [id number]  Arrival date and time: 06/22/23 1012   Event Date/Time   First Provider Initiated Contact with Patient 06/22/23 1056      Chief Complaint  Patient presents with   Contractions   Abdominal Pain   Decreased Fetal Movement   HPI  Ms.Katrina Blackwell is a 37 y.o. Female G3O7564 @ [redacted]w[redacted]d here with lower abdominal pain/ contractions. No history of preterm delivery or preterm labor with previous pregnancies. She had vomiting 2 days ago which has been normal for her this pregnancy. Yesterday she reports contractions and abdominal pain. The contractions continued this morning. No vaginal bleeding, no recent intercourse. Currently rates her pain 7/10. Has not taken anything for the pain.   + fetal movement since arrival to MAU.   OB History     Gravida  3   Para  2   Term  2   Preterm      AB      Living  2      SAB      IAB      Ectopic      Multiple  0   Live Births  2           Past Medical History:  Diagnosis Date   Gestational diabetes    Headache(784.0)    MIGRAINES;OTC   Language barrier 05/12/2013   Burke Keels, declines interpreter.    Migraines 05/11/2013    Past Surgical History:  Procedure Laterality Date   IR EMBO VENOUS NOT HEMORR HEMANG  INC GUIDE ROADMAPPING  02/07/2022   IR RADIOLOGIST EVAL & MGMT  01/01/2022   IR RADIOLOGIST EVAL & MGMT  03/09/2022   IR US GUIDE VASC ACCESS RIGHT  02/07/2022   IR VENOGRAM RENAL UNI LEFT  02/07/2022   TONSILLECTOMY  2009    Family History  Problem Relation Age of Onset   Hyperlipidemia Mother    Diabetes Father    Heart disease Father    Other Neg Hx     Social History   Tobacco Use   Smoking status: Former    Types: Cigarettes   Smokeless tobacco: Never  Vaping Use   Vaping status: Never Used  Substance Use Topics   Alcohol use: No   Drug use: No    Allergies: No Known Allergies  Medications Prior to Admission  Medication Sig Dispense Refill Last Dose    cholecalciferol (VITAMIN D3) 25 MCG (1000 UNIT) tablet Take 1,000 Units by mouth daily.   Past Month   famotidine (PEPCID) 20 MG tablet Take 1 tablet (20 mg total) by mouth daily. 30 tablet 11 06/21/2023   ondansetron (ZOFRAN ODT) 4 MG disintegrating tablet Take 1 tablet (4 mg total) by mouth every 8 (eight) hours as needed for nausea or vomiting. 10 tablet 0 06/21/2023   Prenatal Vit-Fe Fumarate-FA (PRENATAL MULTIVITAMIN) TABS tablet Take 1 tablet by mouth daily at 12 noon.   06/21/2023   acetaminophen (TYLENOL) 500 MG tablet Take 500 mg by mouth every 6 (six) hours as needed for moderate pain or headache.   Unknown   benzonatate (TESSALON) 200 MG capsule Take 1 capsule (200 mg total) by mouth 2 (two) times daily as needed for cough. (Patient not taking: Reported on 03/15/2023) 30 capsule 0    clobetasol ointment (TEMOVATE) 0.05 % Apply to scalp 2-3 times per week. Not to face.      cyanocobalamin (VITAMIN B12) 1000 MCG/ML injection Inject 1 mL every month by  subcutaneous route.   Unknown   fluconazole (DIFLUCAN) 150 MG tablet Take 150 mg by mouth every 3 (three) days.   Unknown   phenazopyridine (PYRIDIUM) 200 MG tablet Take 1 tablet (200 mg total) by mouth 3 (three) times daily as needed for pain. (Patient not taking: Reported on 03/15/2023) 9 tablet 0    Prenat-FeAsp-Meth-FA-DHA w/o A (PRENATE PIXIE) 10-0.6-0.4-200 MG CAPS Take 1 tablet by mouth daily. 30 capsule 12    promethazine (PHENERGAN) 25 MG suppository Place 25 mg rectally every 6 (six) hours as needed for nausea or vomiting.   Unknown   Results for orders placed or performed during the hospital encounter of 06/22/23 (from the past 48 hour(s))  Urinalysis, Routine w reflex microscopic -Urine, Clean Catch     Status: None   Collection Time: 06/22/23 10:41 AM  Result Value Ref Range   Color, Urine YELLOW YELLOW   APPearance CLEAR CLEAR   Specific Gravity, Urine 1.015 1.005 - 1.030   pH 7.0 5.0 - 8.0   Glucose, UA NEGATIVE NEGATIVE mg/dL    Hgb urine dipstick NEGATIVE NEGATIVE   Bilirubin Urine NEGATIVE NEGATIVE   Ketones, ur NEGATIVE NEGATIVE mg/dL   Protein, ur NEGATIVE NEGATIVE mg/dL   Nitrite NEGATIVE NEGATIVE   Leukocytes,Ua NEGATIVE NEGATIVE    Comment: Performed at [facility], [address], [place], [place] [address]     Review of Systems  Constitutional:  Negative for fever.  Gastrointestinal:  Positive for abdominal pain, nausea and vomiting. Negative for constipation and diarrhea.   Physical Exam   Blood pressure 106/63, pulse (!) 101, temperature 98.6 F (37 C), temperature source Oral, resp. rate 16.  Physical Exam Vitals and nursing note reviewed.  Constitutional:      General: She is not in acute distress.    Appearance: She is well-developed. She is not ill-appearing, toxic-appearing or diaphoretic.  HENT:     Head: Normocephalic.  Eyes:     Pupils: Pupils are equal, round, and reactive to light.  Abdominal:     Tenderness: There is no abdominal tenderness.  Genitourinary:    Comments: Dilation: Closed Effacement (%): Thick Cervical Position: Posterior Exam by:: Shela Commons Sirr Kabel NP  Skin:    General: Skin is warm.  Neurological:     Mental Status: She is alert and oriented to person, place, and time.  Psychiatric:        Behavior: Behavior normal.    Fetal Tracing: Baseline: 145 bpm Variability: Moderate  Accelerations: 10x10 Decelerations: none Toco: none  MAU Course  Procedures  MDM  Tylenol given, pain improved Reactive NST   Assessment and Plan   A:  1. Abdominal pain during pregnancy in second trimester   2. [redacted] weeks gestation of pregnancy   3. Hemorrhoids during pregnancy in second trimester      P:  Dc home Rx: tucks pads Increase oral fluid intake Return if symptoms worsen  Venia Carbon I, NP 06/22/2023 11:57 AM

## 2023-06-25 DIAGNOSIS — R760 Raised antibody titer: Secondary | ICD-10-CM | POA: Diagnosis not present

## 2023-06-27 ENCOUNTER — Encounter (HOSPITAL_COMMUNITY): Payer: Self-pay | Admitting: Obstetrics and Gynecology

## 2023-06-27 ENCOUNTER — Inpatient Hospital Stay (HOSPITAL_COMMUNITY)
Admission: AD | Admit: 2023-06-27 | Discharge: 2023-06-27 | Disposition: A | Discharge status: Home / Self Care | Payer: Medicaid Other | Attending: Obstetrics and Gynecology | Admitting: Obstetrics and Gynecology

## 2023-06-27 ENCOUNTER — Inpatient Hospital Stay (HOSPITAL_BASED_OUTPATIENT_CLINIC_OR_DEPARTMENT_OTHER): Payer: Medicaid Other

## 2023-06-27 DIAGNOSIS — R1031 Right lower quadrant pain: Secondary | ICD-10-CM | POA: Insufficient documentation

## 2023-06-27 DIAGNOSIS — O9A212 Injury, poisoning and certain other consequences of external causes complicating pregnancy, second trimester: Secondary | ICD-10-CM

## 2023-06-27 DIAGNOSIS — Z3A27 27 weeks gestation of pregnancy: Secondary | ICD-10-CM | POA: Diagnosis not present

## 2023-06-27 DIAGNOSIS — W108XXA Fall (on) (from) other stairs and steps, initial encounter: Secondary | ICD-10-CM | POA: Diagnosis not present

## 2023-06-27 DIAGNOSIS — O99891 Other specified diseases and conditions complicating pregnancy: Secondary | ICD-10-CM

## 2023-06-27 DIAGNOSIS — Z789 Other specified health status: Secondary | ICD-10-CM | POA: Diagnosis not present

## 2023-06-27 DIAGNOSIS — M542 Cervicalgia: Secondary | ICD-10-CM | POA: Diagnosis present

## 2023-06-27 DIAGNOSIS — R002 Palpitations: Secondary | ICD-10-CM | POA: Diagnosis not present

## 2023-06-27 DIAGNOSIS — Y92008 Other place in unspecified non-institutional (private) residence as the place of occurrence of the external cause: Secondary | ICD-10-CM | POA: Insufficient documentation

## 2023-06-27 DIAGNOSIS — K219 Gastro-esophageal reflux disease without esophagitis: Secondary | ICD-10-CM | POA: Diagnosis not present

## 2023-06-27 DIAGNOSIS — W19XXXA Unspecified fall, initial encounter: Secondary | ICD-10-CM | POA: Diagnosis not present

## 2023-06-27 DIAGNOSIS — R1030 Lower abdominal pain, unspecified: Secondary | ICD-10-CM | POA: Diagnosis not present

## 2023-06-27 DIAGNOSIS — M5489 Other dorsalgia: Secondary | ICD-10-CM | POA: Insufficient documentation

## 2023-06-27 DIAGNOSIS — M549 Dorsalgia, unspecified: Secondary | ICD-10-CM

## 2023-06-27 DIAGNOSIS — G4489 Other headache syndrome: Secondary | ICD-10-CM | POA: Diagnosis not present

## 2023-06-27 MED ORDER — LACTATED RINGERS IV BOLUS
1000.0000 mL | Freq: Once | INTRAVENOUS | Status: AC
  Administered 2023-06-27: 1000 mL via INTRAVENOUS

## 2023-06-27 MED ORDER — ACETAMINOPHEN-CAFFEINE 500-65 MG PO TABS: 1.0000 | ORAL_TABLET | Freq: Three times a day (TID) | ORAL | 0 refills | Status: DC | PRN

## 2023-06-27 MED ORDER — PANTOPRAZOLE SODIUM 20 MG PO TBEC
20.0000 mg | DELAYED_RELEASE_TABLET | Freq: Once | ORAL | Status: AC
  Administered 2023-06-27: 20 mg via ORAL
  Filled 2023-06-27: qty 1

## 2023-06-27 MED ORDER — ACETAMINOPHEN-CAFFEINE 500-65 MG PO TABS
2.0000 | ORAL_TABLET | Freq: Once | ORAL | Status: AC
Start: 2023-06-27 — End: 2023-06-27
  Administered 2023-06-27: 2 via ORAL
  Filled 2023-06-27: qty 2

## 2023-06-27 MED ORDER — CYCLOBENZAPRINE HCL 5 MG PO TABS
10.0000 mg | ORAL_TABLET | Freq: Once | ORAL | Status: AC
  Administered 2023-06-27: 10 mg via ORAL
  Filled 2023-06-27: qty 2

## 2023-06-27 MED ORDER — PANTOPRAZOLE SODIUM 20 MG PO TBEC: 20.0000 mg | DELAYED_RELEASE_TABLET | Freq: Every day | ORAL | 1 refills | Status: AC

## 2023-06-27 MED ORDER — ONDANSETRON HCL 4 MG/2ML IJ SOLN
4.0000 mg | Freq: Once | INTRAMUSCULAR | Status: AC
  Administered 2023-06-27: 4 mg via INTRAVENOUS
  Filled 2023-06-27: qty 2

## 2023-06-27 MED ORDER — CYCLOBENZAPRINE HCL 5 MG PO TABS: 5.0000 mg | ORAL_TABLET | Freq: Three times a day (TID) | ORAL | 0 refills | Status: AC | PRN

## 2023-06-27 MED ORDER — ALUM & MAG HYDROXIDE-SIMETH 200-200-20 MG/5ML PO SUSP
30.0000 mL | Freq: Once | ORAL | Status: AC
  Administered 2023-06-27: 30 mL via ORAL
  Filled 2023-06-27: qty 30

## 2023-06-27 NOTE — MAU Note (Signed)
..  Katrina Blackwell is a 37 y.o. at [redacted]w[redacted]d here in MAU reporting: around 1430 she fell down x2 stairs inside of her home. States since the she is having intermittent pain in her RLQ, constant pain in the left side of her neck that radiates to her shoulder, and pain that shoots from her head down her back. Denies head and abdominal trauma. No VB or LOF. +FM.   Pain score: head- 9    abdomen- 8      neck- 7 Vitals:   06/27/23 1514  BP: 113/83  Pulse: 100  Resp: 18  Temp: 98.1 F (36.7 C)  SpO2: 100%     FHT:145 Lab orders placed from triage:   none

## 2023-06-27 NOTE — MAU Provider Note (Addendum)
Chief Complaint:  Fall   HPI   Event Date/Time   First Provider Initiated Contact with Patient 06/27/23 1543      Katrina Blackwell is a 37 y.o. G3P2002 at [redacted]w[redacted]d who presents to maternity admissions reporting via EMS for fall at home around 2:30pm this afternoon.  States she had awoken feeling nauseated all morning and vomiting.  She states that she felt lightheaded but when she went to walk downstairs simply missed a step and fell to her bottom and slid down 4 individual stairs on her R hip.  Denies direct abdominal trauma but having RLQ pain and L sided neck pain so decided to come to be evaluated. She believes neck pain is spasm from reaching out quickly to try and catch herself whilst falling.  Denies CP, SOB, palpitations, Cspine pain or TTP, weakness / paraesthesias, VB, LOF.  Reports positive fetal movement.  Believes she is dehydrated as PO intake has been poor over last couple of days.     Of note, upon chart review:  Syncope of Pregnancy: 05/17/23 EKG = reported sinus tachycardia , HR 104bpm per cardiology note  Long Term Holter reported as normal as well per Dr. Servando Salina message to patient on 7/26 06/14/23 [facility] [place], [address] = EF 65-70%, normal LV/RV function, no valvular concerns  Patient denies presyncopal like symptoms at the time of fall and currently.     Pregnancy Course: [facility] is primary provider  Patient Active Problem List   Diagnosis Date Noted   Traumatic injury during pregnancy in second trimester 06/27/2023   Abnormal glucose tolerance test 12/31/2017     Past Medical History:  Diagnosis Date   Gestational diabetes    Headache(784.0)    MIGRAINES;OTC   Language barrier 05/12/2013   Burke Keels, declines interpreter.    Migraines 05/11/2013   OB History  Gravida Para Term Preterm AB Living  3 2 2     2   SAB IAB Ectopic Multiple Live Births        0 2    # Outcome Date GA Lbr Len/2nd Weight Sex Type Anes PTL Lv  3 Current           2 Term  09/09/17 [redacted]w[redacted]d 14:14 / 01:40 2821 g M Vag-Spont EPI  LIV  1 Term 05/12/13 [redacted]w[redacted]d 06:39 / 07:41 2807 g M Vag-Spont None  LIV     Birth Comments: No problems at birth   Past Surgical History:  Procedure Laterality Date   IR EMBO VENOUS NOT HEMORR HEMANG  INC GUIDE ROADMAPPING  02/07/2022   IR RADIOLOGIST EVAL & MGMT  01/01/2022   IR RADIOLOGIST EVAL & MGMT  03/09/2022   IR US GUIDE VASC ACCESS RIGHT  02/07/2022   IR VENOGRAM RENAL UNI LEFT  02/07/2022   TONSILLECTOMY  2009   Family History  Problem Relation Age of Onset   Hyperlipidemia Mother    Diabetes Father    Heart disease Father    Other Neg Hx    Social History   Tobacco Use   Smoking status: Former    Types: Cigarettes   Smokeless tobacco: Never  Vaping Use   Vaping status: Never Used  Substance Use Topics   Alcohol use: No   Drug use: No   No Known Allergies Medications Prior to Admission  Medication Sig Dispense Refill Last Dose   famotidine (PEPCID) 20 MG tablet Take 1 tablet (20 mg total) by mouth daily. 30 tablet 11 06/26/2023   ondansetron (ZOFRAN  ODT) 4 MG disintegrating tablet Take 1 tablet (4 mg total) by mouth every 8 (eight) hours as needed for nausea or vomiting. 10 tablet 0 06/27/2023   Prenatal Vit-Fe Fumarate-FA (PRENATAL MULTIVITAMIN) TABS tablet Take 1 tablet by mouth daily at 12 noon.   06/26/2023   acetaminophen (TYLENOL) 500 MG tablet Take 500 mg by mouth every 6 (six) hours as needed for moderate pain or headache.      cholecalciferol (VITAMIN D3) 25 MCG (1000 UNIT) tablet Take 1,000 Units by mouth daily.      clobetasol ointment (TEMOVATE) 0.05 % Apply to scalp 2-3 times per week. Not to face.      cyanocobalamin (VITAMIN B12) 1000 MCG/ML injection Inject 1 mL every month by subcutaneous route.      fluconazole (DIFLUCAN) 150 MG tablet Take 150 mg by mouth every 3 (three) days.      promethazine (PHENERGAN) 25 MG suppository Place 25 mg rectally every 6 (six) hours as needed for nausea or vomiting.       witch hazel-glycerin (TUCKS) pad Apply 1 Application topically as needed for itching. 40 each 12     I have reviewed patient's Past Medical Hx, Surgical Hx, Family Hx, Social Hx, medications and allergies.   ROS  Pertinent items noted in HPI and remainder of comprehensive ROS otherwise negative.   PHYSICAL EXAM  Patient Vitals for the past 24 hrs:  BP Temp Temp src Pulse Resp SpO2  06/27/23 1514 113/83 98.1 F (36.7 C) Oral 100 18 100 %    Constitutional: Well-developed, well-nourished female in no acute distress.  Cardiovascular: normal rate & rhythm, warm and well-perfused Respiratory: normal effort, no problems with respiration noted GI: Abd soft, non-tender, non-distended MS: Extremities nontender, no edema, normal ROM Neurologic: Alert and oriented x 4.  GU: no CVA tenderness Pelvic: NEFG, physiologic discharge, no blood, cervix clean.      Fetal Tracing: Baseline: 130bpm Variability: moderate  Accelerations: + Decelerations: variable  Toco: no CTX    Blackwell: No results found for this or any previous visit (from the past 24 hour(s)).  Imaging:  [place] MFM OB LIMITED  Result Date: 06/27/2023 ----------------------------------------------------------------------  OBSTETRICS REPORT                       (Signed Final 06/27/2023 05:11 pm) ---------------------------------------------------------------------- Patient Info  ID #:       [id number]                          D.O.B.:  [birth year] (37 yrs)  Name:       Katrina Blackwell                    Visit Date: 06/27/2023 04:46 pm ---------------------------------------------------------------------- Performed By  Attending:        Noralee Space MD        Secondary Phy.:   [facility] MAU/Triage  Performed By:     Isabel Caprice        [facility]  Referred By:      Baird Lyons  C CHUBB ---------------------------------------------------------------------- Orders  #   Description                           Code        Ordered By  1  [place] MFM OB LIMITED                     [id number]    CASEY CHUBB ----------------------------------------------------------------------  #  Order #                     Accession #                Episode #  1  [id number]                   [id number]                 [id number] ---------------------------------------------------------------------- Indications  Traumatic injury during pregnancy              O9A.219 T14.90  [redacted] weeks gestation of pregnancy                Z3A.27 ---------------------------------------------------------------------- Fetal Evaluation  Num Of Fetuses:         1  Fetal Heart Rate(bpm):  149  Cardiac Activity:       Observed  Presentation:           Cephalic  Placenta:               Anterior  P. Cord Insertion:      Visualized, central  Amniotic Fluid  AFI FV:      Within normal limits                              Largest Pocket(cm)                              5.34  Comment:    No placental abruption or previa identified. ---------------------------------------------------------------------- OB History  Blood Type:   O+  Maternal Racial/Ethnic Group:   Black (non-Hispanic)  Gravidity:    3         Term:   2        Prem:   0        SAB:   0  TOP:          0       Ectopic:  0        Living: 2 ---------------------------------------------------------------------- Gestational Age  Best:          27w 0d     Det. ByMarcella Dubs         EDD:   09/26/23                                      (02/14/23) ---------------------------------------------------------------------- Anatomy  Diaphragm:             Appears normal         Kidneys:                Appear normal  Stomach:               Appears normal, left   Bladder:  Appears normal                         sided ---------------------------------------------------------------------- Cervix Uterus Adnexa  Cervix  Not visualized (advanced GA >24wks)  Adnexa  No abnormality  visualized ---------------------------------------------------------------------- Impression  History of fall.  A limited ultrasound study was performed .Amniotic fluid is  normal and good fetal activity is seen.  Placenta looks normal  with no evidence of abruption. Ultrasound has limitations in  diagnosing placental abruption . ----------------------------------------------------------------------                 Noralee Space, MD Electronically Signed Final Report   06/27/2023 05:11 pm ----------------------------------------------------------------------    MDM & MAU COURSE  MDM: moderate  MAU Course: Orders Placed This Encounter  Procedures   [place] MFM OB LIMITED   Diet regular Room service appropriate? Yes; Fluid consistency: Thin   Meds ordered this encounter  Medications   lactated ringers bolus 1,000 mL   acetaminophen-caffeine (EXCEDRIN TENSION HEADACHE) 500-65 MG per tablet 2 tablet   cyclobenzaprine (FLEXERIL) tablet 10 mg   ondansetron (ZOFRAN) injection 4 mg   lactated ringers bolus 1,000 mL   cyclobenzaprine (FLEXERIL) 5 MG tablet    Sig: Take 1 tablet (5 mg total) by mouth 3 (three) times daily as needed for up to 7 days for muscle spasms.    Dispense:  21 tablet    Refill:  0   acetaminophen-caffeine (EXCEDRIN TENSION HEADACHE) 500-65 MG TABS per tablet    Sig: Take 1 tablet by mouth every 8 (eight) hours as needed.    Dispense:  60 tablet    Refill:  0    Patient arrived via EMS and appears stable, HDS, VSS, SORA, Alert and oriented x 3.  Patient complaining of L sided muscular neck pain despite full ROM and R sided abdominal pain after saying she fell on R hip and slid down the stairs.  Given HPI and exam positive for TTP concern for ?abruption.  Ordered formal US which showed reassuring fetal status as well as NO abruption.  Patient also given flexeril and Excedrin with improvement in both HA and neck spasm.  Was also improving with 1L LR.  Will give another liter as  awaiting full 4 hr continued fetal monitoring.  Will plan to discharge home if continues to improve s/p fluids and fetal monitoring remains reassuring. Sending with flexeril and tylenol scripts.   ASSESSMENT   1. Traumatic injury during pregnancy in second trimester   2. [redacted] weeks gestation of pregnancy     PLAN  Discharge home in stable condition. F/u with [facility] for ED follow up appointment.      Allergies as of 06/27/2023   No Known Allergies      Medication List     TAKE these medications    acetaminophen 500 MG tablet Commonly known as: TYLENOL Take 500 mg by mouth every 6 (six) hours as needed for moderate pain or headache.   acetaminophen-caffeine 500-65 MG Tabs per tablet Commonly known as: EXCEDRIN TENSION HEADACHE Take 1 tablet by mouth every 8 (eight) hours as needed.   cholecalciferol 25 MCG (1000 UNIT) tablet Commonly known as: VITAMIN D3 Take 1,000 Units by mouth daily.   clobetasol ointment 0.05 % Commonly known as: TEMOVATE Apply to scalp 2-3 times per week. Not to face.   cyanocobalamin 1000 MCG/ML injection Commonly known as: VITAMIN B12 Inject 1 mL every month by subcutaneous route.   cyclobenzaprine  5 MG tablet Commonly known as: FLEXERIL Take 1 tablet (5 mg total) by mouth 3 (three) times daily as needed for up to 7 days for muscle spasms.   famotidine 20 MG tablet Commonly known as: Pepcid Take 1 tablet (20 mg total) by mouth daily.   fluconazole 150 MG tablet Commonly known as: DIFLUCAN Take 150 mg by mouth every 3 (three) days.   ondansetron 4 MG disintegrating tablet Commonly known as: Zofran ODT Take 1 tablet (4 mg total) by mouth every 8 (eight) hours as needed for nausea or vomiting.   prenatal multivitamin Tabs tablet Take 1 tablet by mouth daily at 12 noon.   promethazine 25 MG suppository Commonly known as: PHENERGAN Place 25 mg rectally every 6 (six) hours as needed for nausea or vomiting.   witch hazel-glycerin pad Commonly  known as: TUCKS Apply 1 Application topically as needed for itching.       Mittie Bodo, MD Family Medicine - Obstetrics Fellow    Fetal heart rate remained reassuring Headache and back pain improved States would like Rx for meds for reflux and headache  A:  Single IUP at [redacted]w[redacted]d       Status post fall       Back pain      Headache      Acid reflux  P:  discharge home      Rx Protonix for reflux      Rx Flexeril and Excedrin Tension for pain     Followup in office     Encouraged to return if she develops worsening of symptoms, increase in pain, fever, or other concerning symptoms.   Aviva Signs, CNM

## 2023-07-01 DIAGNOSIS — R55 Syncope and collapse: Secondary | ICD-10-CM | POA: Diagnosis not present

## 2023-07-01 DIAGNOSIS — Z369 Encounter for antenatal screening, unspecified: Secondary | ICD-10-CM | POA: Diagnosis not present

## 2023-07-05 ENCOUNTER — Ambulatory Visit (INDEPENDENT_AMBULATORY_CARE_PROVIDER_SITE_OTHER): Payer: Medicaid Other | Admitting: Cardiology

## 2023-07-05 ENCOUNTER — Encounter: Payer: Self-pay | Admitting: Cardiology

## 2023-07-05 VITALS — BP 118/80 | HR 121 | Ht 66.0 in | Wt 168.9 lb

## 2023-07-05 DIAGNOSIS — O24414 Gestational diabetes mellitus in pregnancy, insulin controlled: Secondary | ICD-10-CM

## 2023-07-05 DIAGNOSIS — Z3A28 28 weeks gestation of pregnancy: Secondary | ICD-10-CM | POA: Diagnosis not present

## 2023-07-05 DIAGNOSIS — O09529 Supervision of elderly multigravida, unspecified trimester: Secondary | ICD-10-CM

## 2023-07-05 DIAGNOSIS — R55 Syncope and collapse: Secondary | ICD-10-CM

## 2023-07-05 NOTE — Patient Instructions (Signed)
Medication Instructions:  Your physician recommends that you continue on your current medications as directed. Please refer to the Current Medication list given to you today.  *If you need a refill on your cardiac medications before your next appointment, please call your pharmacy*   Lab Work: None ordered   If you have labs (blood work) drawn today and your tests are completely normal, you will receive your results only by: MyChart Message (if you have MyChart) OR A paper copy in the mail If you have any lab test that is abnormal or we need to change your treatment, we will call you to review the results.   Testing/Procedures: None ordered    Follow-Up: At [facility], you and your health needs are our priority.  As part of our continuing mission to provide you with exceptional heart care, we have created designated Provider Care Teams.  These Care Teams include your primary Cardiologist (physician) and Advanced Practice Providers (APPs -  Physician Assistants and Nurse Practitioners) who all work together to provide you with the care you need, when you need it.  We recommend signing up for the patient portal called "MyChart".  Sign up information is provided on this After Visit Summary.  MyChart is used to connect with patients for Virtual Visits (Telemedicine).  Patients are able to view lab/test results, encounter notes, upcoming appointments, etc.  Non-urgent messages can be sent to your provider as well.   To learn more about what you can do with MyChart, go to [email or web address].    Your next appointment:   8 week(s)  Provider:   Dr. Ahmed Prima at the [facility] office [facility] to double book)   Other Instructions

## 2023-07-07 DIAGNOSIS — O24414 Gestational diabetes mellitus in pregnancy, insulin controlled: Secondary | ICD-10-CM | POA: Insufficient documentation

## 2023-07-07 DIAGNOSIS — R55 Syncope and collapse: Secondary | ICD-10-CM | POA: Insufficient documentation

## 2023-07-07 NOTE — Progress Notes (Signed)
Cardio-Obstetrics Clinic  New Evaluation  Date:  07/07/2023   ID:  Katrina Blackwell, DOB [birth year], MRN [id number]  PCP:  Patient, No Pcp Per   [facility] Providers Cardiologist:  Thomasene Ripple, DO  Electrophysiologist:  None       Referring MD: No ref. provider found   Chief Complaint: " I am   History of Present Illness:    Katrina Blackwell is a 37 y.o. female [G3P2002] who is being seen today in follow up for syncope episode.  At her last visit she reported that she had been experiencing intermittent syncope episodes.  I placed a monitor on the patient to get an echocardiogram.  All of this testing were all normal.  Since I saw the patient she has had these episodes again.  But at that time was noted to have hypoglycemia diagnosed with gestational diabetes and iron deficiency anemia.   Prior CV Studies Reviewed: The following studies were reviewed today: Reviewed echo and zio monitor   Past Medical History:  Diagnosis Date   Gestational diabetes    Headache(784.0)    MIGRAINES;OTC   Language barrier 05/12/2013   Speaks english, declines interpreter.    Migraines 05/11/2013    Past Surgical History:  Procedure Laterality Date   IR EMBO VENOUS NOT HEMORR HEMANG  INC GUIDE ROADMAPPING  02/07/2022   IR RADIOLOGIST EVAL & MGMT  01/01/2022   IR RADIOLOGIST EVAL & MGMT  03/09/2022   IR US GUIDE VASC ACCESS RIGHT  02/07/2022   IR VENOGRAM RENAL UNI LEFT  02/07/2022   TONSILLECTOMY  2009      OB History     Gravida  3   Para  2   Term  2   Preterm      AB      Living  2      SAB      IAB      Ectopic      Multiple  0   Live Births  2               Current Medications: Current Meds  Medication Sig   acetaminophen (TYLENOL) 500 MG tablet Take 500 mg by mouth every 6 (six) hours as needed for moderate pain or headache.   acetaminophen-caffeine (EXCEDRIN TENSION HEADACHE) 500-65 MG TABS per tablet Take 1 tablet by mouth every 8 (eight) hours as  needed.   cholecalciferol (VITAMIN D3) 25 MCG (1000 UNIT) tablet Take 1,000 Units by mouth daily.   clobetasol ointment (TEMOVATE) 0.05 % Apply to scalp 2-3 times per week. Not to face.   cyanocobalamin (VITAMIN B12) 1000 MCG/ML injection Inject 1 mL every month by subcutaneous route.   famotidine (PEPCID) 20 MG tablet Take 1 tablet (20 mg total) by mouth daily.   fluconazole (DIFLUCAN) 150 MG tablet Take 150 mg by mouth every 3 (three) days.   ondansetron (ZOFRAN ODT) 4 MG disintegrating tablet Take 1 tablet (4 mg total) by mouth every 8 (eight) hours as needed for nausea or vomiting.   pantoprazole (PROTONIX) 20 MG tablet Take 1 tablet (20 mg total) by mouth daily.   Prenatal Vit-Fe Fumarate-FA (PRENATAL MULTIVITAMIN) TABS tablet Take 1 tablet by mouth daily at 12 noon.   promethazine (PHENERGAN) 25 MG suppository Place 25 mg rectally every 6 (six) hours as needed for nausea or vomiting.   witch hazel-glycerin (TUCKS) pad Apply 1 Application topically as needed for itching.     Allergies:   Patient has no known  allergies.   Social History   Socioeconomic History   Marital status: Married    Spouse name: [name]   Number of children: Not on file   Years of education: 16   Highest education level: Not on file  Occupational History   Occupation: HOMEMAKER-STUDENT  Tobacco Use   Smoking status: Former    Types: Cigarettes   Smokeless tobacco: Never  Vaping Use   Vaping status: Never Used  Substance and Sexual Activity   Alcohol use: No   Drug use: No   Sexual activity: Yes    Partners: Male  Other Topics Concern   Not on file  Social History Narrative   Not on file   Social Determinants of Health   Financial Resource Strain: Not on file  Food Insecurity: Not on file  Transportation Needs: Not on file  Physical Activity: Not on file  Stress: Not on file  Social Connections: Not on file      Family History  Problem Relation Age of Onset   Hyperlipidemia Mother     Diabetes Father    Heart disease Father    Other Neg Hx       ROS:   Please see the history of present illness.     All other systems reviewed and are negative.   Labs/EKG Reviewed:    EKG:   EKG is was ordered today.  The ekg ordered today demonstrates Sinus tachycardia, HR 104 bpm  Recent Labs: No results found for requested labs within last 365 days.   Recent Lipid Panel No results found for: "CHOL", "TRIG", "HDL", "CHOLHDL", "LDLCALC", "LDLDIRECT"  Physical Exam:    VS:  BP 118/80 (BP Location: Left Arm, Patient Position: Sitting, Cuff Size: Normal)   Pulse (!) 121   Ht 5\' 6"  (1.676 m)   Wt 168 lb 14.4 oz (76.6 kg)   SpO2 96%   BMI 27.26 kg/m     Wt Readings from Last 3 Encounters:  07/05/23 168 lb 14.4 oz (76.6 kg)  05/17/23 160 lb 6.4 oz (72.8 kg)  03/15/23 152 lb (68.9 kg)     GEN:  Well nourished, well developed in no acute distress HEENT: Normal NECK: No JVD; No carotid bruits LYMPHATICS: No lymphadenopathy CARDIAC: RRR, no murmurs, rubs, gallops RESPIRATORY:  Clear to auscultation without rales, wheezing or rhonchi  ABDOMEN: Soft, non-tender, non-distended MUSCULOSKELETAL:  No edema; No deformity  SKIN: Warm and dry NEUROLOGIC:  Alert and oriented x 3 PSYCHIATRIC:  Normal affect    Risk Assessment/Risk Calculators:                  ASSESSMENT & PLAN:    Gestational diabetes Syncope in the setting of hypoglycemia  From a CV standpoint she is doing well. Etiology of her syncope was hypoglycemia.  Will monitor closely.   Patient Instructions  Medication Instructions:  Your physician recommends that you continue on your current medications as directed. Please refer to the Current Medication list given to you today.  *If you need a refill on your cardiac medications before your next appointment, please call your pharmacy*   Lab Work: None ordered   If you have labs (blood work) drawn today and your tests are completely normal, you  will receive your results only by: MyChart Message (if you have MyChart) OR A paper copy in the mail If you have any lab test that is abnormal or we need to change your treatment, we will call you to review the  results.   Testing/Procedures: None ordered    Follow-Up: At [facility], you and your health needs are our priority.  As part of our continuing mission to provide you with exceptional heart care, we have created designated Provider Care Teams.  These Care Teams include your primary Cardiologist (physician) and Advanced Practice Providers (APPs -  Physician Assistants and Nurse Practitioners) who all work together to provide you with the care you need, when you need it.  We recommend signing up for the patient portal called "MyChart".  Sign up information is provided on this After Visit Summary.  MyChart is used to connect with patients for Virtual Visits (Telemedicine).  Patients are able to view lab/test results, encounter notes, upcoming appointments, etc.  Non-urgent messages can be sent to your provider as well.   To learn more about what you can do with MyChart, go to [email or web address].    Your next appointment:   8 week(s)  Provider:   Dr. Ahmed Prima at the [facility] office [facility] to double book)   Other Instructions     Dispo:  No follow-ups on file.   Medication Adjustments/Labs and Tests Ordered: Current medicines are reviewed at length with the patient today.  Concerns regarding medicines are outlined above.  Tests Ordered: No orders of the defined types were placed in this encounter.  Medication Changes: No orders of the defined types were placed in this encounter.

## 2023-07-19 ENCOUNTER — Encounter: Payer: Self-pay | Admitting: *Deleted

## 2023-07-19 DIAGNOSIS — Z148 Genetic carrier of other disease: Secondary | ICD-10-CM | POA: Insufficient documentation

## 2023-07-19 DIAGNOSIS — O09529 Supervision of elderly multigravida, unspecified trimester: Secondary | ICD-10-CM | POA: Insufficient documentation

## 2023-07-26 ENCOUNTER — Ambulatory Visit: Payer: Medicaid Other | Attending: Obstetrics and Gynecology

## 2023-07-26 ENCOUNTER — Ambulatory Visit: Payer: Medicaid Other | Admitting: *Deleted

## 2023-07-26 VITALS — BP 116/75 | HR 97

## 2023-07-26 DIAGNOSIS — Z148 Genetic carrier of other disease: Secondary | ICD-10-CM | POA: Insufficient documentation

## 2023-07-26 DIAGNOSIS — O24414 Gestational diabetes mellitus in pregnancy, insulin controlled: Secondary | ICD-10-CM | POA: Diagnosis not present

## 2023-07-26 DIAGNOSIS — O09523 Supervision of elderly multigravida, third trimester: Secondary | ICD-10-CM

## 2023-07-26 DIAGNOSIS — Z3A31 31 weeks gestation of pregnancy: Secondary | ICD-10-CM | POA: Diagnosis not present

## 2023-07-26 DIAGNOSIS — O09522 Supervision of elderly multigravida, second trimester: Secondary | ICD-10-CM | POA: Diagnosis present

## 2023-07-29 ENCOUNTER — Other Ambulatory Visit: Payer: Self-pay | Admitting: *Deleted

## 2023-07-29 DIAGNOSIS — O24419 Gestational diabetes mellitus in pregnancy, unspecified control: Secondary | ICD-10-CM

## 2023-07-29 DIAGNOSIS — O09523 Supervision of elderly multigravida, third trimester: Secondary | ICD-10-CM

## 2023-08-01 ENCOUNTER — Ambulatory Visit: Payer: BLUE CROSS/BLUE SHIELD

## 2023-08-01 ENCOUNTER — Other Ambulatory Visit: Payer: BLUE CROSS/BLUE SHIELD

## 2023-08-02 ENCOUNTER — Ambulatory Visit (HOSPITAL_BASED_OUTPATIENT_CLINIC_OR_DEPARTMENT_OTHER): Payer: Medicaid Other

## 2023-08-02 ENCOUNTER — Ambulatory Visit: Payer: Medicaid Other | Attending: Obstetrics and Gynecology | Admitting: *Deleted

## 2023-08-02 VITALS — BP 125/75 | HR 102

## 2023-08-02 DIAGNOSIS — Z3A32 32 weeks gestation of pregnancy: Secondary | ICD-10-CM | POA: Insufficient documentation

## 2023-08-02 DIAGNOSIS — O24414 Gestational diabetes mellitus in pregnancy, insulin controlled: Secondary | ICD-10-CM

## 2023-08-02 DIAGNOSIS — O24419 Gestational diabetes mellitus in pregnancy, unspecified control: Secondary | ICD-10-CM | POA: Diagnosis not present

## 2023-08-02 DIAGNOSIS — O09523 Supervision of elderly multigravida, third trimester: Secondary | ICD-10-CM

## 2023-08-06 ENCOUNTER — Encounter (HOSPITAL_COMMUNITY): Payer: BLUE CROSS/BLUE SHIELD

## 2023-08-08 ENCOUNTER — Other Ambulatory Visit: Payer: Self-pay | Admitting: Obstetrics and Gynecology

## 2023-08-09 ENCOUNTER — Ambulatory Visit: Payer: Medicaid Other

## 2023-08-09 ENCOUNTER — Ambulatory Visit: Payer: Medicaid Other | Attending: Obstetrics and Gynecology

## 2023-08-09 VITALS — BP 123/87 | HR 84

## 2023-08-09 DIAGNOSIS — O24419 Gestational diabetes mellitus in pregnancy, unspecified control: Secondary | ICD-10-CM | POA: Diagnosis present

## 2023-08-09 DIAGNOSIS — Z148 Genetic carrier of other disease: Secondary | ICD-10-CM | POA: Diagnosis present

## 2023-08-09 DIAGNOSIS — O24414 Gestational diabetes mellitus in pregnancy, insulin controlled: Secondary | ICD-10-CM | POA: Diagnosis not present

## 2023-08-09 DIAGNOSIS — Z3A33 33 weeks gestation of pregnancy: Secondary | ICD-10-CM

## 2023-08-09 DIAGNOSIS — O09523 Supervision of elderly multigravida, third trimester: Secondary | ICD-10-CM

## 2023-08-15 ENCOUNTER — Ambulatory Visit: Payer: Medicaid Other | Attending: Maternal & Fetal Medicine | Admitting: *Deleted

## 2023-08-15 ENCOUNTER — Ambulatory Visit: Payer: Medicaid Other

## 2023-08-15 VITALS — BP 132/84 | HR 100

## 2023-08-15 DIAGNOSIS — O24414 Gestational diabetes mellitus in pregnancy, insulin controlled: Secondary | ICD-10-CM

## 2023-08-15 DIAGNOSIS — O09523 Supervision of elderly multigravida, third trimester: Secondary | ICD-10-CM | POA: Insufficient documentation

## 2023-08-15 DIAGNOSIS — O24415 Gestational diabetes mellitus in pregnancy, controlled by oral hypoglycemic drugs: Secondary | ICD-10-CM | POA: Diagnosis not present

## 2023-08-15 DIAGNOSIS — Z3A34 34 weeks gestation of pregnancy: Secondary | ICD-10-CM | POA: Diagnosis not present

## 2023-08-15 NOTE — Procedures (Signed)
Katrina Blackwell 04-27-1986 [redacted]w[redacted]d  Fetus A Non-Stress Test Interpretation for 08/15/23  NST only  Indication: Gestational Diabetes medication controlled  Fetal Heart Rate A Mode: External Baseline Rate (A): 150 bpm Variability: Minimal, Moderate Accelerations: 15 x 15 Decelerations: None Multiple birth?: No  Uterine Activity Mode: Palpation, Toco Contraction Frequency (min): none Resting Tone Palpated: Relaxed  Interpretation (Fetal Testing) Nonstress Test Interpretation: Reactive Overall Impression: Reassuring for gestational age Comments: Dr. Darra Lis reviewed tracing

## 2023-08-22 ENCOUNTER — Ambulatory Visit: Payer: Medicaid Other | Admitting: *Deleted

## 2023-08-22 ENCOUNTER — Ambulatory Visit: Payer: Medicaid Other | Attending: Obstetrics and Gynecology

## 2023-08-22 VITALS — BP 118/76 | HR 90

## 2023-08-22 DIAGNOSIS — O09523 Supervision of elderly multigravida, third trimester: Secondary | ICD-10-CM | POA: Insufficient documentation

## 2023-08-22 DIAGNOSIS — O24414 Gestational diabetes mellitus in pregnancy, insulin controlled: Secondary | ICD-10-CM

## 2023-08-22 DIAGNOSIS — Z3A35 35 weeks gestation of pregnancy: Secondary | ICD-10-CM

## 2023-08-22 DIAGNOSIS — O24419 Gestational diabetes mellitus in pregnancy, unspecified control: Secondary | ICD-10-CM | POA: Insufficient documentation

## 2023-08-22 LAB — OB RESULTS CONSOLE GBS: GBS: NEGATIVE

## 2023-08-27 ENCOUNTER — Ambulatory Visit: Payer: Medicaid Other | Admitting: Cardiology

## 2023-08-29 ENCOUNTER — Telehealth (HOSPITAL_COMMUNITY): Payer: Self-pay | Admitting: *Deleted

## 2023-08-29 NOTE — Telephone Encounter (Signed)
Preadmission screen  

## 2023-08-30 ENCOUNTER — Ambulatory Visit: Payer: Medicaid Other | Attending: Obstetrics and Gynecology

## 2023-08-30 ENCOUNTER — Ambulatory Visit: Payer: Medicaid Other | Admitting: *Deleted

## 2023-08-30 VITALS — BP 124/87 | HR 89

## 2023-08-30 DIAGNOSIS — Z3A36 36 weeks gestation of pregnancy: Secondary | ICD-10-CM

## 2023-08-30 DIAGNOSIS — O24419 Gestational diabetes mellitus in pregnancy, unspecified control: Secondary | ICD-10-CM | POA: Insufficient documentation

## 2023-08-30 DIAGNOSIS — O09523 Supervision of elderly multigravida, third trimester: Secondary | ICD-10-CM | POA: Diagnosis not present

## 2023-08-30 DIAGNOSIS — O24414 Gestational diabetes mellitus in pregnancy, insulin controlled: Secondary | ICD-10-CM

## 2023-09-02 ENCOUNTER — Ambulatory Visit: Payer: Medicaid Other | Attending: Cardiology | Admitting: Cardiology

## 2023-09-02 ENCOUNTER — Encounter: Payer: Self-pay | Admitting: Cardiology

## 2023-09-02 ENCOUNTER — Encounter (HOSPITAL_COMMUNITY): Payer: Self-pay | Admitting: *Deleted

## 2023-09-02 VITALS — BP 138/94 | HR 93 | Ht 66.0 in | Wt 177.2 lb

## 2023-09-02 DIAGNOSIS — R55 Syncope and collapse: Secondary | ICD-10-CM

## 2023-09-02 DIAGNOSIS — Z3A36 36 weeks gestation of pregnancy: Secondary | ICD-10-CM

## 2023-09-02 NOTE — Progress Notes (Signed)
Cardio[facility]Obstetrics Clinic  New Evaluation  Date:  09/02/2023   ID:  Katrina Blackwell, DOB 04-11-1986, MRN [id number]  PCP:  Patient, No Pcp Per   [facility] Providers Cardiologist:  Thomasene Ripple, DO  Electrophysiologist:  None       Referring MD: No ref. provider found   Chief Complaint: " I am   History of Present Illness:    Katrina Blackwell is a 37 y.o. female [G3P2002] who is being seen today in follow up for syncope episode.  At her last visit she reported that she had been experiencing intermittent syncope episodes.  I placed a monitor on the patient to get an echocardiogram.  All of this testing were all normal.  She is  currently 36 weeks and 4 days into her pregnancy. The patient has a history of intermittent syncopal episodes during her pregnancy. However, since her last visit, she has not experienced any syncopal episodes. The patient is looking forward to her scheduled delivery this Thursday.   Prior CV Studies Reviewed: The following studies were reviewed today: Reviewed echo and zio monitor   Past Medical History:  Diagnosis Date   Abnormal glucose tolerance test 12/31/2017   Gestational diabetes    Headache(784.0)    MIGRAINES;OTC   Language barrier 05/12/2013   Speaks english, declines interpreter.    Migraines 05/11/2013    Past Surgical History:  Procedure Laterality Date   IR EMBO VENOUS NOT HEMORR HEMANG  INC GUIDE ROADMAPPING  02/07/2022   IR RADIOLOGIST EVAL & MGMT  01/01/2022   IR RADIOLOGIST EVAL & MGMT  03/09/2022   IR US GUIDE VASC ACCESS RIGHT  02/07/2022   IR VENOGRAM RENAL UNI LEFT  02/07/2022   TONSILLECTOMY  2009      OB History     Gravida  3   Para  2   Term  2   Preterm      AB      Living  2      SAB      IAB      Ectopic      Multiple  0   Live Births  2               Current Medications: Current Meds  Medication Sig   acetaminophen (TYLENOL) 500 MG tablet Take 500 mg by mouth every 6 (six) hours as  needed for moderate pain or headache.   acetaminophen-caffeine (EXCEDRIN TENSION HEADACHE) 500-65 MG TABS per tablet Take 1 tablet by mouth every 8 (eight) hours as needed.   cholecalciferol (VITAMIN D3) 25 MCG (1000 UNIT) tablet Take 1,000 Units by mouth daily.   famotidine (PEPCID) 20 MG tablet Take 1 tablet (20 mg total) by mouth daily.   insulin aspart (NOVOLOG) 100 UNIT/ML injection Inject 25 Units into the skin once. In AM   insulin NPH-regular Human (70-30) 100 UNIT/ML injection Inject 10 Units into the skin. After dinner   ondansetron (ZOFRAN ODT) 4 MG disintegrating tablet Take 1 tablet (4 mg total) by mouth every 8 (eight) hours as needed for nausea or vomiting.   pantoprazole (PROTONIX) 20 MG tablet Take 1 tablet (20 mg total) by mouth daily.   Prenatal Vit-Fe Fumarate-FA (PRENATAL MULTIVITAMIN) TABS tablet Take 1 tablet by mouth daily at 12 noon.   promethazine (PHENERGAN) 25 MG suppository Place 25 mg rectally every 6 (six) hours as needed for nausea or vomiting.   witch hazel-glycerin (TUCKS) pad Apply 1 Application topically as needed for itching.  Allergies:   Patient has no known allergies.   Social History   Socioeconomic History   Marital status: Married    Spouse name: [name]   Number of children: Not on file   Years of education: 16   Highest education level: Not on file  Occupational History   Occupation: HOMEMAKER-STUDENT  Tobacco Use   Smoking status: Former    Types: Cigarettes   Smokeless tobacco: Never  Vaping Use   Vaping status: Never Used  Substance and Sexual Activity   Alcohol use: No   Drug use: No   Sexual activity: Yes    Partners: Male  Other Topics Concern   Not on file  Social History Narrative   Not on file   Social Determinants of Health   Financial Resource Strain: Not on file  Food Insecurity: Not on file  Transportation Needs: Not on file  Physical Activity: Not on file  Stress: Not on file  Social Connections: Not on  file      Family History  Problem Relation Age of Onset   Stroke Mother    Asthma Mother    Hyperlipidemia Mother    Diabetes Father    Heart disease Father    Other Neg Hx       ROS:   Please see the history of present illness.     All other systems reviewed and are negative.   Labs/EKG Reviewed:    EKG:   EKG is was ordered today.  The ekg ordered today demonstrates Sinus tachycardia, HR 104 bpm  Recent Labs: No results found for requested labs within last 365 days.   Recent Lipid Panel No results found for: "CHOL", "TRIG", "HDL", "CHOLHDL", "LDLCALC", "LDLDIRECT"  Physical Exam:    VS:  BP (!) 138/94 (BP Location: Right Arm, Patient Position: Sitting, Cuff Size: Normal)   Pulse 93   Ht 5\' 6"  (1.676 m)   Wt 177 lb 3.2 oz (80.4 kg)   SpO2 96%   BMI 28.60 kg/m     Wt Readings from Last 3 Encounters:  09/02/23 177 lb 3.2 oz (80.4 kg)  07/05/23 168 lb 14.4 oz (76.6 kg)  05/17/23 160 lb 6.4 oz (72.8 kg)     GEN:  Well nourished, well developed in no acute distress HEENT: Normal NECK: No JVD; No carotid bruits LYMPHATICS: No lymphadenopathy CARDIAC: RRR, no murmurs, rubs, gallops RESPIRATORY:  Clear to auscultation without rales, wheezing or rhonchi  ABDOMEN: Soft, non-tender, non-distended MUSCULOSKELETAL:  No edema; No deformity  SKIN: Warm and dry NEUROLOGIC:  Alert and oriented x 3 PSYCHIATRIC:  Normal affect    Risk Assessment/Risk Calculators:                  ASSESSMENT & PLAN:    Syncope in Pregnancy No episodes since last visit. Patient is currently 36 weeks and 4 days pregnant. Schedule postpartum visit virtually at 6 weeks post-delivery.  General Health Maintenance Continue routine prenatal care. Plan for delivery on Thursday.  From a CV standpoint she is doing well.  Will monitor closely.  I will follow up with her postpartum.   Patient Instructions  Medication Instructions:  Your physician recommends that you continue on  your current medications as directed. Please refer to the Current Medication list given to you today.  *If you need a refill on your cardiac medications before your next appointment, please call your pharmacy*   Lab Work: None   Testing/Procedures: None   Follow-Up: At [facility], you and your health needs are our priority.  As part of our continuing mission to provide you with exceptional heart care, we have created designated Provider Care Teams.  These Care Teams include your primary Cardiologist (physician) and Advanced Practice Providers (APPs -  Physician Assistants and Nurse Practitioners) who all work together to provide you with the care you need, when you need it.  Your next appointment:   6 week(s) MyChart  Provider:   Thomasene Ripple, DO       Dispo:  No follow-ups on file.   Medication Adjustments/Labs and Tests Ordered: Current medicines are reviewed at length with the patient today.  Concerns regarding medicines are outlined above.  Tests Ordered: No orders of the defined types were placed in this encounter.  Medication Changes: No orders of the defined types were placed in this encounter.

## 2023-09-02 NOTE — Patient Instructions (Addendum)
Medication Instructions:  Your physician recommends that you continue on your current medications as directed. Please refer to the Current Medication list given to you today.  *If you need a refill on your cardiac medications before your next appointment, please call your pharmacy*   Lab Work: None   Testing/Procedures: None   Follow-Up: At [facility], you and your health needs are our priority.  As part of our continuing mission to provide you with exceptional heart care, we have created designated Provider Care Teams.  These Care Teams include your primary Cardiologist (physician) and Advanced Practice Providers (APPs -  Physician Assistants and Nurse Practitioners) who all work together to provide you with the care you need, when you need it.  Your next appointment:   6 week(s) MyChart  Provider:   Thomasene Ripple, DO

## 2023-09-03 ENCOUNTER — Encounter (HOSPITAL_COMMUNITY): Payer: Medicaid Other

## 2023-09-04 ENCOUNTER — Encounter (HOSPITAL_COMMUNITY): Payer: Medicaid Other

## 2023-09-04 ENCOUNTER — Telehealth (HOSPITAL_COMMUNITY): Payer: Self-pay | Admitting: *Deleted

## 2023-09-04 ENCOUNTER — Observation Stay (HOSPITAL_COMMUNITY): Payer: Medicaid Other

## 2023-09-04 NOTE — Telephone Encounter (Signed)
Message left on voicemail stating no food after MN.  Clear liquids until noon.  Arrive at 1:00PM for version.  Definition of clear liquids left on voicemail.

## 2023-09-05 ENCOUNTER — Other Ambulatory Visit: Payer: Self-pay | Admitting: Obstetrics and Gynecology

## 2023-09-05 ENCOUNTER — Inpatient Hospital Stay (HOSPITAL_COMMUNITY): Payer: Medicaid Other | Admitting: Anesthesiology

## 2023-09-05 ENCOUNTER — Telehealth (HOSPITAL_COMMUNITY): Payer: Medicaid Other

## 2023-09-05 ENCOUNTER — Encounter (HOSPITAL_COMMUNITY): Payer: Self-pay | Admitting: Obstetrics and Gynecology

## 2023-09-05 ENCOUNTER — Encounter (HOSPITAL_COMMUNITY)
Admission: AD | Disposition: A | Discharge status: Home / Self Care | Payer: Self-pay | Source: Home / Self Care | Attending: Obstetrics and Gynecology

## 2023-09-05 ENCOUNTER — Inpatient Hospital Stay (HOSPITAL_COMMUNITY): Payer: Medicaid Other

## 2023-09-05 ENCOUNTER — Inpatient Hospital Stay (HOSPITAL_COMMUNITY)
Admission: RE | Admit: 2023-09-05 | Payer: Medicaid Other | Source: Home / Self Care | Admitting: Obstetrics and Gynecology

## 2023-09-05 ENCOUNTER — Inpatient Hospital Stay (HOSPITAL_COMMUNITY)
Admission: RE | Admit: 2023-09-05 | Discharge: 2023-09-05 | Disposition: A | Discharge status: Home / Self Care | Payer: Medicaid Other | Source: Ambulatory Visit | Attending: Obstetrics and Gynecology | Admitting: Obstetrics and Gynecology

## 2023-09-05 ENCOUNTER — Inpatient Hospital Stay (HOSPITAL_COMMUNITY)
Admission: AD | Admit: 2023-09-05 | Discharge: 2023-09-08 | DRG: 806 | Disposition: A | Discharge status: Home / Self Care | Payer: Medicaid Other | Attending: Obstetrics and Gynecology | Admitting: Obstetrics and Gynecology

## 2023-09-05 ENCOUNTER — Encounter (HOSPITAL_COMMUNITY): Payer: BLUE CROSS/BLUE SHIELD

## 2023-09-05 ENCOUNTER — Encounter (HOSPITAL_COMMUNITY): Payer: Self-pay

## 2023-09-05 ENCOUNTER — Other Ambulatory Visit: Payer: Self-pay

## 2023-09-05 DIAGNOSIS — O1404 Mild to moderate pre-eclampsia, complicating childbirth: Secondary | ICD-10-CM | POA: Diagnosis present

## 2023-09-05 DIAGNOSIS — O24424 Gestational diabetes mellitus in childbirth, insulin controlled: Principal | ICD-10-CM | POA: Diagnosis present

## 2023-09-05 DIAGNOSIS — O9962 Diseases of the digestive system complicating childbirth: Secondary | ICD-10-CM | POA: Diagnosis present

## 2023-09-05 DIAGNOSIS — Z87891 Personal history of nicotine dependence: Secondary | ICD-10-CM | POA: Diagnosis not present

## 2023-09-05 DIAGNOSIS — Z3A37 37 weeks gestation of pregnancy: Secondary | ICD-10-CM | POA: Diagnosis not present

## 2023-09-05 DIAGNOSIS — K219 Gastro-esophageal reflux disease without esophagitis: Secondary | ICD-10-CM | POA: Diagnosis present

## 2023-09-05 DIAGNOSIS — N90810 Female genital mutilation status, unspecified: Secondary | ICD-10-CM | POA: Diagnosis present

## 2023-09-05 DIAGNOSIS — O3483 Maternal care for other abnormalities of pelvic organs, third trimester: Secondary | ICD-10-CM | POA: Diagnosis present

## 2023-09-05 DIAGNOSIS — Z823 Family history of stroke: Secondary | ICD-10-CM | POA: Diagnosis not present

## 2023-09-05 DIAGNOSIS — G43909 Migraine, unspecified, not intractable, without status migrainosus: Secondary | ICD-10-CM | POA: Diagnosis not present

## 2023-09-05 DIAGNOSIS — Z833 Family history of diabetes mellitus: Secondary | ICD-10-CM

## 2023-09-05 DIAGNOSIS — O403XX0 Polyhydramnios, third trimester, not applicable or unspecified: Secondary | ICD-10-CM | POA: Diagnosis present

## 2023-09-05 DIAGNOSIS — M069 Rheumatoid arthritis, unspecified: Secondary | ICD-10-CM | POA: Diagnosis present

## 2023-09-05 DIAGNOSIS — Z8249 Family history of ischemic heart disease and other diseases of the circulatory system: Secondary | ICD-10-CM

## 2023-09-05 DIAGNOSIS — Z148 Genetic carrier of other disease: Secondary | ICD-10-CM | POA: Diagnosis not present

## 2023-09-05 DIAGNOSIS — O99354 Diseases of the nervous system complicating childbirth: Secondary | ICD-10-CM | POA: Diagnosis present

## 2023-09-05 DIAGNOSIS — Z3490 Encounter for supervision of normal pregnancy, unspecified, unspecified trimester: Secondary | ICD-10-CM | POA: Diagnosis present

## 2023-09-05 DIAGNOSIS — O99824 Streptococcus B carrier state complicating childbirth: Secondary | ICD-10-CM | POA: Diagnosis present

## 2023-09-05 DIAGNOSIS — O326XX0 Maternal care for compound presentation, not applicable or unspecified: Secondary | ICD-10-CM | POA: Diagnosis present

## 2023-09-05 DIAGNOSIS — O329XX0 Maternal care for malpresentation of fetus, unspecified, not applicable or unspecified: Principal | ICD-10-CM | POA: Diagnosis present

## 2023-09-05 DIAGNOSIS — Z825 Family history of asthma and other chronic lower respiratory diseases: Secondary | ICD-10-CM

## 2023-09-05 DIAGNOSIS — O24414 Gestational diabetes mellitus in pregnancy, insulin controlled: Secondary | ICD-10-CM | POA: Diagnosis present

## 2023-09-05 DIAGNOSIS — N3941 Urge incontinence: Secondary | ICD-10-CM | POA: Diagnosis present

## 2023-09-05 DIAGNOSIS — O09529 Supervision of elderly multigravida, unspecified trimester: Secondary | ICD-10-CM

## 2023-09-05 DIAGNOSIS — O321XX0 Maternal care for breech presentation, not applicable or unspecified: Secondary | ICD-10-CM | POA: Diagnosis present

## 2023-09-05 LAB — GLUCOSE, CAPILLARY
Glucose-Capillary: 64 mg/dL — ABNORMAL LOW (ref 70–99)
Glucose-Capillary: 142 mg/dL — ABNORMAL HIGH (ref 70–99)
Glucose-Capillary: 74 mg/dL (ref 70–99)

## 2023-09-05 LAB — CBC
HCT: 37.3 % (ref 36.0–46.0)
Hemoglobin: 12.4 g/dL (ref 12.0–15.0)
MCH: 27.9 pg (ref 26.0–34.0)
MCHC: 33.2 g/dL (ref 30.0–36.0)
MCV: 83.8 fL (ref 80.0–100.0)
Platelets: 192 10*3/uL (ref 150–400)
RBC: 4.45 MIL/uL (ref 3.87–5.11)
RDW: 13.8 % (ref 11.5–15.5)
WBC: 5.2 10*3/uL (ref 4.0–10.5)
nRBC: 0 % (ref 0.0–0.2)

## 2023-09-05 LAB — TYPE AND SCREEN
ABO/RH(D): O POS
Antibody Screen: NEGATIVE

## 2023-09-05 SURGERY — Surgical Case
Anesthesia: Epidural

## 2023-09-05 MED ORDER — ONDANSETRON HCL 4 MG/2ML IJ SOLN
4.0000 mg | Freq: Four times a day (QID) | INTRAMUSCULAR | Status: DC | PRN
  Administered 2023-09-06: 4 mg via INTRAVENOUS
  Filled 2023-09-05: qty 2

## 2023-09-05 MED ORDER — PHENYLEPHRINE 80 MCG/ML (10ML) SYRINGE FOR IV PUSH (FOR BLOOD PRESSURE SUPPORT): 80.0000 ug | PREFILLED_SYRINGE | INTRAVENOUS | Status: DC | PRN

## 2023-09-05 MED ORDER — SOD CITRATE-CITRIC ACID 500-334 MG/5ML PO SOLN: 30.0000 mL | ORAL | Status: DC

## 2023-09-05 MED ORDER — TERBUTALINE SULFATE 1 MG/ML IJ SOLN: 0.2500 mg | Freq: Once | INTRAMUSCULAR | Status: DC | PRN

## 2023-09-05 MED ORDER — LACTATED RINGERS IV SOLN: 500.0000 mL | INTRAVENOUS | Status: DC | PRN

## 2023-09-05 MED ORDER — OXYTOCIN-SODIUM CHLORIDE 30-0.9 UT/500ML-% IV SOLN
2.5000 [IU]/h | INTRAVENOUS | Status: DC
  Administered 2023-09-06: 2.5 [IU]/h via INTRAVENOUS
  Filled 2023-09-05: qty 500

## 2023-09-05 MED ORDER — FENTANYL CITRATE (PF) 100 MCG/2ML IJ SOLN
50.0000 ug | INTRAMUSCULAR | Status: DC | PRN
  Administered 2023-09-05 (×2): 100 ug via INTRAVENOUS
  Filled 2023-09-05 (×2): qty 2

## 2023-09-05 MED ORDER — DIPHENHYDRAMINE HCL 50 MG/ML IJ SOLN
12.5000 mg | INTRAMUSCULAR | Status: DC | PRN
  Administered 2023-09-06 (×2): 12.5 mg via INTRAVENOUS
  Filled 2023-09-05 (×2): qty 1

## 2023-09-05 MED ORDER — EPHEDRINE 5 MG/ML INJ: 10.0000 mg | INTRAVENOUS | Status: DC | PRN

## 2023-09-05 MED ORDER — LACTATED RINGERS IV BOLUS
1000.0000 mL | Freq: Once | INTRAVENOUS | Status: AC
  Administered 2023-09-05: 1000 mL via INTRAVENOUS

## 2023-09-05 MED ORDER — OXYTOCIN BOLUS FROM INFUSION
333.0000 mL | Freq: Once | INTRAVENOUS | Status: AC
  Administered 2023-09-06: 333 mL via INTRAVENOUS

## 2023-09-05 MED ORDER — SODIUM CHLORIDE 0.9% FLUSH: 10.0000 mL | Freq: Two times a day (BID) | INTRAVENOUS | Status: DC

## 2023-09-05 MED ORDER — FENTANYL-BUPIVACAINE-NACL 0.5-0.125-0.9 MG/250ML-% EP SOLN
12.0000 mL/h | EPIDURAL | Status: DC | PRN
  Administered 2023-09-06: 12 mL/h via EPIDURAL
  Filled 2023-09-05: qty 250

## 2023-09-05 MED ORDER — SOD CITRATE-CITRIC ACID 500-334 MG/5ML PO SOLN
30.0000 mL | ORAL | Status: DC | PRN
  Administered 2023-09-05: 30 mL via ORAL
  Filled 2023-09-05: qty 30

## 2023-09-05 MED ORDER — MISOPROSTOL 25 MCG QUARTER TABLET
25.0000 ug | ORAL_TABLET | ORAL | Status: DC | PRN
  Administered 2023-09-05 (×2): 25 ug via VAGINAL
  Filled 2023-09-05 (×2): qty 1

## 2023-09-05 MED ORDER — LACTATED RINGERS IV SOLN: INTRAVENOUS | Status: DC

## 2023-09-05 MED ORDER — OXYCODONE-ACETAMINOPHEN 5-325 MG PO TABS: 2.0000 | ORAL_TABLET | ORAL | Status: DC | PRN

## 2023-09-05 MED ORDER — MISOPROSTOL 25 MCG QUARTER TABLET: 25.0000 ug | ORAL_TABLET | ORAL | Status: DC | PRN

## 2023-09-05 MED ORDER — PHENYLEPHRINE 80 MCG/ML (10ML) SYRINGE FOR IV PUSH (FOR BLOOD PRESSURE SUPPORT)
80.0000 ug | PREFILLED_SYRINGE | INTRAVENOUS | Status: DC | PRN
  Filled 2023-09-05: qty 10

## 2023-09-05 MED ORDER — ACETAMINOPHEN 325 MG PO TABS
650.0000 mg | ORAL_TABLET | ORAL | Status: DC | PRN
  Administered 2023-09-06: 650 mg via ORAL
  Filled 2023-09-05: qty 2

## 2023-09-05 MED ORDER — LIDOCAINE HCL (PF) 1 % IJ SOLN: 30.0000 mL | INTRAMUSCULAR | Status: DC | PRN

## 2023-09-05 MED ORDER — OXYCODONE-ACETAMINOPHEN 5-325 MG PO TABS: 1.0000 | ORAL_TABLET | ORAL | Status: DC | PRN

## 2023-09-05 MED ORDER — CEFAZOLIN SODIUM-DEXTROSE 2-4 GM/100ML-% IV SOLN: 2.0000 g | INTRAVENOUS | Status: DC

## 2023-09-05 NOTE — H&P (Addendum)
Katrina Blackwell is a 37 y.o. female, G3P2002, IUP at 37 weeks, presenting for Version for Breech presentation at 37 weeks, plans for if version is unsuccessful will proceed with PCS, if successful will proceed with IOL.  Pt found to be vertex. And will proceed with IOL for GDM2. Pt endorse + Fm. Denies vaginal leakage. Denies vaginal bleeding. Denies feeling cxt's.   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  Started insulin at 30 weeks,  Currently 15 units NPH in morning, 10 units sliding scale) past pregnancy history of gestational diabetes mellitus (Last pregnancy, on glyburide) rheumatoid arthritis (Seen by primary, no meds.  ? positive ANA, ROI for records.  Patient will send records after visit in late June to rheumatology.) Tachycardia (Noted at 17 weeks, referred to Dr. Servando Salina, seen 6/28, EKG sinus tach, echo and ZIO monitor ordered.  Neg w/u.) vitamin D deficiency (Noted at NOB, recommended Rx.)  Patient Active Problem List   Diagnosis Date Noted   Malpresentation before onset of labor 09/05/2023   Carrier of spinal muscular atrophy 07/19/2023   AMA (advanced maternal age) multigravida 35+ 07/19/2023   Syncope and collapse 07/07/2023   Insulin controlled gestational diabetes mellitus (GDM) in third trimester 07/07/2023   Traumatic injury during pregnancy in second trimester 06/27/2023     Active Ambulatory Problems    Diagnosis Date Noted   Traumatic  injury during pregnancy in second trimester 06/27/2023   Syncope and collapse 07/07/2023   Insulin controlled gestational diabetes mellitus (GDM) in third trimester 07/07/2023   Carrier of spinal muscular atrophy 07/19/2023   AMA (advanced maternal age) multigravida 35+ 07/19/2023   Resolved Ambulatory Problems    Diagnosis Date Noted   UTI in pregnancy 01/07/2013   GBS (group B Streptococcus carrier), +RV culture, currently pregnant 05/11/2013   Generalized anxiety disorder 05/11/2013   Migraines 05/11/2013   False labor 05/11/2013   Active labor 05/12/2013   Language barrier 05/12/2013   Vaginal delivery 05/12/2013   Laceration of periurethral tissue with delivery 05/12/2013   Supervision of high risk pregnancy, antepartum 03/08/2017   GDM (gestational diabetes mellitus) 07/18/2017   Impaired glucose tolerance test 08/12/2017   SVD (spontaneous vaginal delivery) 09/09/2017   Abnormal glucose tolerance test 12/31/2017   Past Medical History:  Diagnosis Date   Gestational diabetes    Headache(784.0)       Medications Prior to Admission  Medication Sig Dispense Refill Last Dose   acetaminophen (TYLENOL) 500 MG tablet Take 1,000 mg by mouth every 6 (six) hours as needed for moderate pain (pain score 4-6) or headache.      calcium carbonate (TUMS - DOSED IN MG ELEMENTAL CALCIUM) 500 MG chewable tablet Chew 2 tablets by mouth daily as needed for indigestion or heartburn.      Cholecalciferol (VITAMIN D) 50 MCG (2000 UT) tablet Take 2,000 Units by mouth daily.      Ferrous Sulfate (IRON PO) Take 1 tablet by mouth daily.      insulin aspart (NOVOLOG) 100 UNIT/ML injection Inject 3-10 Units into the skin See admin instructions.  Inject 10 units once daily, may inject a second dose of 3-5 units during the day as needed for high blood sugar      insulin NPH Human (NOVOLIN N) 100 UNIT/ML injection Inject 15 Units into the skin daily.      ondansetron (ZOFRAN-ODT) 8 MG disintegrating tablet  Take 8 mg by mouth every 8 (eight) hours as needed for nausea or vomiting.      pantoprazole (PROTONIX) 20 MG tablet Take 1 tablet (20 mg total) by mouth daily. (Patient taking differently: Take 20 mg by mouth daily as needed for heartburn.) 30 tablet 1    Prenatal Vit-Fe Fumarate-FA (PRENATAL MULTIVITAMIN) TABS tablet Take 1 tablet by mouth daily.      promethazine (PHENERGAN) 12.5 MG tablet Take 12.5 mg by mouth every 6 (six) hours as needed for nausea or vomiting.      senna-docusate (SENOKOT-S) 8.6-50 MG tablet Take 1 tablet by mouth daily.      SUMAtriptan (IMITREX) 50 MG tablet Take 50 mg by mouth every 2 (two) hours as needed for migraine. May repeat in 2 hours if headache persists or recurs.      witch hazel-glycerin (TUCKS) pad Apply 1 Application topically as needed for itching. 40 each 12     Past Medical History:  Diagnosis Date   Abnormal glucose tolerance test 12/31/2017   Gestational diabetes    Headache(784.0)    MIGRAINES;OTC   Language barrier 05/12/2013   Speaks english, declines interpreter.    Migraines 05/11/2013     No current facility-administered medications on file prior to encounter.   Current Outpatient Medications on File Prior to Encounter  Medication Sig Dispense Refill   acetaminophen (TYLENOL) 500 MG tablet Take 1,000 mg by mouth every 6 (six) hours as needed for moderate pain (pain score 4-6) or headache.     calcium carbonate (TUMS - DOSED IN MG ELEMENTAL CALCIUM) 500 MG chewable tablet Chew 2 tablets by mouth daily as needed for indigestion or heartburn.     Cholecalciferol (VITAMIN D) 50 MCG (2000 UT) tablet Take 2,000 Units by mouth daily.     Ferrous Sulfate (IRON PO) Take 1 tablet by mouth daily.     insulin aspart (NOVOLOG) 100 UNIT/ML injection Inject 3-10 Units into the skin See admin instructions. Inject 10 units once daily, may inject a second dose of 3-5 units during the day as needed for high blood sugar     insulin NPH Human (NOVOLIN N) 100  UNIT/ML injection Inject 15 Units into the skin daily.     ondansetron (ZOFRAN-ODT) 8 MG disintegrating tablet Take 8 mg by mouth every 8 (eight) hours as needed for nausea or vomiting.     pantoprazole (PROTONIX) 20 MG tablet Take 1 tablet (20 mg total) by mouth daily. (Patient taking differently: Take 20 mg by mouth daily as needed for heartburn.) 30 tablet 1   Prenatal Vit-Fe Fumarate-FA (PRENATAL MULTIVITAMIN) TABS tablet Take 1 tablet by mouth daily.     promethazine (PHENERGAN) 12.5 MG tablet Take 12.5 mg by mouth every 6 (six) hours as needed for nausea or vomiting.     senna-docusate (SENOKOT-S) 8.6-50 MG tablet Take 1 tablet by mouth daily.     SUMAtriptan (IMITREX) 50 MG tablet Take 50 mg by mouth every 2 (two) hours as needed for migraine. May repeat in 2 hours if headache persists or recurs.     witch hazel-glycerin (TUCKS) pad Apply 1 Application topically as needed for itching. 40 each 12  No Known Allergies  History of present pregnancy: Pt Info/Preference:  Screening/Consents:  Blackwell:   EDD: Estimated Date of Delivery: 09/26/23  Establised: No LMP recorded. Patient is pregnant.  Anatomy Scan: Date: 6/14 Placenta Location: anterior and complete Genetic Screen: Panoroma:LR female AFP:  First Tri: Quad:  Office: [facility]            Md: DR Su Hilt First PNV: 12.1 weeks Blood Type  O+  Language: english Last PNV: 36.5 weeks Rhogam  N/A  Flu Vaccine:  declined   Antibody Negative (04/18 0000)  TDaP vaccine declined   GTT: Early: 5.5 Third Trimester: 234 failed  Feeding Plan: Br/Bt BTL: no Rubella: Immune (04/18 0000)  Contraception: IUD VBAC: no RPR: Nonreactive (04/18 0000)   Circumcision: N/A   HBsAg: Negative (04/18 0000)  Pediatrician:  ???   HIV: Non-reactive (04/18 0000)   Prenatal Classes: no Additional [place]: 10/14 breech, anterior, AFI 22.38. BPP 8/8, EFW 86% GBS: Negative/-- (10/03 0000)(For PCN allergy, check sensitivities)       Chlamydia: neg    MFM Referral/Consult:   GC: neg  Support Person: parnter   PAP: ???  Pain Management: epidural Neonatologist Referral:  Hgb Electrophoresis:  AA  Birth Plan: IOL VS PCS   Hgb NOB: 11.5    28W: 10.9   OB History     Gravida  3   Para  2   Term  2   Preterm      AB      Living  2      SAB      IAB      Ectopic      Multiple  0   Live Births  2          Past Medical History:  Diagnosis Date   Abnormal glucose tolerance test 12/31/2017   Gestational diabetes    Headache(784.0)    MIGRAINES;OTC   Language barrier 05/12/2013   Speaks english, declines interpreter.    Migraines 05/11/2013   Past Surgical History:  Procedure Laterality Date   IR EMBO VENOUS NOT HEMORR HEMANG  INC GUIDE ROADMAPPING  02/07/2022   IR RADIOLOGIST EVAL & MGMT  01/01/2022   IR RADIOLOGIST EVAL & MGMT  03/09/2022   IR US GUIDE VASC ACCESS RIGHT  02/07/2022   IR VENOGRAM RENAL UNI LEFT  02/07/2022   TONSILLECTOMY  2009   Family History: family history includes Asthma in her mother; Diabetes in her father; Heart disease in her father; Hyperlipidemia in her mother; Stroke in her mother. Social History:  reports that she has quit smoking. Her smoking use included cigarettes. She has never used smokeless tobacco. She reports that she does not drink alcohol and does not use drugs.   Prenatal Transfer Tool  Maternal Diabetes: Yes:  Diabetes Type:  Insulin/Medication controlled Genetic Screening: Abnormal:  Results: Other:SMA carrior FOB declined testing Maternal Ultrasounds/Referrals: Normal Fetal Ultrasounds or other Referrals:  None Maternal Substance Abuse:  No Significant Maternal Medications:  Meds include: Other:  Currently 15 units NPH in morning, 10 units sliding scale Significant Maternal Lab Results: Group B Strep negative  ROS:  Review of Systems  Constitutional: Negative.   HENT: Negative.    Eyes: Negative.   Respiratory: Negative.    Cardiovascular: Negative.   Gastrointestinal: Negative.    Genitourinary: Negative.   Musculoskeletal: Negative.   Skin: Negative.   Neurological: Negative.   Endo/Heme/Allergies: Negative.   Psychiatric/Behavioral: Negative.       Physical Exam: There  were no vitals taken for this visit.  Physical Exam Vitals and nursing note reviewed.  Constitutional:      Appearance: Normal appearance.  HENT:     Head: Normocephalic.     Nose: Nose normal.     Mouth/Throat:     Pharynx: Oropharynx is clear.  Eyes:     Conjunctiva/sclera: Conjunctivae normal.  Cardiovascular:     Rate and Rhythm: Normal rate and regular rhythm.     Pulses: Normal pulses.     Heart sounds: Normal heart sounds.  Pulmonary:     Effort: Pulmonary effort is normal.  Abdominal:     General: Bowel sounds are normal.  Musculoskeletal:        General: Normal range of motion.     Cervical back: Normal range of motion.  Skin:    General: Skin is warm.     Capillary Refill: Capillary refill takes less than 2 seconds.  Neurological:     General: No focal deficit present.     Mental Status: She is alert.  Psychiatric:        Mood and Affect: Mood normal.      NST: FHR baseline 150 bpm, Variability: moderate, Accelerations:present, Decelerations:  Absent= Cat 1/Reactive UC:   none SVE: Deferred for now Leopold's: Position Breech, EFW 7.5lbs via leopold's.   Blackwell: No results found for this or any previous visit (from the past 24 hour(s)).  Imaging:  [place] MFM FETAL BPP WO NON STRESS  Result Date: 08/30/2023 ----------------------------------------------------------------------  OBSTETRICS REPORT                       (Signed Final 08/30/2023 03:07 pm) ---------------------------------------------------------------------- Patient Info  ID #:       [id number]                          D.O.B.:  1986-07-19 (37 yrs)  Name:       Katrina Blackwell                    Visit Date: 08/30/2023 02:17 pm ---------------------------------------------------------------------- Performed By   Attending:        Noralee Space MD        Ref. Address:     [facility] &                                                             Gynecology                                                             [address]                                                             [address]                                                             [place], [place]                                                             [address]  Performed By:     Marcellina Millin       [facility]                                     [facility]  Referred By:      Nigel Bridgeman                    CNM ---------------------------------------------------------------------- Orders  #  Description                           Code        Ordered By  1  [place] MFM FETAL BPP WO NON               76819.01    RAVI [facility]     STRESS ----------------------------------------------------------------------  #  Order #                     Accession #                Episode #  1  [id number]                   [id number]                 [id number] ---------------------------------------------------------------------- Indications  Gestational diabetes in pregnancy, insulin     O24.414  controlled  Advanced maternal age multigravida 79+,        O55.523  third trimester ( 37 years )  [redacted] weeks gestation of pregnancy                Z3A.36 ---------------------------------------------------------------------- Fetal Evaluation  Num Of Fetuses:         1  Fetal Heart Rate(bpm):  142  Cardiac Activity:       Observed  Presentation:           Breech  Placenta:               Anterior  P. Cord Insertion:      Previously seen  Amniotic Fluid  AFI  FV:      Subjectively upper-normal  AFI Sum(cm)     %Tile       Largest Pocket(cm)  22.38           86          8.49  RUQ(cm)       RLQ(cm)       LUQ(cm)        LLQ(cm)  8.49          4.89          4.53           4.47 ---------------------------------------------------------------------- Biophysical Evaluation  Amniotic F.V:   Pocket => 2 cm             F. Tone:        Observed  F. Movement:    Observed                   Score:          8/8  F. Breathing:   Observed ---------------------------------------------------------------------- OB History  Blood Type:   O+  Maternal Racial/Ethnic Group:   Black (non-Hispanic)  Gravidity:    3         Term:   2        Prem:   0        SAB:   0  TOP:          0       Ectopic:  0        Living: 2 ---------------------------------------------------------------------- Gestational Age  Best:          36w 5d     Det. By:  U/S  (07/26/23)          EDD:   09/22/23 ---------------------------------------------------------------------- Impression  GDM on insulin.  Fetal growth is appropriate for gestational age .Amniotic fluid  is normal and good fetal activity is seen .Antenatal testing is  reassuring. BPP 8/8. BREECH presentation.  I briefly discussed external cephalic version (if no  spontaneous version occurs) before induction of labor. ----------------------------------------------------------------------                  Noralee Space, MD Electronically Signed Final Report   08/30/2023 03:07 pm ----------------------------------------------------------------------   [place] MFM FETAL BPP WO NON STRESS  Result Date: 08/22/2023 ----------------------------------------------------------------------  OBSTETRICS REPORT                       (Signed Final 08/22/2023 02:24 pm) ---------------------------------------------------------------------- Patient Info  ID #:       [id number]                          D.O.B.:  1986-10-12 (37 yrs)  Name:  Katrina Blackwell                    Visit Date:  08/22/2023 01:39 pm ---------------------------------------------------------------------- Performed By  Attending:        Braxton Feathers DO       Ref. Address:     [facility] &                                                             Gynecology                                                             [address]                                                             [address]                                                             [place], [place]                                                             [address]  Performed By:     Marcellina Millin       Location[facility]         [facility]                    RD[facility]                                     [facility]  Referred By:      Nigel Bridgeman                    CNM ---------------------------------------------------------------------- Orders  #  Description                           Code        Ordered By  1  [place] MFM FETAL BPP WO NON               76819.01    RAVI SHANKAR     STRESS  2  [place] MFM OB FOLLOW UP                   76816.01    RAVI [facility] ----------------------------------------------------------------------  #  Order #                     Accession #                Episode #  1  [id number]                   [id number]                 [id number]  2  [id number]                   [id number]                 [id number] ---------------------------------------------------------------------- Indications  Gestational diabetes in pregnancy, insulin     O24.414  controlled  Advanced maternal age multigravida 8+,        O5.523  third trimester ( 37 years )  Encounter for other antenatal screening        Z36.2  follow-up  [redacted] weeks gestation of pregnancy                Z3A.35  ---------------------------------------------------------------------- Vital Signs  BP:          118/76 ---------------------------------------------------------------------- Fetal Evaluation  Num Of Fetuses:         1  Fetal Heart Rate(bpm):  157  Cardiac Activity:       Observed  Presentation:           Breech  Placenta:               Anterior  P. Cord Insertion:      Previously seen  Amniotic Fluid  AFI FV:      Subjectively upper-normal  AFI Sum(cm)     %Tile       Largest Pocket(cm)  22.8            86          9.67  RUQ(cm)       RLQ(cm)       LUQ(cm)        LLQ(cm)  9.67          6.61          2.22           4.3 ----------------------------------------------------------------------  Biophysical Evaluation  Amniotic F.V:   Pocket => 2 cm             F. Tone:        Observed  F. Movement:    Observed                   Score:          8/8  F. Breathing:   Observed ---------------------------------------------------------------------- Biometry  BPD:      88.9  mm     G. Age:  36w 0d         67  %    CI:        77.19   %    70 - 86                                                          FL/HC:      20.8   %    20.1 - 22.1  HC:      320.4  mm     G. Age:  36w 1d         31  %    HC/AC:      0.99        0.93 - 1.11  AC:      322.3  mm     G. Age:  36w 1d         75  %    FL/BPD:     74.9   %    71 - 87  FL:       66.6  mm     G. Age:  34w 2d         15  %    FL/AC:      20.7   %    20 - 24  Est. FW:    2731  gm           6 lb     51  % ---------------------------------------------------------------------- OB History  Blood Type:   O+  Maternal Racial/Ethnic Group:   Black (non-Hispanic)  Gravidity:    3         Term:   2        Prem:   0        SAB:   0  TOP:          0       Ectopic:  0        Living: 2 ---------------------------------------------------------------------- Gestational Age  U/S Today:     35w 5d                                        EDD:   09/21/23  Best:          35w 4d     Det. By:  U/S   (07/26/23)          EDD:   09/22/23 ---------------------------------------------------------------------- Anatomy  Cranium:               Appears normal         Aortic Arch:            Previously seen  Cavum:                 Previously seen        Ductal Arch:            Previously seen  Ventricles:            Previously seen        Diaphragm:              Appears normal  Choroid Plexus:        Previously seen        Stomach:                Appears normal, left                                                                        sided  Cerebellum:            Previously seen        Abdomen:                Appears normal  Posterior Fossa:       Previously seen        Abdominal Wall:         Previously seen  Nuchal Fold:           Previously seen        Cord Vessels:           Previously seen  Face:                  Orbits and profile     Kidneys:                Appear normal                         previously seen  Lips:                  Previously seen        Bladder:                Appears normal  Thoracic:              Previously seen        Spine:                  Previously seen  Heart:                 Previously seen        Upper Extremities:      Previously seen  RVOT:                  Previously seen        Lower Extremities:      Previously seen  LVOT:                  Previously seen  Other:  Female gender previously seen. Nasal bone, Lenses, Hands,          feet/heels, VC, 3VV and 3VTV previously visualized. ---------------------------------------------------------------------- Comments  The patient is here for a follow-up BPP and growth ultrasound  at 35w 4d. EDD: 09/22/2023 dated by U/S  (  07/26/23). She  has no concerns today.  Sonographic findings  Single intrauterine pregnancy.  Fetal cardiac activity: Observed.  Presentation: Breech.  Interval fetal anatomy appears normal.  Fetal biometry shows the estimated fetal weight at the 51  percentile.  Amniotic fluid volume: Subjectively upper-normal. AFI:  22.8  cm.  MVP: 9.67 cm.  Placenta: Anterior.  BPP: 8/8.  Recommendations  1. Weekly antenatal testing until delivery. Currently breech,  will look again nest week.  2. Delivery scheduled for 37 weeks ----------------------------------------------------------------------                  Braxton Feathers, DO Electronically Signed Final Report   08/22/2023 02:24 pm ----------------------------------------------------------------------   [place] MFM OB FOLLOW UP  Result Date: 08/22/2023 ----------------------------------------------------------------------  OBSTETRICS REPORT                       (Signed Final 08/22/2023 02:24 pm) ---------------------------------------------------------------------- Patient Info  ID #:       [id number]                          D.O.B.:  [birth year] (37 yrs)  Name:       Katrina Blackwell                    Visit Date: 08/22/2023 01:39 pm ---------------------------------------------------------------------- Performed By  Attending:        Braxton Feathers DO       Ref. Address:     [facility] &                                                             Gynecology                                                             [address]                                                             [address]  [place], [place]                                                             [address]  Performed By:     Marcellina Millin       Location[facility]         [facility]                                     [facility]                                                             [facility]  Referred By:      Nigel Bridgeman                    CNM ---------------------------------------------------------------------- Orders   #  Description                           Code        Ordered By  1  [place] MFM FETAL BPP WO NON               76819.01    RAVI SHANKAR     STRESS  2  [place] MFM OB FOLLOW UP                   76816.01    RAVI [facility] ----------------------------------------------------------------------  #  Order #                     Accession #                Episode #  1  [id number]                   [id number]                 [id number]  2  [id number]                   [id number]                 [id number] ---------------------------------------------------------------------- Indications  Gestational diabetes in pregnancy, insulin     O24.414  controlled  Advanced maternal age multigravida 5+,        O59.523  third trimester ( 37 years )  Encounter for other antenatal screening        Z36.2  follow-up  [redacted] weeks gestation of pregnancy  Z3A.35 ---------------------------------------------------------------------- Vital Signs  BP:          118/76 ---------------------------------------------------------------------- Fetal Evaluation  Num Of Fetuses:         1  Fetal Heart Rate(bpm):  157  Cardiac Activity:       Observed  Presentation:           Breech  Placenta:               Anterior  P. Cord Insertion:      Previously seen  Amniotic Fluid  AFI FV:      Subjectively upper-normal  AFI Sum(cm)     %Tile       Largest Pocket(cm)  22.8            86          9.67  RUQ(cm)       RLQ(cm)       LUQ(cm)        LLQ(cm)  9.67          6.61          2.22           4.3 ---------------------------------------------------------------------- Biophysical Evaluation  Amniotic F.V:   Pocket => 2 cm             F. Tone:        Observed  F. Movement:    Observed                   Score:          8/8  F. Breathing:   Observed ---------------------------------------------------------------------- Biometry  BPD:      88.9  mm     G. Age:  36w 0d         67  %    CI:        77.19   %    70 - 86                                                           FL/HC:      20.8   %    20.1 - 22.1  HC:      320.4  mm     G. Age:  36w 1d         31  %    HC/AC:      0.99        0.93 - 1.11  AC:      322.3  mm     G. Age:  36w 1d         75  %    FL/BPD:     74.9   %    71 - 87  FL:       66.6  mm     G. Age:  34w 2d         15  %    FL/AC:      20.7   %    20 - 24  Est. FW:    2731  gm           6 lb     51  % ---------------------------------------------------------------------- OB History  Blood Type:   O+  Maternal Racial/Ethnic Group:   Black (non-Hispanic)  Gravidity:  3         Term:   2        Prem:   0        SAB:   0  TOP:          0       Ectopic:  0        Living: 2 ---------------------------------------------------------------------- Gestational Age  U/S Today:     35w 5d                                        EDD:   09/21/23  Best:          35w 4d     Det. By:  U/S  (07/26/23)          EDD:   09/22/23 ---------------------------------------------------------------------- Anatomy  Cranium:               Appears normal         Aortic Arch:            Previously seen  Cavum:                 Previously seen        Ductal Arch:            Previously seen  Ventricles:            Previously seen        Diaphragm:              Appears normal  Choroid Plexus:        Previously seen        Stomach:                Appears normal, left                                                                        sided  Cerebellum:            Previously seen        Abdomen:                Appears normal  Posterior Fossa:       Previously seen        Abdominal Wall:         Previously seen  Nuchal Fold:           Previously seen        Cord Vessels:           Previously seen  Face:                  Orbits and profile     Kidneys:                Appear normal                         previously seen  Lips:                  Previously seen        Bladder:  Appears normal  Thoracic:              Previously seen        Spine:                  Previously seen  Heart:                  Previously seen        Upper Extremities:      Previously seen  RVOT:                  Previously seen        Lower Extremities:      Previously seen  LVOT:                  Previously seen  Other:  Female gender previously seen. Nasal bone, Lenses, Hands,          feet/heels, VC, 3VV and 3VTV previously visualized. ---------------------------------------------------------------------- Comments  The patient is here for a follow-up BPP and growth ultrasound  at 35w 4d. EDD: 09/22/2023 dated by U/S  (07/26/23). She  has no concerns today.  Sonographic findings  Single intrauterine pregnancy.  Fetal cardiac activity: Observed.  Presentation: Breech.  Interval fetal anatomy appears normal.  Fetal biometry shows the estimated fetal weight at the 51  percentile.  Amniotic fluid volume: Subjectively upper-normal. AFI: 22.8  cm.  MVP: 9.67 cm.  Placenta: Anterior.  BPP: 8/8.  Recommendations  1. Weekly antenatal testing until delivery. Currently breech,  will look again nest week.  2. Delivery scheduled for 37 weeks ----------------------------------------------------------------------                  Braxton Feathers, DO Electronically Signed Final Report   08/22/2023 02:24 pm ----------------------------------------------------------------------   [place] MFM FETAL BPP WO NON STRESS  Result Date: 08/09/2023 ----------------------------------------------------------------------  OBSTETRICS REPORT                       (Signed Final 08/09/2023 11:19 am) ---------------------------------------------------------------------- Patient Info  ID #:       [id number]                          D.O.B.:  [birth year] (37 yrs)  Name:       Katrina Blackwell                    Visit Date: 08/09/2023 09:35 am ---------------------------------------------------------------------- Performed By  Attending:        Lin Landsman      Secondary Phy.:   [facility] MAU/Triage                    MD  Performed By:     Alain Marion     [facility]  Referred By:      Wenda Low CHUBB ---------------------------------------------------------------------- Orders  #  Description                           Code        Ordered By  1  [place] MFM FETAL BPP WO NON               [address].60    RAVI [facility]     STRESS ----------------------------------------------------------------------  #  Order #                     Accession #                Episode #  1  [id number]                   [id number]                 [id number] ---------------------------------------------------------------------- Indications  [redacted] weeks gestation of pregnancy                Z3A.33  Gestational diabetes in pregnancy, insulin     O24.414  controlled  Advanced maternal age multigravida 26+,        O93.523  third trimester ( 37 years )  Traumatic injury during pregnancy              O9A.219 T14.90  [redacted] weeks gestation of pregnancy                Z3A.32 ---------------------------------------------------------------------- Fetal Evaluation  Num Of Fetuses:         1  Fetal Heart Rate(bpm):  158  Cardiac Activity:       Observed  Presentation:           Cephalic  Placenta:               Anterior  Amniotic Fluid  AFI FV:      Polyhydramnios  AFI Sum(cm)     %Tile       Largest Pocket(cm)  24.04           92          8.1  RUQ(cm)       RLQ(cm)       LUQ(cm)        LLQ(cm)  8.08          8.1           1.75           6.11 ---------------------------------------------------------------------- Biophysical Evaluation  Amniotic F.V:   Pocket => 2 cm             F. Tone:        Observed  F. Movement:    Observed                   Score:          8/8  F. Breathing:   Observed ---------------------------------------------------------------------- OB History  Blood Type:   O+  Maternal  Racial/Ethnic Group:   Black (non-Hispanic)  Gravidity:    3         Term:   2        Prem:   0        SAB:   0  TOP:          0       Ectopic:  0        Living: 2 ---------------------------------------------------------------------- Gestational Age  Best:          33w 5d     Det. By:  U/S  (07/26/23)          EDD:   09/22/23 ---------------------------------------------------------------------- Anatomy  Stomach:               Appears normal, left   Bladder:                Appears normal                         sided ---------------------------------------------------------------------- Impression  Antenatal testing performed given maternal A2GDM  The biophysical profile was 8/8 with good fetal movement and  borderline polyhydramnios. ---------------------------------------------------------------------- Recommendations  Continue weekly testing as previously scheduled. ----------------------------------------------------------------------              Lin Landsman, MD Electronically Signed Final Report   08/09/2023 11:19 am ----------------------------------------------------------------------    MAU Course: Orders Placed This Encounter  Procedures   CBC   Diet NPO time specified   Care order/instruction: Ultrasound at bedside   Fetal monitoring   Continuous tocometry   Informed Consent Details: Physician/Practitioner Attestation; Transcribe to consent form and obtain patient signature   Initiate Pre-op Protocol   Clip operative site   Place and maintain sequential compression device   Informed Consent Details: Physician/Practitioner Attestation; Transcribe to consent form and obtain patient signature   Verify informed consent   Type and screen [facility]   Insert peripheral IV   Admit to Inpatient (patient's expected length of stay will be greater than 2 midnights or inpatient only procedure)   Meds ordered this encounter  Medications   sodium chloride flush (NS) 0.9 %  injection 10 mL   terbutaline (BRETHINE) injection 0.25 mg   sodium citrate-citric acid (ORACIT) solution 30 mL   ceFAZolin (ANCEF) IVPB 2g/100 mL premix    Order Specific Question:   Indication:    Answer:   Surgical Prophylaxis    Assessment/Plan: Katrina Blackwell is a 37 y.o. female, G3P2002, IUP at 37 weeks, presenting for Version for Breech presentation at 37 weeks, plans for if version is unsuccessful will proceed with PCS, if successful will proceed with IOL.  Pt found to be vertex. And will proceed with IOL for GDM2. Pt endorse + Fm. Denies vaginal leakage. Denies vaginal bleeding. Denies feeling cxt's.   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  Started insulin at 30 weeks,  Currently 15 units NPH in morning, 10 units sliding scale) past pregnancy history of gestational diabetes mellitus (Last pregnancy, on glyburide) rheumatoid arthritis (Seen by primary, no meds.  ?  positive ANA, ROI for records.  Patient will send records after visit in late June to rheumatology.) Tachycardia (Noted at 17 weeks, referred to Dr. Servando Salina, seen 6/28, EKG sinus tach, echo and ZIO monitor ordered.  Neg w/u.) vitamin D deficiency (Noted at NOB, recommended Rx.)  FWB: Cat 1 Fetal Tracing.   Plan: Admit to Peri-Op per consult with DR Su Hilt Plans for if version is unsuccessful will proceed with PCS, if successful will proceed with IOL for GDMA2. . Routine [facility] orders for preop  post results of version vs IOL: Newborn found to be vertex Pain med/epidural prn CBG Q4H if IOL until active labor then Q2H Cytotec VA Q4H.  Anticipate labor progression   [facility] CNM, FNP-C, PMHNP-BC  [id number]  [place], [place] [address]  Cell: [phone]  Office Phone: [phone] Fax: [phone] 09/05/2023  2:30 PM  Pt presented for delivery today d/t GDMA2 per MFM recommendations.  Pt initially scheduled for ECV d/t breech presentation but upon arrival, bedside ultrasound showed the baby to be cephalic.  Pt transferred to L&D for IOL.

## 2023-09-05 NOTE — Progress Notes (Signed)
Pt called at 1219. Pt stated she is on the way to the hospital for version.

## 2023-09-05 NOTE — Progress Notes (Signed)
Labor Progress Note  Katrina Blackwell is a 37 y.o. female, G3P2002, IUP at 37 weeks, presenting for Version for Breech presentation at 37 weeks, plans for if version is unsuccessful will proceed with PCS, if successful will proceed with IOL.  Pt found to be vertex. And will proceed with IOL for GDM2.    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  Started insulin at 30 weeks,  Currently 15 units NPH in morning, 10 units sliding scale) past pregnancy history of gestational diabetes mellitus (Last pregnancy, on glyburide) rheumatoid arthritis (Seen by primary, no meds.  ? positive ANA, ROI for records.  Patient will send records after visit in late June to rheumatology.) Tachycardia (Noted at 17 weeks, referred to Dr. Servando Salina, seen 6/28, EKG sinus tach, echo and ZIO monitor ordered.  Neg w/u.) vitamin D deficiency (Noted at NOB, recommended Rx.)  Subjective: Pt stable and resting well, discussed POC, R/B/A of Cytotec and pitocin. Plan to eat then start with pitocin.   Patient Active Problem List   Diagnosis Date Noted   Malpresentation before onset of labor 09/05/2023   Encounter for induction of labor 09/05/2023   Carrier of spinal muscular atrophy 07/19/2023   AMA (advanced maternal age) multigravida 35+ 07/19/2023   Syncope and collapse 07/07/2023   Insulin controlled gestational diabetes mellitus (GDM) in third trimester 07/07/2023   Traumatic injury during pregnancy  in second trimester 06/27/2023    Objective: BP 131/88   Pulse 100   Temp 98.9 F (37.2 C)   Resp 18   Ht 5\' 6"  (1.676 m)   Wt 80.1 kg   SpO2 98%   BMI 28.49 kg/m  No intake/output data recorded. No intake/output data recorded. NST: FHR baseline 150 bpm, Variability: moderate, Accelerations:present, Decelerations:  Absent= Cat 1/Reactive CTX:  none Uterus gravid, soft non tender, moderate to palpate with contractions.  SVE:  Dilation: Fingertip Station: -3 Exam by:: Dr. Su Hilt Pitocin at N/A mUn/min  Assessment:  Katrina Blackwell, 37 y.o., [id number], with an IUP @ [redacted]w[redacted]d, presented for IOL for GDMA2 Patient Active Problem List   Diagnosis Date Noted   Malpresentation before onset of labor 09/05/2023   Encounter for induction of labor 09/05/2023   Carrier of spinal muscular atrophy 07/19/2023   AMA (advanced maternal age) multigravida 35+ 07/19/2023   Syncope and collapse 07/07/2023   Insulin controlled gestational diabetes mellitus (GDM) in third trimester 07/07/2023   Traumatic injury during pregnancy in second trimester 06/27/2023   NICHD: Category 1  Membranes: Intact, no s/s of infection  Induction:    Cytotec x32mcg VA @   Foley Bulb: N/A Cervix not ready  Pitocin -N?A on cytotec.   Pain management:               IV pain management: x PRN  Nitrous: PRN             Epidural placement: Desires when in pain   GBS Negative  A1GDM: sugars stable,  Started insulin at 30 weeks,  during pregnancy 15 units NPH in morning, 10 units sliding scale, currently  none.  CBG (last 3)  Recent Labs    09/05/23 1437  GLUCAP 64*    Plan: Continue labor plan Continuous monitoring Rest Ambulate CBG Q4H and Q2H in active labor.No meds currently Pt been NPO all day r/t possible PCS if version failed, therefore pt will eat first then get ripening started.  Cytotec Q4H VA fro cervical ripening Frequent position changes to facilitate fetal rotation and descent. Will  reassess with cervical exam at  4 hours or earlier if necessary Continue pitocin per protocol Anticipate labor progression and vaginal delivery.  EBT : 10/18 @ 11:27am.   Md Su Hilt aware of plan and verbalized agreement.   [facility] CNM, FNP-C, PMHNP-BC  [address]  [place], [place] [address]  Cell: [phone]  Office Phone: [phone] Fax: [phone] 09/05/2023  4:07 PM

## 2023-09-05 NOTE — Anesthesia Preprocedure Evaluation (Addendum)
Anesthesia Evaluation  Patient identified by MRN, date of birth, ID band Patient awake    Reviewed: Allergy & Precautions, NPO status , Patient's Chart, lab work & pertinent test results  Airway Mallampati: II       Dental no notable dental hx.    Pulmonary former smoker   Pulmonary exam normal        Cardiovascular negative cardio ROS Normal cardiovascular exam     Neuro/Psych  Headaches  negative psych ROS   GI/Hepatic Neg liver ROS,GERD  Medicated,,  Endo/Other  diabetes, Gestational, Insulin Dependent    Renal/GU negative Renal ROS  negative genitourinary   Musculoskeletal negative musculoskeletal ROS (+)    Abdominal Normal abdominal exam  (+)   Peds  Hematology      Anesthesia Other Findings   Reproductive/Obstetrics (+) Pregnancy                             Anesthesia Physical Anesthesia Plan  ASA: 2  Anesthesia Plan: Epidural   Post-op Pain Management:    Induction:   PONV Risk Score and Plan: 3 and Treatment may vary due to age or medical condition, Ondansetron and Scopolamine patch - Pre-op  Airway Management Planned: Natural Airway and Simple Face Mask  Additional Equipment: None  Intra-op Plan:   Post-operative Plan:   Informed Consent: I have reviewed the patients History and Physical, chart, labs and discussed the procedure including the risks, benefits and alternatives for the proposed anesthesia with the patient or authorized representative who has indicated his/her understanding and acceptance.       Plan Discussed with: CRNA  Anesthesia Plan Comments:        Anesthesia Quick Evaluation

## 2023-09-05 NOTE — Progress Notes (Addendum)
Labor Progress Note Katrina Blackwell is a 37 y.o. G3P2002 at [redacted]w[redacted]d presented for IOL 2/2 GDM, with mild poly   S: No acute concerns.   O:  BP (!) 140/96   Pulse 86   Temp 97.9 F (36.6 C) (Axillary)   Resp 16   Ht 5\' 6"  (1.676 m)   Wt 80.1 kg   SpO2 98%   BMI 28.49 kg/m  EFM: 140bpm/moderate/+accels, no decels  CVE: Dilation: 1 Effacement (%): Thick Cervical Position: Posterior Station: -3 Presentation: Vertex Exam by:: Katrina Kells, RN   A&P: 37 y.o. [id number] [redacted]w[redacted]d here for IOL s/p ECV for A2GDM w/ mild poly.  #Labor: Vagina cytotec q4h #Pain: Maternally supported, planning epidural #FWB: Cat I  #GBS negative  A2GDM Most recent CBG 74 -Dexcom -Revisit insulin/treatment PP  Elevated BP readings since 2 PM Mild range BPs -Follow up preE labs  Gram Siedlecki Autry-Lott, DO 11:43 PM

## 2023-09-06 ENCOUNTER — Inpatient Hospital Stay (HOSPITAL_COMMUNITY): Payer: Medicaid Other | Admitting: Anesthesiology

## 2023-09-06 ENCOUNTER — Other Ambulatory Visit: Payer: Self-pay

## 2023-09-06 ENCOUNTER — Encounter (HOSPITAL_COMMUNITY): Payer: Self-pay | Admitting: Obstetrics and Gynecology

## 2023-09-06 LAB — CBC
HCT: 34 % — ABNORMAL LOW (ref 36.0–46.0)
Hemoglobin: 10.9 g/dL — ABNORMAL LOW (ref 12.0–15.0)
MCH: 27.9 pg (ref 26.0–34.0)
MCHC: 32.1 g/dL (ref 30.0–36.0)
MCV: 87.2 fL (ref 80.0–100.0)
Platelets: 145 10*3/uL — ABNORMAL LOW (ref 150–400)
RBC: 3.9 MIL/uL (ref 3.87–5.11)
RDW: 13.9 % (ref 11.5–15.5)
WBC: 10.7 10*3/uL — ABNORMAL HIGH (ref 4.0–10.5)
nRBC: 0 % (ref 0.0–0.2)

## 2023-09-06 LAB — COMPREHENSIVE METABOLIC PANEL
ALT: 14 U/L (ref 0–44)
AST: 22 U/L (ref 15–41)
Albumin: 2.2 g/dL — ABNORMAL LOW (ref 3.5–5.0)
Alkaline Phosphatase: 139 U/L — ABNORMAL HIGH (ref 38–126)
Anion gap: 11 (ref 5–15)
BUN: 7 mg/dL (ref 6–20)
CO2: 22 mmol/L (ref 22–32)
Calcium: 8.6 mg/dL — ABNORMAL LOW (ref 8.9–10.3)
Chloride: 101 mmol/L (ref 98–111)
Creatinine, Ser: 0.64 mg/dL (ref 0.44–1.00)
GFR, Estimated: >60 mL/min (ref >60)
Glucose, Bld: 108 mg/dL — ABNORMAL HIGH (ref 70–99)
Potassium: 3.8 mmol/L (ref 3.5–5.1)
Sodium: 134 mmol/L — ABNORMAL LOW (ref 135–145)
Total Bilirubin: 0.6 mg/dL (ref 0.3–1.2)
Total Protein: 5.6 g/dL — ABNORMAL LOW (ref 6.5–8.1)

## 2023-09-06 LAB — PROTEIN / CREATININE RATIO, URINE
Creatinine, Urine: 45 mg/dL
Protein Creatinine Ratio: 1.53 mg/mg{creat} — ABNORMAL HIGH (ref 0.00–0.15)
Total Protein, Urine: 69 mg/dL

## 2023-09-06 LAB — RPR: RPR Ser Ql: NONREACTIVE

## 2023-09-06 LAB — GLUCOSE, CAPILLARY: Glucose-Capillary: 78 mg/dL (ref 70–99)

## 2023-09-06 MED ORDER — ACETAMINOPHEN 325 MG PO TABS
650.0000 mg | ORAL_TABLET | ORAL | Status: DC | PRN
  Administered 2023-09-06 – 2023-09-08 (×7): 650 mg via ORAL
  Filled 2023-09-06 (×7): qty 2

## 2023-09-06 MED ORDER — IBUPROFEN 600 MG PO TABS
600.0000 mg | ORAL_TABLET | Freq: Four times a day (QID) | ORAL | Status: DC
  Administered 2023-09-06 – 2023-09-08 (×9): 600 mg via ORAL
  Filled 2023-09-06 (×10): qty 1

## 2023-09-06 MED ORDER — BENZOCAINE-MENTHOL 20-0.5 % EX AERO: 1.0000 | INHALATION_SPRAY | CUTANEOUS | Status: DC | PRN

## 2023-09-06 MED ORDER — DIPHENHYDRAMINE HCL 25 MG PO CAPS: 25.0000 mg | ORAL_CAPSULE | Freq: Four times a day (QID) | ORAL | Status: DC | PRN

## 2023-09-06 MED ORDER — WITCH HAZEL-GLYCERIN EX PADS: 1.0000 | MEDICATED_PAD | CUTANEOUS | Status: DC | PRN

## 2023-09-06 MED ORDER — TERBUTALINE SULFATE 1 MG/ML IJ SOLN: 0.2500 mg | Freq: Once | INTRAMUSCULAR | Status: DC | PRN

## 2023-09-06 MED ORDER — INSULIN ASPART 100 UNIT/ML IJ SOLN: 0.0000 [IU] | Freq: Three times a day (TID) | INTRAMUSCULAR | Status: DC

## 2023-09-06 MED ORDER — SENNOSIDES-DOCUSATE SODIUM 8.6-50 MG PO TABS
2.0000 | ORAL_TABLET | Freq: Every day | ORAL | Status: DC
  Administered 2023-09-07 – 2023-09-08 (×2): 2 via ORAL
  Filled 2023-09-06 (×2): qty 2

## 2023-09-06 MED ORDER — DIBUCAINE (PERIANAL) 1 % EX OINT
1.0000 | TOPICAL_OINTMENT | CUTANEOUS | Status: DC | PRN
  Administered 2023-09-07: 1 via RECTAL
  Filled 2023-09-06: qty 28

## 2023-09-06 MED ORDER — PRENATAL MULTIVITAMIN CH
1.0000 | ORAL_TABLET | Freq: Every day | ORAL | Status: DC
  Administered 2023-09-06 – 2023-09-08 (×3): 1 via ORAL
  Filled 2023-09-06 (×3): qty 1

## 2023-09-06 MED ORDER — LIDOCAINE-EPINEPHRINE (PF) 2 %-1:200000 IJ SOLN
INTRAMUSCULAR | Status: DC | PRN
  Administered 2023-09-06: 5 mL via EPIDURAL

## 2023-09-06 MED ORDER — TETANUS-DIPHTH-ACELL PERTUSSIS 5-2.5-18.5 LF-MCG/0.5 IM SUSY: 0.5000 mL | PREFILLED_SYRINGE | Freq: Once | INTRAMUSCULAR | Status: DC

## 2023-09-06 MED ORDER — LACTATED RINGERS IV SOLN: INTRAVENOUS | Status: DC

## 2023-09-06 MED ORDER — ONDANSETRON HCL 4 MG/2ML IJ SOLN: 4.0000 mg | INTRAMUSCULAR | Status: DC | PRN

## 2023-09-06 MED ORDER — OXYTOCIN-SODIUM CHLORIDE 30-0.9 UT/500ML-% IV SOLN
1.0000 m[IU]/min | INTRAVENOUS | Status: DC
  Administered 2023-09-06: 2 m[IU]/min via INTRAVENOUS

## 2023-09-06 MED ORDER — SIMETHICONE 80 MG PO CHEW
80.0000 mg | CHEWABLE_TABLET | ORAL | Status: DC | PRN
  Administered 2023-09-06: 80 mg via ORAL
  Filled 2023-09-06: qty 1

## 2023-09-06 MED ORDER — COCONUT OIL OIL: 1.0000 | TOPICAL_OIL | Status: DC | PRN

## 2023-09-06 MED ORDER — ONDANSETRON HCL 4 MG PO TABS: 4.0000 mg | ORAL_TABLET | ORAL | Status: DC | PRN

## 2023-09-06 MED ORDER — ZOLPIDEM TARTRATE 5 MG PO TABS: 5.0000 mg | ORAL_TABLET | Freq: Every evening | ORAL | Status: DC | PRN

## 2023-09-06 NOTE — Progress Notes (Signed)
Labor Progress Note Katrina Blackwell is a 37 y.o. G3P2002 at [redacted]w[redacted]d presented for IOL 2/2 GDM, with mild poly   S: RN states she is now resting with her epidural  O:  BP 137/81   Pulse 88   Temp 98.1 F (36.7 C) (Oral)   Resp 16   Ht 5\' 6"  (1.676 m)   Wt 80.1 kg   SpO2 99%   BMI 28.49 kg/m  EFM: 140bpm/moderate/+accels, no decels  CVE: Dilation: 4 Effacement (%): 60 Cervical Position: Posterior Station: -3 Presentation: Vertex Exam by:: Sydney Gillispie - BSU   A&P: 37 y.o. [id number] [redacted]w[redacted]d here for IOL s/p ECV for A2GDM w/ mild poly.  #Labor: Progressing well. S/p vaginal cytotec. Start pitocin. Consider AROM when appropriate #Pain: Epidural #FWB: Cat I  #GBS negative  A2GDM Most recent CBG 78 -Dexcom -Revisit insulin/treatment PP  Mild preeclampsia Mild range BPs. PCR 1.53. Asymptomatic.  Vash Quezada Autry-Lott, DO 4:07 AM

## 2023-09-06 NOTE — Lactation Note (Signed)
This note was copied from a baby's chart. Lactation Consultation Note  Patient Name: Girl Savonnah Leseberg [id number] Date: 09/06/2023 Age:35 hours Reason for consult: Initial assessment;Early term 37-38.6wks  P3- MOB is a very experienced breastfeeding MOB with a history of an oversupply. MOB states that infant nurses well and denies having any concerns.  LC reviewed feeding infant on cue 8-12x in 24 hrs, not allowing infant to go over 3 hrs without a feeding, CDC milk storage guidelines and LC services handout.  Maternal Data Does the patient have breastfeeding experience prior to this delivery?: Yes How long did the patient breastfeed?: 20 m with first child and 23 months with second child.  Feeding Mother's Current Feeding Choice: Breast Milk  Lactation Tools Discussed/Used Pump Education: Milk Storage  Interventions Interventions: Breast feeding basics reviewed;Education;LC Services brochure  Discharge Discharge Education: Warning signs for feeding baby Pump: DEBP;Personal  Consult Status Consult Status: Follow-up Date: 09/07/23 Follow-up type: In-patient    Dema Severin BS, IBCLC 09/06/2023, 3:10 PM

## 2023-09-06 NOTE — Anesthesia Preprocedure Evaluation (Signed)
Anesthesia Evaluation  Patient identified by MRN, date of birth, ID band Patient awake    Reviewed: Allergy & Precautions, NPO status , Patient's Chart, lab work & pertinent test results  Airway Mallampati: II  TM Distance: >3 FB Neck ROM: Full    Dental no notable dental hx.    Pulmonary neg pulmonary ROS, former smoker   Pulmonary exam normal breath sounds clear to auscultation       Cardiovascular negative cardio ROS Normal cardiovascular exam Rhythm:Regular Rate:Normal     Neuro/Psych  Headaches  negative psych ROS   GI/Hepatic negative GI ROS, Neg liver ROS,,,  Endo/Other  diabetes, Gestational, Insulin Dependent    Renal/GU negative Renal ROS  negative genitourinary   Musculoskeletal negative musculoskeletal ROS (+)    Abdominal   Peds  Hematology negative hematology ROS (+)   Anesthesia Other Findings IOL for gDM  Reproductive/Obstetrics (+) Pregnancy                             Anesthesia Physical Anesthesia Plan  ASA: 3  Anesthesia Plan: Epidural   Post-op Pain Management:    Induction:   PONV Risk Score and Plan: Treatment may vary due to age or medical condition  Airway Management Planned: Natural Airway  Additional Equipment:   Intra-op Plan:   Post-operative Plan:   Informed Consent: I have reviewed the patients History and Physical, chart, labs and discussed the procedure including the risks, benefits and alternatives for the proposed anesthesia with the patient or authorized representative who has indicated his/her understanding and acceptance.       Plan Discussed with: Anesthesiologist  Anesthesia Plan Comments: (Patient identified. Risks, benefits, options discussed with patient including but not limited to bleeding, infection, nerve damage, paralysis, failed block, incomplete pain control, headache, blood pressure changes, nausea, vomiting, reactions  to medication, itching, and post partum back pain. Confirmed with bedside nurse the patient's most recent platelet count. Confirmed with the patient that they are not taking any anticoagulation, have any bleeding history or any family history of bleeding disorders. Patient expressed understanding and wishes to proceed. All questions were answered. )       Anesthesia Quick Evaluation

## 2023-09-06 NOTE — Anesthesia Procedure Notes (Signed)
Epidural Patient location during procedure: OB Start time: 09/06/2023 12:10 AM End time: 09/06/2023 12:20 AM  Staffing Anesthesiologist: Elmer Picker, MD Performed: anesthesiologist   Preanesthetic Checklist Completed: patient identified, IV checked, risks and benefits discussed, monitors and equipment checked, pre-op evaluation and timeout performed  Epidural Patient position: sitting Prep: DuraPrep and site prepped and draped Patient monitoring: continuous pulse ox, blood pressure, heart rate and cardiac monitor Approach: midline Location: L3-L4 Injection technique: LOR air  Needle:  Needle type: Tuohy  Needle gauge: 17 G Needle length: 9 cm Needle insertion depth: 5 cm Catheter type: closed end flexible Catheter size: 19 Gauge Catheter at skin depth: 10 cm Test dose: negative  Assessment Sensory level: T8 Events: blood not aspirated, no cerebrospinal fluid, injection not painful, no injection resistance, no paresthesia and negative IV test  Additional Notes Patient identified. Risks/Benefits/Options discussed with patient including but not limited to bleeding, infection, nerve damage, paralysis, failed block, incomplete pain control, headache, blood pressure changes, nausea, vomiting, reactions to medication both or allergic, itching and postpartum back pain. Confirmed with bedside nurse the patient's most recent platelet count. Confirmed with patient that they are not currently taking any anticoagulation, have any bleeding history or any family history of bleeding disorders. Patient expressed understanding and wished to proceed. All questions were answered. Sterile technique was used throughout the entire procedure. Please see nursing notes for vital signs. Test dose was given through epidural catheter and negative prior to continuing to dose epidural or start infusion. Warning signs of high block given to the patient including shortness of breath, tingling/numbness in  hands, complete motor block, or any concerning symptoms with instructions to call for help. Patient was given instructions on fall risk and not to get out of bed. All questions and concerns addressed with instructions to call with any issues or inadequate analgesia.  Reason for block:procedure for pain

## 2023-09-06 NOTE — Progress Notes (Signed)
Per Rhea Pink CNM, ok to use pt's Dexcom for AC/HS glucose checks

## 2023-09-07 MED ORDER — SENNOSIDES-DOCUSATE SODIUM 8.6-50 MG PO TABS: 2.0000 | ORAL_TABLET | Freq: Every day | ORAL | 0 refills | Status: DC

## 2023-09-07 MED ORDER — IBUPROFEN 600 MG PO TABS: 600.0000 mg | ORAL_TABLET | Freq: Four times a day (QID) | ORAL | 0 refills | Status: AC

## 2023-09-07 MED ORDER — SUMATRIPTAN SUCCINATE 50 MG PO TABS
50.0000 mg | ORAL_TABLET | ORAL | Status: DC | PRN
  Administered 2023-09-07: 50 mg via ORAL
  Filled 2023-09-07 (×2): qty 1

## 2023-09-07 MED ORDER — ACETAMINOPHEN 325 MG PO TABS: 650.0000 mg | ORAL_TABLET | ORAL | Status: AC | PRN

## 2023-09-07 NOTE — Progress Notes (Signed)
PPD# 1 SVD w/  Female circumcision labial fusion repaired  Information for the patient's newborn:  Maricia, Mesmer [[id number]]  female   S:   Reports feeling very tired w/ tingling in right hand. Hx of migraines and noticing the onset of a migraine. Requests to restart home meds. Urinary incontinence D/T loss of sensation post epidural - advised to void Q 3 hrs regardless of feeling the urge Declines 24 hr discharge Tolerating PO fluid and solids No nausea or vomiting Bleeding is light, no clots Pain controlled with  PO meds Up ad lib / ambulatory / voiding w/o difficulty Feeding: Breast and formula     O:   VS: BP (!) 123/95 (BP Location: Right Arm)   Pulse 81   Temp 98 F (36.7 C) (Oral)   Resp 16   Ht 5\' 6"  (1.676 m)   Wt 80.1 kg   SpO2 100%   Breastfeeding Unknown   BMI 28.49 kg/m   LABS:  Recent Labs    09/05/23 1449 09/06/23 0652  WBC 5.2 10.7*  HGB 12.4 10.9*  PLT 192 145*   Blood type: --/--/O POS (10/17 1449) Rubella: Immune (04/18 0000)                      I&O: Intake/Output      10/18 0701 10/19 0700 10/19 0701 10/20 0700   I.V. (mL/kg) 74.8 (0.9)    Other 72.2    Total Intake(mL/kg) 147 (1.8)    Urine (mL/kg/hr) 700 (0.4)    Blood 252    Total Output 952    Net -805         Urine Occurrence 0 x      Physical Exam: Alert and oriented X3 Lungs: Clear and unlabored Heart: regular rate and rhythm / no mumurs Abdomen: soft, non-tender, non-distended  Fundus: firm, non-tender, -2 Perineum: approximated, mildly edematous Lochia: appropriate Extremities: trace edema, negative for calf pain, tenderness, or cords    A:  PPD # 1  Normal exam  P:  Routine postpartum orders Void Q 3 hrs Anticipate D/C on PP day 2 Plan reviewed w/ Dr. Kelvin Cellar, DNP, CNM 09/07/2023, 7:05 AM

## 2023-09-07 NOTE — Lactation Note (Signed)
This note was copied from a baby's chart. Lactation Consultation Note  Patient Name: Girl Sedalia Zingaro RUEAV'W Date: 09/07/2023 Age:35 hours Reason for consult: Follow-up assessment;Early term 37-38.6wks;Maternal endocrine disorder  P3, Baby [redacted]w[redacted]d.  Mother states baby is latching and she is supplementing with formula.  Mother aware to increase volume of supplementation.  Room full of visitors so LC did not assist with feeding at this time.  Mother states her nipples are tender.  Discussed chin tug to widen latch for a deeper latch.  Mother will call for help as needed.      Maternal Data Has patient been taught Hand Expression?: Yes Does the patient have breastfeeding experience prior to this delivery?: Yes  Feeding Mother's Current Feeding Choice: Breast Milk and Formula  Interventions Interventions: Education  Discharge Pump: DEBP;Personal  Consult Status Consult Status: Follow-up Date: 09/08/23 Follow-up type: In-patient    Dahlia Byes [facility] 09/07/2023, 2:44 PM

## 2023-09-07 NOTE — Anesthesia Postprocedure Evaluation (Signed)
Anesthesia Post Note  Patient: Ellajane Agena  Procedure(s) Performed: AN AD HOC LABOR EPIDURAL     Patient location during evaluation: Mother Baby Anesthesia Type: Epidural Level of consciousness: awake and alert and oriented Pain management: satisfactory to patient Vital Signs Assessment: post-procedure vital signs reviewed and stable Respiratory status: respiratory function stable Cardiovascular status: stable Postop Assessment: no headache, no backache, epidural receding, patient able to bend at knees, no signs of nausea or vomiting, adequate PO intake and able to ambulate Anesthetic complications: no   No notable events documented.  Last Vitals:  Vitals:   09/07/23 0615 09/07/23 0735  BP: (!) 123/95 129/87  Pulse: 81 81  Resp: 16   Temp: 36.7 C   SpO2: 100%     Last Pain:  Vitals:   09/07/23 0753  TempSrc:   PainSc: 0-No pain   Pain Goal:                   Seraphim Trow

## 2023-09-07 NOTE — Social Work (Signed)
MOB was referred for history of depression/anxiety. * Referral screened out by Clinical Social Worker because none of the following criteria appear to apply: ~ History of anxiety/depression during this pregnancy, or of post-partum depression following prior delivery. ~ Diagnosis of anxiety and/or depression within last 3 years  CSW also received referral for PCP. Member reports she has a PCP and the baby has a pediatrician picked out.   Please contact the Clinical Social Worker if needs arise, by [facility] request, or if MOB scores greater than 9/yes to question 10 on Edinburgh Postpartum Depression Screen.

## 2023-09-08 DIAGNOSIS — N90810 Female genital mutilation status, unspecified: Secondary | ICD-10-CM | POA: Insufficient documentation

## 2023-09-08 DIAGNOSIS — N3941 Urge incontinence: Secondary | ICD-10-CM | POA: Insufficient documentation

## 2023-09-08 LAB — GLUCOSE, CAPILLARY: Glucose-Capillary: 157 mg/dL — ABNORMAL HIGH (ref 70–99)

## 2023-09-08 MED ORDER — NIFEDIPINE ER OSMOTIC RELEASE 30 MG PO TB24
30.0000 mg | ORAL_TABLET | Freq: Every day | ORAL | Status: DC
Start: 2023-09-08 — End: 2023-09-08

## 2023-09-08 MED ORDER — NIFEDIPINE ER 30 MG PO TB24: 30.0000 mg | ORAL_TABLET | Freq: Every day | ORAL | 0 refills | Status: DC

## 2023-09-08 MED ORDER — NIFEDIPINE ER OSMOTIC RELEASE 30 MG PO TB24
30.0000 mg | ORAL_TABLET | Freq: Every day | ORAL | Status: DC
  Administered 2023-09-08: 30 mg via ORAL
  Filled 2023-09-08: qty 1

## 2023-09-08 NOTE — Progress Notes (Signed)
Late entry: Patient's sbp >85 during checks @2052  & 2234. States she has been having headaches since this morning but said it;s due to lack of sleep & a stressful day. No other symptoms. Called Dr. Cole(who's on call per [facility]). I was instructed to call Dr. Salvadore Dom (who is should be on call tonight). Paged Dr. Salvadore Dom & awaiting response.

## 2023-09-08 NOTE — Discharge Summary (Addendum)
Postpartum Discharge Summary      Patient Name: Katrina Blackwell DOB: 07/26/1986 MRN: [id number]  Date of admission: 09/05/2023 Delivery date:09/06/2023 Delivering provider: Lavonda Jumbo Date of discharge: 09/08/2023  Admitting diagnosis: Malpresentation before onset of labor [O32.9XX0] Encounter for induction of labor [Z34.90] Intrauterine pregnancy: [redacted]w[redacted]d     Secondary diagnosis:  Principal Problem:   Malpresentation before onset of labor Active Problems:   Insulin controlled gestational diabetes mellitus (GDM) in third trimester   Carrier of spinal muscular atrophy   AMA (advanced maternal age) multigravida 35+   Encounter for induction of labor   Female circumcision   Urge incontinence  Additional problems: n/a    Discharge diagnosis: Term Pregnancy Delivered, Preeclampsia (mild), and GDM A2                                              Post partum procedures: n/a Augmentation: Pitocin and Cytotec Complications: None  Hospital course: Induction of Labor With Vaginal Delivery   37 y.o. yo G3P3003 at [redacted]w[redacted]d was admitted to the hospital 09/05/2023 for induction of labor.  Indication for induction: A2 DM and mild polyhydramnios .  Patient had an labor course that was complicated by unknown rupture time and no evidence of prior rupture before delivery.  Membrane Rupture Time/Date: 6:08 AM,09/06/2023  Delivery Method:Vaginal, Spontaneous Operative Delivery:N/A Episiotomy: None Lacerations:  None Details of delivery can be found in separate delivery note.  Patient had a postpartum course complicated by elevated blood pressures. She was started on procardia. She also endorse being unable to hold her urine with urinary urgency. She is ambulating without issue. We discussed calling the office to get a referral for pelvic floor PT postpartum as soon as possible. Patient is discharged home 09/08/23.  Newborn Data: Birth date:09/06/2023 Birth time:6:13 AM Gender:Female Living  status:Living Apgars:7 ,8  Weight:2910 g  Magnesium Sulfate received: No BMZ received: No Rhophylac:No MMR:No T-DaP: declined Flu: No, declined RSV Vaccine received: No Transfusion:No Immunizations administered: Immunization History  Administered Date(s) Administered   Influenza-Unspecified 08/08/2016   PFIZER(Purple Top)SARS-COV-2 Vaccination 03/12/2020, 04/09/2020   Tdap 05/14/2013    Physical exam  Vitals:   09/07/23 2234 09/08/23 0408 09/08/23 0751 09/08/23 1054  BP: (!) 125/92 (!) 134/96 (!) 131/93 130/85  Pulse: 93 94 89 (!) 106  Resp:  18    Temp:  98 F (36.7 C)    TempSrc:  Oral    SpO2:  99%    Weight:      Height:       General: alert, cooperative, and no distress Lochia: appropriate Uterine Fundus: firm Incision: N/A DVT Evaluation: No evidence of DVT seen on physical exam. Negative Homan's sign. No cords or calf tenderness. No significant calf/ankle edema. Labs: Lab Results  Component Value Date   WBC 10.7 (H) 09/06/2023   HGB 10.9 (L) 09/06/2023   HCT 34.0 (L) 09/06/2023   MCV 87.2 09/06/2023   PLT 145 (L) 09/06/2023      Latest Ref Rng & Units 09/06/2023    6:52 AM  CMP  Glucose 70 - 99 mg/dL 355   BUN 6 - 20 mg/dL 7   Creatinine 7.32 - 2.02 mg/dL 5.42   Sodium 706 - 237 mmol/L 134   Potassium 3.5 - 5.1 mmol/L 3.8   Chloride 98 - 111 mmol/L 101   CO2 22 - 32  mmol/L 22   Calcium 8.9 - 10.3 mg/dL 8.6   Total Protein 6.5 - 8.1 g/dL 5.6   Total Bilirubin 0.3 - 1.2 mg/dL 0.6   Alkaline Phos 38 - 126 U/L 139   AST 15 - 41 U/L 22   ALT 0 - 44 U/L 14    Edinburgh Score:    09/07/2023    7:30 PM  Edinburgh Postnatal Depression Scale Screening Tool  I have been able to laugh and see the funny side of things. 0  I have looked forward with enjoyment to things. 0  I have blamed myself unnecessarily when things went wrong. 0  I have been anxious or worried for no good reason. 0  I have felt scared or panicky for no good reason. 0  Things  have been getting on top of me. 0  I have been so unhappy that I have had difficulty sleeping. 0  I have felt sad or miserable. 0  I have been so unhappy that I have been crying. 0  The thought of harming myself has occurred to me. 0  Edinburgh Postnatal Depression Scale Total 0      After visit meds:  Allergies as of 09/08/2023   No Known Allergies      Medication List     STOP taking these medications    insulin aspart 100 UNIT/ML injection Commonly known as: novoLOG   insulin NPH Human 100 UNIT/ML injection Commonly known as: NOVOLIN N   ondansetron 8 MG disintegrating tablet Commonly known as: ZOFRAN-ODT   promethazine 12.5 MG tablet Commonly known as: PHENERGAN       TAKE these medications    acetaminophen 325 MG tablet Commonly known as: Tylenol Take 2 tablets (650 mg total) by mouth every 4 (four) hours as needed (for pain scale < 4). What changed:  medication strength how much to take when to take this reasons to take this   calcium carbonate 500 MG chewable tablet Commonly known as: TUMS - dosed in mg elemental calcium Chew 2 tablets by mouth daily as needed for indigestion or heartburn.   ibuprofen 600 MG tablet Commonly known as: ADVIL Take 1 tablet (600 mg total) by mouth every 6 (six) hours.   IRON PO Take 1 tablet by mouth daily.   NIFEdipine 30 MG 24 hr tablet Commonly known as: ADALAT CC Take 1 tablet (30 mg total) by mouth daily. Start taking on: September 09, 2023   pantoprazole 20 MG tablet Commonly known as: PROTONIX Take 1 tablet (20 mg total) by mouth daily. What changed:  when to take this reasons to take this   prenatal multivitamin Tabs tablet Take 1 tablet by mouth daily.   senna-docusate 8.6-50 MG tablet Commonly known as: Senokot-S Take 2 tablets by mouth daily. What changed: how much to take   SUMAtriptan 50 MG tablet Commonly known as: IMITREX Take 50 mg by mouth every 2 (two) hours as needed for migraine. May  repeat in 2 hours if headache persists or recurs.   Vitamin D 50 MCG (2000 UT) tablet Take 2,000 Units by mouth daily.   witch hazel-glycerin pad Commonly known as: TUCKS Apply 1 Application topically as needed for itching.         Discharge home in stable condition Infant Feeding: Bottle Infant Disposition:home with mother Discharge instruction: per After Visit Summary and Postpartum booklet. Activity: Advance as tolerated. Pelvic rest for 6 weeks.  Diet: routine diet Anticipated Birth Control: Unsure, possible tubal Postpartum  Appointment:6 weeks Additional Postpartum F/U: 2 hour GTT in 6 weeks and BP check 1 week. Pelvic floor PT.  Future Appointments: Future Appointments  Date Time Provider Department Center  10/23/2023  9:20 AM Tobb, Lavona Mound, DO [facility] None   Follow up Visit:  Follow-up Information     [facility]. Go in 1 week(s).   Specialty: Obstetrics and Gynecology Why: For a blood pressure check and 6 weeks for your post partum visit. Contact information: [address] [place] [address] [phone]                    09/08/2023 Lavonda Jumbo, DO

## 2023-09-10 LAB — SURGICAL PATHOLOGY

## 2023-09-12 ENCOUNTER — Encounter (HOSPITAL_COMMUNITY): Payer: Self-pay | Admitting: Obstetrics and Gynecology

## 2023-09-12 ENCOUNTER — Inpatient Hospital Stay (HOSPITAL_COMMUNITY)
Admission: AD | Admit: 2023-09-12 | Discharge: 2023-09-12 | Disposition: A | Discharge status: Home / Self Care | Payer: Medicaid Other | Attending: Obstetrics and Gynecology | Admitting: Obstetrics and Gynecology

## 2023-09-12 ENCOUNTER — Inpatient Hospital Stay (HOSPITAL_COMMUNITY): Payer: Medicaid Other

## 2023-09-12 DIAGNOSIS — M79662 Pain in left lower leg: Secondary | ICD-10-CM | POA: Insufficient documentation

## 2023-09-12 DIAGNOSIS — G43909 Migraine, unspecified, not intractable, without status migrainosus: Secondary | ICD-10-CM | POA: Diagnosis not present

## 2023-09-12 DIAGNOSIS — N939 Abnormal uterine and vaginal bleeding, unspecified: Secondary | ICD-10-CM | POA: Insufficient documentation

## 2023-09-12 DIAGNOSIS — R0602 Shortness of breath: Secondary | ICD-10-CM | POA: Diagnosis not present

## 2023-09-12 DIAGNOSIS — Z87891 Personal history of nicotine dependence: Secondary | ICD-10-CM | POA: Insufficient documentation

## 2023-09-12 DIAGNOSIS — G43809 Other migraine, not intractable, without status migrainosus: Secondary | ICD-10-CM | POA: Diagnosis not present

## 2023-09-12 DIAGNOSIS — Z8249 Family history of ischemic heart disease and other diseases of the circulatory system: Secondary | ICD-10-CM | POA: Diagnosis not present

## 2023-09-12 DIAGNOSIS — Z79899 Other long term (current) drug therapy: Secondary | ICD-10-CM | POA: Diagnosis not present

## 2023-09-12 DIAGNOSIS — R0600 Dyspnea, unspecified: Secondary | ICD-10-CM | POA: Diagnosis not present

## 2023-09-12 DIAGNOSIS — I10 Essential (primary) hypertension: Secondary | ICD-10-CM | POA: Diagnosis present

## 2023-09-12 DIAGNOSIS — O9089 Other complications of the puerperium, not elsewhere classified: Secondary | ICD-10-CM | POA: Insufficient documentation

## 2023-09-12 DIAGNOSIS — O165 Unspecified maternal hypertension, complicating the puerperium: Secondary | ICD-10-CM | POA: Diagnosis not present

## 2023-09-12 LAB — COMPREHENSIVE METABOLIC PANEL
ALT: 57 U/L — ABNORMAL HIGH (ref 0–44)
AST: 36 U/L (ref 15–41)
Albumin: 2.6 g/dL — ABNORMAL LOW (ref 3.5–5.0)
Alkaline Phosphatase: 81 U/L (ref 38–126)
Anion gap: 15 (ref 5–15)
BUN: 14 mg/dL (ref 6–20)
CO2: 24 mmol/L (ref 22–32)
Calcium: 9.2 mg/dL (ref 8.9–10.3)
Chloride: 100 mmol/L (ref 98–111)
Creatinine, Ser: 0.78 mg/dL (ref 0.44–1.00)
GFR, Estimated: >60 mL/min (ref >60)
Glucose, Bld: 114 mg/dL — ABNORMAL HIGH (ref 70–99)
Potassium: 3.3 mmol/L — ABNORMAL LOW (ref 3.5–5.1)
Sodium: 139 mmol/L (ref 135–145)
Total Bilirubin: 0.4 mg/dL (ref 0.3–1.2)
Total Protein: 6.1 g/dL — ABNORMAL LOW (ref 6.5–8.1)

## 2023-09-12 LAB — CBC
HCT: 31.1 % — ABNORMAL LOW (ref 36.0–46.0)
Hemoglobin: 10 g/dL — ABNORMAL LOW (ref 12.0–15.0)
MCH: 27.8 pg (ref 26.0–34.0)
MCHC: 32.2 g/dL (ref 30.0–36.0)
MCV: 86.4 fL (ref 80.0–100.0)
Platelets: 227 10*3/uL (ref 150–400)
RBC: 3.6 MIL/uL — ABNORMAL LOW (ref 3.87–5.11)
RDW: 14.3 % (ref 11.5–15.5)
WBC: 6.7 10*3/uL (ref 4.0–10.5)
nRBC: 0 % (ref 0.0–0.2)

## 2023-09-12 MED ORDER — FUROSEMIDE 20 MG PO TABS: 20.0000 mg | ORAL_TABLET | Freq: Every day | ORAL | 0 refills | Status: DC

## 2023-09-12 MED ORDER — CYCLOBENZAPRINE HCL 5 MG PO TABS
10.0000 mg | ORAL_TABLET | Freq: Once | ORAL | Status: AC
  Administered 2023-09-12: 10 mg via ORAL
  Filled 2023-09-12: qty 2

## 2023-09-12 MED ORDER — LABETALOL HCL 200 MG PO TABS: 200.0000 mg | ORAL_TABLET | Freq: Two times a day (BID) | ORAL | 0 refills | Status: DC

## 2023-09-12 MED ORDER — ACETAMINOPHEN-CAFFEINE 500-65 MG PO TABS
2.0000 | ORAL_TABLET | Freq: Once | ORAL | Status: AC
  Administered 2023-09-12: 2 via ORAL
  Filled 2023-09-12: qty 2

## 2023-09-12 MED ORDER — LABETALOL HCL 100 MG PO TABS
200.0000 mg | ORAL_TABLET | Freq: Once | ORAL | Status: AC
  Administered 2023-09-12: 200 mg via ORAL
  Filled 2023-09-12: qty 2

## 2023-09-12 NOTE — MAU Provider Note (Signed)
Chief Complaint:  Hypertension   Event Date/Time   First Provider Initiated Contact with Patient 09/12/23 2040     HPI: Katrina Blackwell is a 37 y.o. [id number] who is 6 days postpartum presents to maternity admissions reporting elevated blood pressure, one sided headache (history of migraines) soreness in left upper thigh.  Is on Labetalol 100mg  but did not take evening dose. . She reports vaginal bleeding, vaginal itching/burning, urinary symptoms, h/a, dizziness, n/v, or fever/chills.    Hypertension This is a recurrent problem. Associated symptoms include headaches. Pertinent negatives include no blurred vision or chest pain. Treatments tried: Labetalol but did not take evening dose. There are no compliance problems.    RN Note: .Katrina Blackwell is a 37 y.o. at Unknown here in MAU reporting svd last Friday. She was seen in the office today and her b/p was 167/104. She was sent here for further eval. She reports a h/a for which she took Ibuprofen 4hrs ago that did not relieve her h/a. She also reports hx blood clots in her family but not herself. Some SOB today. Some pain in her L leg both upper and lower leg. [name] CNM saw pt in Triage. She does take medication for her HTN which she had earlier today  Onset of complaint: today Pain score: 79for h/a  Past Medical History: Past Medical History:  Diagnosis Date   Abnormal glucose tolerance test 12/31/2017   Gestational diabetes    Headache(784.0)    MIGRAINES;OTC   Language barrier 05/12/2013   Speaks english, declines interpreter.    Migraines 05/11/2013    Past obstetric history: OB History  Gravida Para Term Preterm AB Living  3 3 3     3   SAB IAB Ectopic Multiple Live Births        0 3    # Outcome Date GA Lbr Len/2nd Weight Sex Type Anes PTL Lv  3 Term 09/06/23 [redacted]w[redacted]d  2910 g F Vag-Spont EPI  LIV  2 Term 09/09/17 [redacted]w[redacted]d 14:14 / 01:40 2821 g M Vag-Spont EPI  LIV  1 Term 05/12/13 [redacted]w[redacted]d 06:39 / 07:41 2807  g M Vag-Spont None  LIV     Birth Comments: No problems at birth    Past Surgical History: Past Surgical History:  Procedure Laterality Date   IR EMBO VENOUS NOT HEMORR HEMANG  INC GUIDE ROADMAPPING  02/07/2022   IR RADIOLOGIST EVAL & MGMT  01/01/2022   IR RADIOLOGIST EVAL & MGMT  03/09/2022   IR US GUIDE VASC ACCESS RIGHT  02/07/2022   IR VENOGRAM RENAL UNI LEFT  02/07/2022   TONSILLECTOMY  2009    Family History: Family History  Problem Relation Age of Onset   Stroke Mother    Asthma Mother    Hyperlipidemia Mother    Diabetes Father    Heart disease Father    Other Neg Hx     Social History: Social History   Tobacco Use   Smoking status: Former    Types: Cigarettes   Smokeless tobacco: Never  Vaping Use   Vaping status: Never Used  Substance Use Topics   Alcohol use: No   Drug use: No    Allergies: No Known Allergies  Meds:  Medications Prior to Admission  Medication Sig Dispense Refill Last Dose   labetalol (NORMODYNE) 100 MG tablet Take 100 mg by mouth 2 (two) times daily.   09/12/2023 at 1100   Prenatal Vit-Fe Fumarate-FA (PRENATAL MULTIVITAMIN) TABS tablet Take 1 tablet  by mouth daily.   09/12/2023 at 1100   senna-docusate (SENOKOT-S) 8.6-50 MG tablet Take 2 tablets by mouth daily. 60 tablet 0 09/12/2023 at 1100   witch hazel-glycerin (TUCKS) pad Apply 1 Application topically as needed for itching. 40 each 12 09/12/2023   acetaminophen (TYLENOL) 325 MG tablet Take 2 tablets (650 mg total) by mouth every 4 (four) hours as needed (for pain scale < 4).      calcium carbonate (TUMS - DOSED IN MG ELEMENTAL CALCIUM) 500 MG chewable tablet Chew 2 tablets by mouth daily as needed for indigestion or heartburn.      Cholecalciferol (VITAMIN D) 50 MCG (2000 UT) tablet Take 2,000 Units by mouth daily.      Ferrous Sulfate (IRON PO) Take 1 tablet by mouth daily.      ibuprofen (ADVIL) 600 MG tablet Take 1 tablet (600 mg total) by mouth every 6 (six) hours. 30 tablet 0     NIFEdipine (ADALAT CC) 30 MG 24 hr tablet Take 1 tablet (30 mg total) by mouth daily. 30 tablet 0    pantoprazole (PROTONIX) 20 MG tablet Take 1 tablet (20 mg total) by mouth daily. (Patient taking differently: Take 20 mg by mouth daily as needed for heartburn.) 30 tablet 1    SUMAtriptan (IMITREX) 50 MG tablet Take 50 mg by mouth every 2 (two) hours as needed for migraine. May repeat in 2 hours if headache persists or recurs.       I have reviewed patient's Past Medical Hx, Surgical Hx, Family Hx, Social Hx, medications and allergies.  ROS:  Review of Systems  Eyes:  Negative for blurred vision.  Cardiovascular:  Negative for chest pain.  Neurological:  Positive for headaches.   Other systems negative     Physical Exam  Patient Vitals for the past 24 hrs:  BP Pulse Resp SpO2 Height Weight  09/12/23 2045 (!) 143/90 75 -- 100 % -- --  09/12/23 2037 (!) 148/91 77 -- -- -- --  09/12/23 2020 (!) 155/96 -- -- -- -- --  09/12/23 2013 -- 70 18 100 % 5\' 6"  (1.676 m) 74.8 kg    Constitutional: Well-developed, well-nourished female in no acute distress.  Cardiovascular: normal rate and rhythm Respiratory: normal effort, no distress. Does not appear dyspneic.  Lungs CTAB, no wheezing or crackles GI: Abd soft, non-tender.  Nondistended.  No rebound, No guarding.  Bowel Sounds audible  MS: Extremities nontender, Trace edema, normal ROM Neurologic: Alert and oriented x 4.   Grossly nonfocal. GU: Neg CVAT. Skin:  Warm and Dry Psych:  Affect appropriate.   Labs: Results for orders placed or performed during the hospital encounter of 09/12/23 (from the past 24 hour(s))  CBC     Status: Abnormal   Collection Time: 09/12/23  8:32 PM  Result Value Ref Range   WBC 6.7 4.0 - 10.5 K/uL   RBC 3.60 (L) 3.87 - 5.11 MIL/uL   Hemoglobin 10.0 (L) 12.0 - 15.0 g/dL   HCT 16.1 (L) 09.6 - 04.5 %   MCV 86.4 80.0 - 100.0 fL   MCH 27.8 26.0 - 34.0 pg   MCHC 32.2 30.0 - 36.0 g/dL   RDW 40.9 81.1 - 91.4 %    Platelets 227 150 - 400 K/uL   nRBC 0.0 0.0 - 0.2 %  Comprehensive metabolic panel     Status: Abnormal   Collection Time: 09/12/23  8:32 PM  Result Value Ref Range   Sodium 139 135 - 145  mmol/L   Potassium 3.3 (L) 3.5 - 5.1 mmol/L   Chloride 100 98 - 111 mmol/L   CO2 24 22 - 32 mmol/L   Glucose, Bld 114 (H) 70 - 99 mg/dL   BUN 14 6 - 20 mg/dL   Creatinine, Ser 3.24 0.44 - 1.00 mg/dL   Calcium 9.2 8.9 - 40.1 mg/dL   Total Protein 6.1 (L) 6.5 - 8.1 g/dL   Albumin 2.6 (L) 3.5 - 5.0 g/dL   AST 36 15 - 41 U/L   ALT 57 (H) 0 - 44 U/L   Alkaline Phosphatase 81 38 - 126 U/L   Total Bilirubin 0.4 0.3 - 1.2 mg/dL   GFR, Estimated >02 >72 mL/min   Anion gap 15 5 - 15    --/--/O POS (10/17 1449)  Imaging:  DG Chest Portable 1 View  Result Date: 09/12/2023 CLINICAL DATA:  Dyspnea EXAM: PORTABLE CHEST 1 VIEW COMPARISON:  None Available. FINDINGS: Left perihilar nodular density represents a confluence of vascular and osseous shadows. Lungs are clear. No pneumothorax or pleural effusion. Cardiac size within normal limits. No acute bone abnormality. IMPRESSION: 1. No active disease. Electronically Signed   By: Helyn Numbers M.D.   On: 09/12/2023 21:23      MAU Course/MDM: I have reviewed the triage vital signs and the nursing notes.   Pertinent labs & imaging results that were available during my care of the patient were reviewed by me and considered in my medical decision making (see chart for details).      I have reviewed her medical records including past results, notes and treatments.   I have ordered labs as follows: CBC and CMET.  ALT slightly elevated but not doubled.  Imaging ordered: Chest xray was normal  Results reviewed.   Consult Dr Despina Hidden. Will increase Labetalol and add Lasix.  Recommends treating headache with Imitrex, likely vascular/migraine.   Treatments in MAU included Excedrin Tension and Flexeril which did not help headache much. Declines Imitrex, will take it at  home.   Pt stable at time of discharge.  Assessment: Postpartum hypertension Migraine headache Shortness of breath  Plan: Discharge home Recommend Take imitrex at home Rx sent for Increased Labetalol dose from 100mg  to 200mg  for HTN Rx Lasix 20mg  daily x 5 days Call office to arrange BP check for Monday Encouraged to return here or to other Urgent Care/ED if she develops worsening of symptoms, increase in pain, fever, or other concerning symptoms.   [name] CNM, MSN Certified Nurse-Midwife 09/12/2023 9:06 PM

## 2023-09-12 NOTE — MAU Note (Signed)
.  Katrina Blackwell is a 37 y.o. at Unknown here in MAU reporting svd last Friday. She was seen in the office today and her b/p was 167/104. She was sent here for further eval. She reports a h/a for which she took Ibuprofen 4hrs ago that did not relieve her h/a. She also reports hx blood clots in her family but not herself. Some SOB today. Some pain in her L leg both upper and lower leg. [name] CNM saw pt in Triage. She does take medication for her HTN which she had earlier today  Onset of complaint: today Pain score: 71for h/a Vitals:   09/12/23 2013  Pulse: 70  Resp: 18  SpO2: 100%     FHT:n/a Lab orders placed from triage:  labs placed by provider.

## 2023-09-17 ENCOUNTER — Inpatient Hospital Stay (HOSPITAL_COMMUNITY)
Admission: AD | Admit: 2023-09-17 | Discharge: 2023-09-21 | DRG: 776 | Disposition: A | Discharge status: Home / Self Care | Payer: Medicaid Other | Attending: Obstetrics and Gynecology | Admitting: Obstetrics and Gynecology

## 2023-09-17 ENCOUNTER — Inpatient Hospital Stay (HOSPITAL_COMMUNITY): Payer: Medicaid Other

## 2023-09-17 ENCOUNTER — Ambulatory Visit (HOSPITAL_COMMUNITY): Payer: Medicaid Other

## 2023-09-17 ENCOUNTER — Other Ambulatory Visit: Payer: Self-pay

## 2023-09-17 ENCOUNTER — Encounter (HOSPITAL_COMMUNITY): Payer: Self-pay | Admitting: Obstetrics and Gynecology

## 2023-09-17 DIAGNOSIS — Z8669 Personal history of other diseases of the nervous system and sense organs: Secondary | ICD-10-CM

## 2023-09-17 DIAGNOSIS — Z823 Family history of stroke: Secondary | ICD-10-CM | POA: Diagnosis not present

## 2023-09-17 DIAGNOSIS — O99355 Diseases of the nervous system complicating the puerperium: Secondary | ICD-10-CM | POA: Diagnosis present

## 2023-09-17 DIAGNOSIS — Z87891 Personal history of nicotine dependence: Secondary | ICD-10-CM

## 2023-09-17 DIAGNOSIS — R519 Headache, unspecified: Secondary | ICD-10-CM | POA: Diagnosis present

## 2023-09-17 DIAGNOSIS — Z825 Family history of asthma and other chronic lower respiratory diseases: Secondary | ICD-10-CM | POA: Diagnosis not present

## 2023-09-17 DIAGNOSIS — O1415 Severe pre-eclampsia, complicating the puerperium: Secondary | ICD-10-CM | POA: Diagnosis not present

## 2023-09-17 DIAGNOSIS — Z83438 Family history of other disorder of lipoprotein metabolism and other lipidemia: Secondary | ICD-10-CM

## 2023-09-17 DIAGNOSIS — G43909 Migraine, unspecified, not intractable, without status migrainosus: Secondary | ICD-10-CM | POA: Diagnosis present

## 2023-09-17 DIAGNOSIS — Z8249 Family history of ischemic heart disease and other diseases of the circulatory system: Secondary | ICD-10-CM | POA: Diagnosis not present

## 2023-09-17 DIAGNOSIS — G43901 Migraine, unspecified, not intractable, with status migrainosus: Secondary | ICD-10-CM | POA: Diagnosis not present

## 2023-09-17 DIAGNOSIS — O1490 Unspecified pre-eclampsia, unspecified trimester: Secondary | ICD-10-CM | POA: Diagnosis present

## 2023-09-17 DIAGNOSIS — O165 Unspecified maternal hypertension, complicating the puerperium: Secondary | ICD-10-CM | POA: Diagnosis not present

## 2023-09-17 DIAGNOSIS — Z833 Family history of diabetes mellitus: Secondary | ICD-10-CM | POA: Diagnosis not present

## 2023-09-17 DIAGNOSIS — O1495 Unspecified pre-eclampsia, complicating the puerperium: Principal | ICD-10-CM | POA: Diagnosis present

## 2023-09-17 LAB — CBC
HCT: 36.9 % (ref 36.0–46.0)
Hemoglobin: 11.7 g/dL — ABNORMAL LOW (ref 12.0–15.0)
MCH: 27.3 pg (ref 26.0–34.0)
MCHC: 31.7 g/dL (ref 30.0–36.0)
MCV: 86.2 fL (ref 80.0–100.0)
Platelets: 298 10*3/uL (ref 150–400)
RBC: 4.28 MIL/uL (ref 3.87–5.11)
RDW: 14.2 % (ref 11.5–15.5)
WBC: 4.8 10*3/uL (ref 4.0–10.5)
nRBC: 0 % (ref 0.0–0.2)

## 2023-09-17 LAB — COMPREHENSIVE METABOLIC PANEL
ALT: 31 U/L (ref 0–44)
AST: 23 U/L (ref 15–41)
Albumin: 3.1 g/dL — ABNORMAL LOW (ref 3.5–5.0)
Alkaline Phosphatase: 76 U/L (ref 38–126)
Anion gap: 8 (ref 5–15)
BUN: 6 mg/dL (ref 6–20)
CO2: 28 mmol/L (ref 22–32)
Calcium: 9.4 mg/dL (ref 8.9–10.3)
Chloride: 103 mmol/L (ref 98–111)
Creatinine, Ser: 0.65 mg/dL (ref 0.44–1.00)
GFR, Estimated: >60 mL/min (ref >60)
Glucose, Bld: 93 mg/dL (ref 70–99)
Potassium: 3.7 mmol/L (ref 3.5–5.1)
Sodium: 139 mmol/L (ref 135–145)
Total Bilirubin: 0.4 mg/dL (ref 0.3–1.2)
Total Protein: 6.9 g/dL (ref 6.5–8.1)

## 2023-09-17 LAB — TYPE AND SCREEN
ABO/RH(D): O POS
Antibody Screen: NEGATIVE

## 2023-09-17 MED ORDER — GADOBUTROL 1 MMOL/ML IV SOLN
7.0000 mL | Freq: Once | INTRAVENOUS | Status: AC | PRN
  Administered 2023-09-17: 7 mL via INTRAVENOUS

## 2023-09-17 MED ORDER — CALCIUM CARBONATE ANTACID 500 MG PO CHEW: 2.0000 | CHEWABLE_TABLET | ORAL | Status: DC | PRN

## 2023-09-17 MED ORDER — ACETAMINOPHEN 325 MG PO TABS: 650.0000 mg | ORAL_TABLET | ORAL | Status: DC | PRN

## 2023-09-17 MED ORDER — ACETAMINOPHEN-CAFFEINE 500-65 MG PO TABS
2.0000 | ORAL_TABLET | Freq: Once | ORAL | Status: AC
  Administered 2023-09-17: 2 via ORAL
  Filled 2023-09-17: qty 2

## 2023-09-17 MED ORDER — MAGNESIUM SULFATE 40 GM/1000ML IV SOLN
2.0000 g/h | INTRAVENOUS | Status: AC
  Administered 2023-09-18: 2 g/h via INTRAVENOUS
  Filled 2023-09-17 (×2): qty 1000

## 2023-09-17 MED ORDER — HYDRALAZINE HCL 20 MG/ML IJ SOLN
10.0000 mg | INTRAMUSCULAR | Status: DC | PRN
  Filled 2023-09-17: qty 1

## 2023-09-17 MED ORDER — LABETALOL HCL 200 MG PO TABS
200.0000 mg | ORAL_TABLET | Freq: Two times a day (BID) | ORAL | Status: DC
  Administered 2023-09-17 – 2023-09-19 (×5): 200 mg via ORAL
  Filled 2023-09-17 (×5): qty 1

## 2023-09-17 MED ORDER — LABETALOL HCL 5 MG/ML IV SOLN
20.0000 mg | INTRAVENOUS | Status: DC | PRN
  Administered 2023-09-17 (×2): 20 mg via INTRAVENOUS
  Filled 2023-09-17 (×2): qty 4

## 2023-09-17 MED ORDER — LACTATED RINGERS IV SOLN
INTRAVENOUS | Status: AC
Start: 2023-09-17 — End: 2023-09-18

## 2023-09-17 MED ORDER — ENOXAPARIN SODIUM 40 MG/0.4ML IJ SOSY
40.0000 mg | PREFILLED_SYRINGE | INTRAMUSCULAR | Status: DC
  Administered 2023-09-17 – 2023-09-20 (×4): 40 mg via SUBCUTANEOUS
  Filled 2023-09-17 (×4): qty 0.4

## 2023-09-17 MED ORDER — BUTALBITAL-APAP-CAFFEINE 50-325-40 MG PO TABS
1.0000 | ORAL_TABLET | Freq: Four times a day (QID) | ORAL | Status: DC | PRN
  Administered 2023-09-17 – 2023-09-19 (×4): 1 via ORAL
  Filled 2023-09-17 (×5): qty 1

## 2023-09-17 MED ORDER — LABETALOL HCL 5 MG/ML IV SOLN
80.0000 mg | INTRAVENOUS | Status: DC | PRN
  Administered 2023-09-17: 80 mg via INTRAVENOUS
  Filled 2023-09-17: qty 16

## 2023-09-17 MED ORDER — SUMATRIPTAN SUCCINATE 50 MG PO TABS
50.0000 mg | ORAL_TABLET | ORAL | Status: AC | PRN
Start: 2023-09-17 — End: 2023-09-18
  Administered 2023-09-17 – 2023-09-18 (×2): 50 mg via ORAL
  Filled 2023-09-17 (×4): qty 1

## 2023-09-17 MED ORDER — MAGNESIUM SULFATE BOLUS VIA INFUSION
4.0000 g | Freq: Once | INTRAVENOUS | Status: AC
  Administered 2023-09-17: 4 g via INTRAVENOUS
  Filled 2023-09-17: qty 1000

## 2023-09-17 MED ORDER — LABETALOL HCL 5 MG/ML IV SOLN
40.0000 mg | INTRAVENOUS | Status: DC | PRN
  Administered 2023-09-17 (×2): 40 mg via INTRAVENOUS
  Filled 2023-09-17 (×2): qty 8

## 2023-09-17 MED ORDER — PRENATAL MULTIVITAMIN CH
1.0000 | ORAL_TABLET | Freq: Every day | ORAL | Status: DC
  Administered 2023-09-18 – 2023-09-20 (×3): 1 via ORAL
  Filled 2023-09-17 (×3): qty 1

## 2023-09-17 MED ORDER — DOCUSATE SODIUM 100 MG PO CAPS
100.0000 mg | ORAL_CAPSULE | Freq: Every day | ORAL | Status: DC
  Administered 2023-09-18 – 2023-09-20 (×3): 100 mg via ORAL
  Filled 2023-09-17 (×3): qty 1

## 2023-09-17 NOTE — H&P (Addendum)
Katrina Blackwell is an 37 y.o. female. Pt being admitted for Pre-E with severe features d/t severe range BPs and HA.  Also c/o swelling in LEs and getting dopplers fo LEs and MRI of the brain d/t HA and strong family h/o VTE.  Report given to me by [facility] Fellow in MAU.  HA is different from her typical migraine.  Pt is s/p labetalol protocol in MAU.  Pertinent Gynecological History: S/p NSVD 09/06/23  Menstrual History:  No LMP recorded.    Past Medical History:  Diagnosis Date   Abnormal glucose tolerance test 12/31/2017   Gestational diabetes    Headache(784.0)    MIGRAINES;OTC   Language barrier 05/12/2013   Speaks english, declines interpreter.    Migraines 05/11/2013    Past Surgical History:  Procedure Laterality Date   IR EMBO VENOUS NOT HEMORR HEMANG  INC GUIDE ROADMAPPING  02/07/2022   IR RADIOLOGIST EVAL & MGMT  01/01/2022   IR RADIOLOGIST EVAL & MGMT  03/09/2022   IR US GUIDE VASC ACCESS RIGHT  02/07/2022   IR VENOGRAM RENAL UNI LEFT  02/07/2022   TONSILLECTOMY  2009    Family History  Problem Relation Age of Onset   Stroke Mother    Asthma Mother    Hyperlipidemia Mother    Diabetes Father    Heart disease Father    Other Neg Hx     Social History:  reports that she has quit smoking. Her smoking use included cigarettes. She has never used smokeless tobacco. She reports that she does not drink alcohol and does not use drugs.  Allergies: No Known Allergies  Medications Prior to Admission  Medication Sig Dispense Refill Last Dose   amLODipine (NORVASC) 5 MG tablet Take 5 mg by mouth daily.   09/17/2023 at 1200   ibuprofen (ADVIL) 600 MG tablet Take 1 tablet (600 mg total) by mouth every 6 (six) hours. 30 tablet 0 09/17/2023 at 1500   labetalol (NORMODYNE) 200 MG tablet Take 1 tablet (200 mg total) by mouth every 12 (twelve) hours. 60 tablet 0 09/17/2023 at 1100   Prenatal Vit-Fe Fumarate-FA (PRENATAL MULTIVITAMIN) TABS tablet Take 1 tablet by mouth daily.    09/16/2023   SUMAtriptan (IMITREX) 50 MG tablet Take 50 mg by mouth every 2 (two) hours as needed for migraine. May repeat in 2 hours if headache persists or recurs.   09/17/2023 at 0400   acetaminophen (TYLENOL) 325 MG tablet Take 2 tablets (650 mg total) by mouth every 4 (four) hours as needed (for pain scale < 4).      calcium carbonate (TUMS - DOSED IN MG ELEMENTAL CALCIUM) 500 MG chewable tablet Chew 2 tablets by mouth daily as needed for indigestion or heartburn.      Cholecalciferol (VITAMIN D) 50 MCG (2000 UT) tablet Take 2,000 Units by mouth daily.      Ferrous Sulfate (IRON PO) Take 1 tablet by mouth daily.      furosemide (LASIX) 20 MG tablet Take 1 tablet (20 mg total) by mouth daily for 5 days. 5 tablet 0    pantoprazole (PROTONIX) 20 MG tablet Take 1 tablet (20 mg total) by mouth daily. (Patient taking differently: Take 20 mg by mouth daily as needed for heartburn.) 30 tablet 1    senna-docusate (SENOKOT-S) 8.6-50 MG tablet Take 2 tablets by mouth daily. 60 tablet 0    witch hazel-glycerin (TUCKS) pad Apply 1 Application topically as needed for itching. 40 each 12  Review of Systems Denies F/C/N/V/D  Blood pressure (!) 155/98, pulse 66, temperature 98.3 F (36.8 C), temperature source Oral, resp. rate 17, height 5\' 6"  (1.676 m), weight 71.5 kg, SpO2 98%, currently breastfeeding. Physical Exam Lungs unlabored CV RRR Abdomen soft, NT Extremities no calf tenderness  Results for orders placed or performed during the hospital encounter of 09/17/23 (from the past 24 hour(s))  CBC     Status: Abnormal   Collection Time: 09/17/23  3:57 PM  Result Value Ref Range   WBC 4.8 4.0 - 10.5 K/uL   RBC 4.28 3.87 - 5.11 MIL/uL   Hemoglobin 11.7 (L) 12.0 - 15.0 g/dL   HCT 27.2 53.6 - 64.4 %   MCV 86.2 80.0 - 100.0 fL   MCH 27.3 26.0 - 34.0 pg   MCHC 31.7 30.0 - 36.0 g/dL   RDW 03.4 74.2 - 59.5 %   Platelets 298 150 - 400 K/uL   nRBC 0.0 0.0 - 0.2 %  Comprehensive metabolic panel      Status: Abnormal   Collection Time: 09/17/23  3:57 PM  Result Value Ref Range   Sodium 139 135 - 145 mmol/L   Potassium 3.7 3.5 - 5.1 mmol/L   Chloride 103 98 - 111 mmol/L   CO2 28 22 - 32 mmol/L   Glucose, Bld 93 70 - 99 mg/dL   BUN 6 6 - 20 mg/dL   Creatinine, Ser 6.38 0.44 - 1.00 mg/dL   Calcium 9.4 8.9 - 75.6 mg/dL   Total Protein 6.9 6.5 - 8.1 g/dL   Albumin 3.1 (L) 3.5 - 5.0 g/dL   AST 23 15 - 41 U/L   ALT 31 0 - 44 U/L   Alkaline Phosphatase 76 38 - 126 U/L   Total Bilirubin 0.4 0.3 - 1.2 mg/dL   GFR, Estimated >43 >32 mL/min   Anion gap 8 5 - 15   Neg Cxray  No results found.  Assessment/Plan: 37yo [id number] s/p NSVD 09/06/23 being admitted for pre-eclampsia with severe features d/t severe BPs and HA.  Pt also c/o bilateral LE swelling. 1.Pre-eclampsia w/severe features - started on Mg for 24hrs Recheck labs with a Mg level in the morning.  Labetalol protocol. 2.Bilateral LE - doppler of LEs negative 3.HA - MRI of brain pending 4.Family h/o VTE - SCDs for DVT prophylaxis and consider lovenox with neg MRI.  Results are still pending.  Purcell Nails 09/17/2023, 6:18 PM

## 2023-09-17 NOTE — MAU Provider Note (Signed)
Chief Complaint  Patient presents with   Headache   Hypertension   HPI Katrina Blackwell is a 37 y.o. [id number] s/p SVD on 10/18. Pregnancy c/b A2GDM, polyhydramnios for which proceeded w IOL. Labor course uncomplicated and postpartum course notable for mild range blood pressures. She had been followed closely for her postpartum hypertension and currently is on Labetalol 200mg  bid and just started on Amlodipine 5mg . She reports since discharge from hospital, she has had elevated Bps typically in the 160s/100s. She reports a BP of 168/105 earlier today for which she was referred to MAU for further evaluation. She endorses severe headache, rated 10/10 which is not similar to her previous migraines. Also w blurred vision that acutely started. She endorses mild SOB. No RUQ pain, worsening edema. Endorses left upper thigh pain. She has known strong fhx VTE -- mom and sister both required anticoagulant postpartum, and mom has hx strokes related to her hypercoaguable state and is concerned about DVT and CVA.  Past Medical History:  Diagnosis Date   Abnormal glucose tolerance test 12/31/2017   Gestational diabetes    Headache(784.0)    MIGRAINES;OTC   Language barrier 05/12/2013   Speaks english, declines interpreter.    Migraines 05/11/2013    Past Surgical History:  Procedure Laterality Date   IR EMBO VENOUS NOT HEMORR HEMANG  INC GUIDE ROADMAPPING  02/07/2022   IR RADIOLOGIST EVAL & MGMT  01/01/2022   IR RADIOLOGIST EVAL & MGMT  03/09/2022   IR US GUIDE VASC ACCESS RIGHT  02/07/2022   IR VENOGRAM RENAL UNI LEFT  02/07/2022   TONSILLECTOMY  2009    Family History  Problem Relation Age of Onset   Stroke Mother    Asthma Mother    Hyperlipidemia Mother    Diabetes Father    Heart disease Father    Other Neg Hx     Social History   Tobacco Use   Smoking status: Former    Types: Cigarettes   Smokeless tobacco: Never  Vaping Use   Vaping status: Never Used  Substance Use Topics    Alcohol use: No   Drug use: No    Allergies: No Known Allergies  Medications Prior to Admission  Medication Sig Dispense Refill Last Dose   amLODipine (NORVASC) 5 MG tablet Take 5 mg by mouth daily.   09/17/2023 at 1200   ibuprofen (ADVIL) 600 MG tablet Take 1 tablet (600 mg total) by mouth every 6 (six) hours. 30 tablet 0 09/17/2023 at 1500   labetalol (NORMODYNE) 200 MG tablet Take 1 tablet (200 mg total) by mouth every 12 (twelve) hours. 60 tablet 0 09/17/2023 at 1100   Prenatal Vit-Fe Fumarate-FA (PRENATAL MULTIVITAMIN) TABS tablet Take 1 tablet by mouth daily.   09/16/2023   SUMAtriptan (IMITREX) 50 MG tablet Take 50 mg by mouth every 2 (two) hours as needed for migraine. May repeat in 2 hours if headache persists or recurs.   09/17/2023 at 0400   acetaminophen (TYLENOL) 325 MG tablet Take 2 tablets (650 mg total) by mouth every 4 (four) hours as needed (for pain scale < 4).      calcium carbonate (TUMS - DOSED IN MG ELEMENTAL CALCIUM) 500 MG chewable tablet Chew 2 tablets by mouth daily as needed for indigestion or heartburn.      Cholecalciferol (VITAMIN D) 50 MCG (2000 UT) tablet Take 2,000 Units by mouth daily.      Ferrous Sulfate (IRON PO) Take 1 tablet by mouth daily.  furosemide (LASIX) 20 MG tablet Take 1 tablet (20 mg total) by mouth daily for 5 days. 5 tablet 0    pantoprazole (PROTONIX) 20 MG tablet Take 1 tablet (20 mg total) by mouth daily. (Patient taking differently: Take 20 mg by mouth daily as needed for heartburn.) 30 tablet 1    senna-docusate (SENOKOT-S) 8.6-50 MG tablet Take 2 tablets by mouth daily. 60 tablet 0    witch hazel-glycerin (TUCKS) pad Apply 1 Application topically as needed for itching. 40 each 12    ROS reviewed and pertinent positives and negatives as documented in HPI.   Physical Exam Blood pressure (!) 158/102, pulse 67, temperature 98.3 F (36.8 C), temperature source Oral, resp. rate 16, height 5\' 6"  (1.676 m), weight 71.5 kg, SpO2 100%,  currently breastfeeding. Physical Exam Constitutional:      General: She is not in acute distress.    Appearance: Normal appearance.  HENT:     Head: Normocephalic and atraumatic.  Eyes:     General: No visual field deficit. Cardiovascular:     Rate and Rhythm: Normal rate and regular rhythm.     Heart sounds: Normal heart sounds.  Pulmonary:     Effort: Pulmonary effort is normal. No respiratory distress.     Breath sounds: Normal breath sounds.  Abdominal:     General: There is no distension.     Palpations: Abdomen is soft.     Tenderness: There is no abdominal tenderness. There is no right CVA tenderness or left CVA tenderness.     Comments: +left superior aspect of lower extremity tenderness with palpation  Musculoskeletal:        General: Normal range of motion.  Skin:    General: Skin is warm and dry.     Findings: No rash.  Neurological:     General: No focal deficit present.     Mental Status: She is alert and oriented to person, place, and time.     Cranial Nerves: No cranial nerve deficit, dysarthria or facial asymmetry.     Sensory: No sensory deficit.     Motor: No weakness.     Coordination: Coordination normal.     Deep Tendon Reflexes: Reflexes normal.  Psychiatric:        Mood and Affect: Mood normal.        Behavior: Behavior normal.    MAU Course Rumaisa Shantia Funderburke is a 37 y.o. [id number] PPD#11 here with severe range BP, worsening headache and vision changes. BP in severe range on presentation. Exam is reassuring. Workup for PEC includes -- normal PLT, normal LFTs and Cr. Given severe range BP and intractable headache with vision changes, started on Magnesium -- 4g bolus and 2g/hr gtt. Given 2 doses of Labetalol per protocol. CXR, MRI/MRA of head/brain, and bilateral lower extremity duplex ordered to eval for pulmonary edema, CVA vs PRES, and DVT of LLE, respectively. Awaiting workup as above. Patient discussed with Dr. Su Hilt who will admit pt for  further eval/management. BP improved after initiation of tx as above at point of admission.  Sundra Aland, MD OB Fellow, [facility], [facility] 09/17/23 8:45 PM

## 2023-09-17 NOTE — MAU Note (Addendum)
Katrina Blackwell is a 37 y.o. here in MAU reporting: PP, vag 10/18.  Here today c/o of elevated BP and HA.  Denies BP problems with preg.  Has taken Tylenol, Ibuprofen not working.  Taken migraine med, though feels it is not a migraine because it is on both side "all in her head and neck".  Onset of complaint: "it was always with her, but getting worse every day" Pain score: 10 Vitals:   09/17/23 1541  BP: (!) 143/101  Pulse: 73  Resp: 15  Temp: 98.6 F (37 C)  SpO2: 100%      Lab orders placed from triage:    Was started on a second BP med yesterday, taking both.

## 2023-09-18 LAB — CBC
HCT: 38.5 % (ref 36.0–46.0)
Hemoglobin: 12.3 g/dL (ref 12.0–15.0)
MCH: 27.3 pg (ref 26.0–34.0)
MCHC: 31.9 g/dL (ref 30.0–36.0)
MCV: 85.6 fL (ref 80.0–100.0)
Platelets: 288 10*3/uL (ref 150–400)
RBC: 4.5 MIL/uL (ref 3.87–5.11)
RDW: 13.9 % (ref 11.5–15.5)
WBC: 5.9 10*3/uL (ref 4.0–10.5)
nRBC: 0 % (ref 0.0–0.2)

## 2023-09-18 LAB — COMPREHENSIVE METABOLIC PANEL
ALT: 30 U/L (ref 0–44)
AST: 25 U/L (ref 15–41)
Albumin: 3.1 g/dL — ABNORMAL LOW (ref 3.5–5.0)
Alkaline Phosphatase: 75 U/L (ref 38–126)
Anion gap: 14 (ref 5–15)
BUN: 6 mg/dL (ref 6–20)
CO2: 24 mmol/L (ref 22–32)
Calcium: 8 mg/dL — ABNORMAL LOW (ref 8.9–10.3)
Chloride: 99 mmol/L (ref 98–111)
Creatinine, Ser: 0.59 mg/dL (ref 0.44–1.00)
GFR, Estimated: >60 mL/min (ref >60)
Glucose, Bld: 95 mg/dL (ref 70–99)
Potassium: 3.2 mmol/L — ABNORMAL LOW (ref 3.5–5.1)
Sodium: 137 mmol/L (ref 135–145)
Total Bilirubin: 0.4 mg/dL (ref 0.3–1.2)
Total Protein: 6.9 g/dL (ref 6.5–8.1)

## 2023-09-18 LAB — MAGNESIUM: Magnesium: 6 mg/dL — ABNORMAL HIGH (ref 1.7–2.4)

## 2023-09-18 MED ORDER — OXYCODONE HCL 5 MG PO TABS
5.0000 mg | ORAL_TABLET | ORAL | Status: DC | PRN
  Administered 2023-09-18 – 2023-09-20 (×4): 5 mg via ORAL
  Filled 2023-09-18 (×4): qty 1

## 2023-09-18 MED ORDER — AMLODIPINE BESYLATE 5 MG PO TABS
5.0000 mg | ORAL_TABLET | Freq: Every day | ORAL | Status: DC
  Administered 2023-09-18: 5 mg via ORAL
  Filled 2023-09-18: qty 1

## 2023-09-18 MED ORDER — DIPHENHYDRAMINE HCL 25 MG PO CAPS
25.0000 mg | ORAL_CAPSULE | Freq: Once | ORAL | Status: AC
  Administered 2023-09-18: 25 mg via ORAL
  Filled 2023-09-18: qty 1

## 2023-09-18 MED ORDER — PROCHLORPERAZINE EDISYLATE 10 MG/2ML IJ SOLN
10.0000 mg | Freq: Once | INTRAMUSCULAR | Status: AC
  Administered 2023-09-18: 10 mg via INTRAVENOUS
  Filled 2023-09-18: qty 2

## 2023-09-18 MED ORDER — METOCLOPRAMIDE HCL 10 MG PO TABS
10.0000 mg | ORAL_TABLET | Freq: Once | ORAL | Status: AC
  Administered 2023-09-18: 10 mg via ORAL
  Filled 2023-09-18: qty 1

## 2023-09-18 MED ORDER — ACETAMINOPHEN 500 MG PO TABS
1000.0000 mg | ORAL_TABLET | Freq: Four times a day (QID) | ORAL | Status: DC
  Administered 2023-09-18 – 2023-09-21 (×10): 1000 mg via ORAL
  Filled 2023-09-18 (×11): qty 2

## 2023-09-18 NOTE — Progress Notes (Addendum)
Post Partum Day 12  Subjective: Patient reports her headache has improved after the headache cocktail. She denies vision changes/nausea/vomiting/chest pain/shortness of breath.    Objective: Blood pressure (!) 135/94, pulse 86, temperature 98 F (36.7 C), temperature source Oral, resp. rate 17, height 5\' 6"  (1.676 m), weight 71.5 kg, SpO2 99%, currently breastfeeding.     09/18/2023   11:59 AM 09/18/2023    8:26 AM 09/18/2023    8:08 AM  Vitals with BMI  Systolic 135 154 657  Diastolic 94 97 102  Pulse 86 73 71    I/O last 3 completed shifts: In: 2693.8 [P.O.:1920; I.V.:773.8] Out: 2600 [Urine:2600]   Physical Exam:  General: cooperative and no distress, sleepy.  CVS: s1, s2, RRR. Pulmonary: Clear to auscultation bilaterally. Abdomen; Soft, non tender, non distended.  Lochia: appropriate. Uterine Fundus: firm. Incision: N/A. DVT Evaluation: No evidence of DVT seen on physical exam. No significant calf/ankle edema.  3+ patellar reflex bilaterally.   Results for orders placed or performed during the hospital encounter of 09/17/23 (from the past 24 hour(s))  Type and screen [facility]     Status: None   Collection Time: 09/17/23  8:05 PM  Result Value Ref Range   ABO/RH(D) O POS    Antibody Screen NEG    Sample Expiration      09/20/2023,2359 Performed at [facility], [address], [place], [place] [address]   Comprehensive metabolic panel     Status: Abnormal   Collection Time: 09/18/23  4:17 AM  Result Value Ref Range   Sodium 137 135 - 145 mmol/L   Potassium 3.2 (L) 3.5 - 5.1 mmol/L   Chloride 99 98 - 111 mmol/L   CO2 24 22 - 32 mmol/L   Glucose, Bld 95 70 - 99 mg/dL   BUN 6 6 - 20 mg/dL   Creatinine, Ser 2.95 0.44 - 1.00 mg/dL   Calcium 8.0 (L) 8.9 - 10.3 mg/dL   Total Protein 6.9 6.5 - 8.1 g/dL   Albumin 3.1 (L) 3.5 - 5.0 g/dL   AST 25 15 - 41 U/L   ALT 30 0 - 44 U/L   Alkaline Phosphatase 75 38 - 126 U/L   Total Bilirubin 0.4  0.3 - 1.2 mg/dL   GFR, Estimated >28 >41 mL/min   Anion gap 14 5 - 15  Magnesium     Status: Abnormal   Collection Time: 09/18/23  4:17 AM  Result Value Ref Range   Magnesium 6.0 (H) 1.7 - 2.4 mg/dL  CBC     Status: None   Collection Time: 09/18/23  4:17 AM  Result Value Ref Range   WBC 5.9 4.0 - 10.5 K/uL   RBC 4.50 3.87 - 5.11 MIL/uL   Hemoglobin 12.3 12.0 - 15.0 g/dL   HCT 32.4 40.1 - 02.7 %   MCV 85.6 80.0 - 100.0 fL   MCH 27.3 26.0 - 34.0 pg   MCHC 31.9 30.0 - 36.0 g/dL   RDW 25.3 66.4 - 40.3 %   Platelets 288 150 - 400 K/uL   nRBC 0.0 0.0 - 0.2 %    Narrative & Impression  CLINICAL DATA:  Severe headache and recent preeclampsia   EXAM: MRI HEAD WITHOUT AND WITH CONTRAST   MRA HEAD WITHOUT CONTRAST   TECHNIQUE: Multiplanar, multi-echo pulse sequences of the brain and surrounding structures were acquired without and with intravenous contrast. Angiographic images of the Circle of Willis were acquired using MRA technique without  intravenous contrast.   CONTRAST:  7mL GADAVIST GADOBUTROL 1 MMOL/ML IV SOLN   COMPARISON:  None Available.   FINDINGS: MRI HEAD FINDINGS   Brain: No acute infarct, mass effect or extra-axial collection. No acute or chronic hemorrhage. Normal white matter signal, parenchymal volume and CSF spaces. The midline structures are normal. There is no abnormal contrast enhancement.   Vascular: Normal flow voids.   Skull and upper cervical spine: Normal calvarium and skull base. Visualized upper cervical spine and soft tissues are normal.   Sinuses/Orbits:No paranasal sinus fluid levels or advanced mucosal thickening. No mastoid or middle ear effusion. Normal orbits.   MRA HEAD FINDINGS   POSTERIOR CIRCULATION:   --Vertebral arteries: Normal   --Inferior cerebellar arteries: Normal.   --Basilar artery: Normal.   --Superior cerebellar arteries: Normal.   --Posterior cerebral arteries: Normal.   ANTERIOR CIRCULATION:    --Intracranial internal carotid arteries: Normal.   --Anterior cerebral arteries (ACA): Normal.   --Middle cerebral arteries (MCA): Normal.   Anatomic variants: Fetal origin of the left PCA. Patent right P-comm.   IMPRESSION: Normal brain MRI and MRA.     Electronically Signed   By: Deatra Robinson M.D.   On: 09/17/2023 22:59   Narrative & Impression  CLINICAL DATA:  Shortness of breath.   EXAM: PORTABLE CHEST 1 VIEW   COMPARISON:  September 12, 2023   FINDINGS: The heart size and mediastinal contours are within normal limits. There is no evidence of acute infiltrate, pleural effusion or pneumothorax. The visualized skeletal structures are unremarkable.   IMPRESSION: No acute cardiopulmonary disease.     Electronically Signed   By: Aram Candela M.D.   On: 09/17/2023 20:50     Assessment/Plan:  37 y/o [id number] readmitted for postpartum preeclampsia with severe features, now PPD # 12 on Magnesium sulfate, stable, - Continue magnesium sulfate for total 24 hours to be stopped about 5 pm today.  - Oral labetalol and amlodipine restarted for blood pressure control. - Lovenox for DVT prophylaxis. -Headache management as needed.    LOS: 1 day   Prescilla Sours, MD 09/18/2023, 1:25 PM

## 2023-09-19 ENCOUNTER — Encounter (HOSPITAL_COMMUNITY): Payer: Self-pay | Admitting: Anesthesiology

## 2023-09-19 DIAGNOSIS — G43901 Migraine, unspecified, not intractable, with status migrainosus: Secondary | ICD-10-CM

## 2023-09-19 LAB — BASIC METABOLIC PANEL
Anion gap: 10 (ref 5–15)
BUN: 7 mg/dL (ref 6–20)
CO2: 24 mmol/L (ref 22–32)
Calcium: 8 mg/dL — ABNORMAL LOW (ref 8.9–10.3)
Chloride: 101 mmol/L (ref 98–111)
Creatinine, Ser: 0.7 mg/dL (ref 0.44–1.00)
GFR, Estimated: >60 mL/min (ref >60)
Glucose, Bld: 101 mg/dL — ABNORMAL HIGH (ref 70–99)
Potassium: 4.1 mmol/L (ref 3.5–5.1)
Sodium: 135 mmol/L (ref 135–145)

## 2023-09-19 MED ORDER — AMLODIPINE BESYLATE 5 MG PO TABS
10.0000 mg | ORAL_TABLET | Freq: Every day | ORAL | Status: DC
  Administered 2023-09-19 – 2023-09-20 (×2): 10 mg via ORAL
  Filled 2023-09-19 (×2): qty 2

## 2023-09-19 MED ORDER — COCONUT OIL OIL
1.0000 | TOPICAL_OIL | Status: DC | PRN
  Administered 2023-09-19 – 2023-09-20 (×2): 1 via TOPICAL

## 2023-09-19 MED ORDER — SUMATRIPTAN SUCCINATE 50 MG PO TABS
50.0000 mg | ORAL_TABLET | Freq: Two times a day (BID) | ORAL | Status: DC
  Filled 2023-09-19: qty 1

## 2023-09-19 MED ORDER — POTASSIUM CHLORIDE CRYS ER 20 MEQ PO TBCR
40.0000 meq | EXTENDED_RELEASE_TABLET | Freq: Two times a day (BID) | ORAL | Status: AC
  Administered 2023-09-19 (×2): 40 meq via ORAL
  Filled 2023-09-19 (×2): qty 2

## 2023-09-19 MED ORDER — TRIAMCINOLONE ACETONIDE 40 MG/ML IJ SUSP
Freq: Once | INTRAMUSCULAR | Status: DC
  Filled 2023-09-19: qty 10

## 2023-09-19 NOTE — Progress Notes (Signed)
  Progress Note   Patient: Katrina Blackwell WUJ:[id number] DOB: [birth year] DOA: 09/17/2023     2 DOS: the patient was seen and examined on 09/19/2023   Brief hospital course: Post Partum Day 13 s/p uncomplicated labor epidural (per note) on 10.19.24.  Subjective: Slight HA behind left eye with tenderness in the L temporal region.  Physical Exam: Vitals:   09/18/23 2337 09/19/23 0420 09/19/23 0422 09/19/23 0750  BP: (!) 144/94 (!) 160/96 (!) 144/95 (!) 142/88  Pulse: 74 71 73 71  Resp: 18  16 16   Temp: 36.9 C  36.9 C 37 C  TempSrc: Oral  Oral Oral  SpO2: 99%  98% 98%  Weight:      Height:       CV: RRR, no m/r/g Pulm: CTAB Vision: no visual field symptoms Ears: no hearing deficits/symptoms Ext: Motor and sensation intact  Imaging: MRI HEAD FINDINGS   Brain: No acute infarct, mass effect or extra-axial collection. No acute or chronic hemorrhage. Normal white matter signal, parenchymal volume and CSF spaces. The midline structures are normal. There is no abnormal contrast enhancement.   Vascular: Normal flow voids.   Skull and upper cervical spine: Normal calvarium and skull base. Visualized upper cervical spine and soft tissues are normal.   Sinuses/Orbits:No paranasal sinus fluid levels or advanced mucosal thickening. No mastoid or middle ear effusion. Normal orbits.  IMPRESSION: Normal brain MRI and MRA.   Assessment and Plan: 37 y/o F w/ h/o migraines s/p labor epidural on 09/07/23 presenting for evaluation of HA. Pt endorses non positional headache that started approx. 3-4 days after delivery that migrates from the L temporal region to behind her L eye. - Due to her history, nature of headache and interval of time from labor epidural placement, PDPH is under consideration (low likelihood) - Continue with conservative management at this time (ie. NSAIDs, Hydration, Bedrest, Caffeine). Most PDPH self resolve within 1-2 weeks. - Start migraine medications  when able. - If HA does not improve with conservative measures, adequate hydration and BP control, Epidural Blood Patch can be considered.  - Currently, patient able to perform daily activities and prefers conservative management over invasive management at this time based on risk/benefit profile.     Please notify us if her symptoms fail to improve despite conservative measures and BP goal being achieved.   Author: Shelton Silvas, MD 09/19/2023 10:28 AM

## 2023-09-19 NOTE — Consult Note (Addendum)
NEUROLOGY CONSULT NOTE   Date of service: September 19, 2023 Patient Name: Katrina Blackwell MRN:  [id number] DOB:  Apr 20, 1986 Chief Complaint: " Headache, high blood pressure" Requesting Provider: Osborn Coho, MD  History of Present Illness  Katrina Blackwell is a 37 y.o. right-handed woman with a past medical history significant for migraine headaches, gestational diabetes, recent delivery of her third baby (10/18, normal spontaneous vaginal delivery), preeclampsia  Patient reports that she first noticed her headache while in the recovery room after delivery on 10/18.  She did have elevated blood pressures which were treated with nifedipine and labetalol.  She did have an epidural at L3/L4.  Since delivery she has been taking sumatriptan daily, as well as Tylenol/ibuprofen every 4-6 hours.  Due to persistently elevated blood pressure she had increase in her antihypertensive medications.  Due to continuing worsening headache she was admitted on 10/29 and has been treated with IV magnesium.  MRI brain with and without contrast as well as MRA head were obtained and negative.  Anesthesia consultation was obtained and they felt her headache was not consistent with CSF leak, holding off on blood patch at this time.  Neurology was asked to consult in this setting  Patient does have a history of migraines, typically they happen approximately once a month or so.  Associated with light and sound sensitivity, worse with activity and better with rest 2.  Can last up to 2 or 3 days.  Prior to pregnancy was taking Nurtec (prescription cost was denied by her insurance so has been managing with occasional samples) as an abortive.  Since pregnancy had been taking sumatriptan hand again approximately once a month or so.  Treatments during this hospitalization have included Tylenol, Fioricet, oxycodone and sumatriptan   ROS  Negative unless otherwise noted --she has been having some urinary  incontinence since delivery.  She is been feeling her vision is mildly blurry when she first wakes up for 1 to 2 seconds.  No fevers.  No rashes or skin changes.  She is breast and bottlefeeding  Past History   Past Medical History:  Diagnosis Date   Abnormal glucose tolerance test 12/31/2017   Gestational diabetes    Headache(784.0)    MIGRAINES;OTC   Language barrier 05/12/2013   Speaks english, declines interpreter.    Migraines 05/11/2013    Past Surgical History:  Procedure Laterality Date   IR EMBO VENOUS NOT HEMORR HEMANG  INC GUIDE ROADMAPPING  02/07/2022   IR RADIOLOGIST EVAL & MGMT  01/01/2022   IR RADIOLOGIST EVAL & MGMT  03/09/2022   IR US GUIDE VASC ACCESS RIGHT  02/07/2022   IR VENOGRAM RENAL UNI LEFT  02/07/2022   TONSILLECTOMY  2009    Family History: Family History  Problem Relation Age of Onset   Stroke Mother    Asthma Mother    Hyperlipidemia Mother    Diabetes Father    Heart disease Father    Other Neg Hx     Social History  reports that she has quit smoking. Her smoking use included cigarettes. She has never used smokeless tobacco. She reports that she does not drink alcohol and does not use drugs.  No Known Allergies  Medications   Current Facility-Administered Medications:    acetaminophen (TYLENOL) tablet 1,000 mg, 1,000 mg, Oral, Q6H, Kulwa, Ema, MD, 1,000 mg at 09/19/23 1006   amLODipine (NORVASC) tablet 10 mg, 10 mg, Oral, Daily, Sallye Ober, Ema, MD, 10 mg at 09/19/23 1005  butalbital-acetaminophen-caffeine (FIORICET) 50-325-40 MG per tablet 1 tablet, 1 tablet, Oral, Q6H PRN, Osborn Coho, MD, 1 tablet at 09/19/23 2951   calcium carbonate (TUMS - dosed in mg elemental calcium) chewable tablet 400 mg of elemental calcium, 2 tablet, Oral, Q4H PRN, Osborn Coho, MD   coconut oil, 1 Application, Topical, PRN, Osborn Coho, MD, 1 Application at 09/19/23 0557   docusate sodium (COLACE) capsule 100 mg, 100 mg, Oral, Daily, Osborn Coho, MD,  100 mg at 09/19/23 1005   enoxaparin (LOVENOX) injection 40 mg, 40 mg, Subcutaneous, Q24H, Osborn Coho, MD, 40 mg at 09/19/23 0006   labetalol (NORMODYNE) injection 20 mg, 20 mg, Intravenous, PRN, 20 mg at 09/17/23 2313 **AND** labetalol (NORMODYNE) injection 40 mg, 40 mg, Intravenous, PRN, 40 mg at 09/17/23 2328 **AND** labetalol (NORMODYNE) injection 80 mg, 80 mg, Intravenous, PRN, 80 mg at 09/17/23 2347 **AND** hydrALAZINE (APRESOLINE) injection 10 mg, 10 mg, Intravenous, PRN **AND** Measure blood pressure, , , Once, Osborn Coho, MD   labetalol (NORMODYNE) tablet 200 mg, 200 mg, Oral, Q12H, Osborn Coho, MD, 200 mg at 09/19/23 1005   oxyCODONE (Oxy IR/ROXICODONE) immediate release tablet 5 mg, 5 mg, Oral, Q4H PRN, Osborn Coho, MD, 5 mg at 09/18/23 1751   prenatal multivitamin tablet 1 tablet, 1 tablet, Oral, Q1200, Osborn Coho, MD, 1 tablet at 09/19/23 1236   SUMAtriptan (IMITREX) tablet 50 mg, 50 mg, Oral, BID, Hoover Browns, MD  Vitals   Vitals:   09/19/23 0420 09/19/23 0422 09/19/23 0750 09/19/23 1145  BP: (!) 160/96 (!) 144/95 (!) 142/88 127/75  Pulse: 71 73 71 97  Resp:  16 16 16   Temp:  98.5 F (36.9 C) 98.6 F (37 C) 98.8 F (37.1 C)  TempSrc:  Oral Oral Oral  SpO2:  98% 98% 97%  Weight:      Height:        Body mass index is 25.45 kg/m.  Physical Exam   Constitutional: Appears well-developed and well-nourished --fatigued Psych: Affect appropriate to situation, pleasant and cooperative Eyes: No scleral injection.  HENT: No OP obstruction.  Head: Normocephalic.  Cardiovascular: Perfusing extremities well Respiratory: Effort normal, non-labored breathing.  GI: Soft.  No distension. There is no tenderness.  Skin: WDI.   Neurologic Examination   Neuro: Mental Status: Patient is awake, alert, oriented to person, place, month, year, and situation. Patient is able to give a clear and coherent history. No signs of aphasia or neglect Cranial Nerves: II:  Visual Fields are full. Pupils are equal, round, and reactive to light.  Funduscopic exam attempted but poorly tolerated secondary to photophobia which in combination with pupillary constriction made fundi difficult to visualize III,IV, VI: EOMI without ptosis, some mild blurring of her vision on upgaze V: Facial sensation is symmetric to temperature bilaterally VII: Facial movement is symmetric.  VIII: hearing is intact to voice and tuning fork X: Uvula elevates symmetrically XI: Shoulder shrug is symmetric. XII: tongue is midline without atrophy or fasciculations.  Motor: Tone is normal. Bulk is normal. 5/5 strength was present in all four extremities.  Sensory: Sensation is symmetric to light touch and temperature in the arms and legs. Deep Tendon Reflexes: 2+ and symmetric in the biceps and patellae.  Cerebellar: FNF and HKS are intact bilaterally Gait: Worsening of her headache from 6/10 to 7/10 with ambulation.  Able to rise on heels and toes.  Tandem gait intact.  Normal casual gait   Labs   CBC:  Recent Labs  Lab 09/17/23 1557 09/18/23  0417  WBC 4.8 5.9  HGB 11.7* 12.3  HCT 36.9 38.5  MCV 86.2 85.6  PLT 298 288    Basic Metabolic Panel:  Lab Results  Component Value Date   NA 135 09/19/2023   K 4.1 09/19/2023   CO2 24 09/19/2023   GLUCOSE 101 (H) 09/19/2023   BUN 7 09/19/2023   CREATININE 0.70 09/19/2023   CALCIUM 8.0 (L) 09/19/2023   GFRNONAA >60 09/19/2023   GFRAA >60 10/21/2019   Lipid Panel: No results found for: "LDLCALC" HgbA1c:  Lab Results  Component Value Date   HGBA1C 4.8 03/11/2017   Urine Drug Screen:     Component Value Date/Time   LABOPIA NONE DETECTED 11/15/2018 1459   COCAINSCRNUR NONE DETECTED 11/15/2018 1459   LABBENZ NONE DETECTED 11/15/2018 1459   AMPHETMU NONE DETECTED 11/15/2018 1459   THCU NONE DETECTED 11/15/2018 1459   LABBARB NONE DETECTED 11/15/2018 1459    Alcohol Level     Component Value Date/Time   ETH <10  11/15/2018 1459   INR  Lab Results  Component Value Date   INR 1.0 02/07/2022    MR Angio head without contrast (Personally reviewed): Normal  MRI Brain with and without contrast (Personally reviewed): Normal   Impression   Katrina Blackwell is a 37 y.o. female with status migrainosus.  Suspect severe migraine triggered in the setting of sleep disruption with newborn at home as well as triggered by her preeclampsia.  She has also had excessive use of abortive agents since delivery and may be developing a component of medication overuse headache.  Recommendations  -Patient considering occipital nerve block, will attempt inpatient if emergent consult volume allows otherwise will need to be completed outpatient  0.5% bupivacaine 10 mL per side Triamcinolone 40 mg per side  Addendum: Patient declined procedure at this time -Sumatriptan order discontinued; patient counseled to discontinue any further sumatriptan for at least 1 month -Encouraged to use nonpharmacological measures including ice packs, normalizing sleep cycle is much as possible, -Discussed the risks of Nurtec in lactating women, specifically relative infant exposure is 0.5% -- limited data but given very low level of exposure would be reasonable to use this if she felt comfortable -Ambulatory referral to neurology ______________________________________________________________________   Nunzio Cory MD-PhD [facility] [phone]  Available 7 AM to 7 PM, outside these hours please contact Neurologist on call listed on AMION   Greater than 80 minutes spent in care of the patient, majority at bedside

## 2023-09-19 NOTE — Progress Notes (Signed)
Post Partum Day 13 Subjective: Patient reports continued headache, 8/10 intensity, sharp, located left frontal area.  Previous medication given temporarily eases pain but the pain does not fully go away. Headache seems to worsen when she sits up.   Objective: Blood pressure 127/75, pulse 97, temperature 98.8 F (37.1 C), temperature source Oral, resp. rate 16, height 5\' 6"  (1.676 m), weight 71.5 kg, SpO2 97%, currently breastfeeding.    09/19/2023   11:45 AM 09/19/2023    7:50 AM 09/19/2023    4:22 AM  Vitals with BMI  Systolic 127 142 161  Diastolic 75 88 95  Pulse 97 71 73    Physical Exam:  General: alert, cooperative, and moderate distress. CVS: s1, s2, RRR Pulmonary: Clear to auscultation bilaterally. Abdomen: Soft, non tender, non distended.  Lochia: appropriate. Uterine Fundus: firm. Incision: N/A DVT Evaluation: No evidence of DVT seen on physical exam. No significant calf/ankle edema. 2+ patellar reflex bilaterally.    Recent Labs    09/17/23 1557 09/18/23 0417  HGB 11.7* 12.3  HCT 36.9 38.5    Assessment/Plan: 37 y/o [id number] readmitted for postpartum preeclampsia with severe features, now PPD # 13, s/p Magnesium sulfate, with persistent headache, unrelieved by multiple pain medication offered,  - Will consult with anesthesiology and neurology for further headache management.  - C/w oral labetalol and amlodipine for blood pressure control. - Lovenox for DVT prophylaxis.   LOS: 2 days   Prescilla Sours, MD 09/19/2023, 12:45 PM

## 2023-09-20 ENCOUNTER — Other Ambulatory Visit (HOSPITAL_COMMUNITY): Payer: Self-pay

## 2023-09-20 MED ORDER — LABETALOL HCL 200 MG PO TABS
300.0000 mg | ORAL_TABLET | Freq: Two times a day (BID) | ORAL | Status: DC
  Administered 2023-09-20 (×2): 300 mg via ORAL
  Filled 2023-09-20 (×2): qty 1

## 2023-09-20 MED ORDER — MAGNESIUM OXIDE -MG SUPPLEMENT 400 (240 MG) MG PO TABS
400.0000 mg | ORAL_TABLET | Freq: Two times a day (BID) | ORAL | Status: DC
  Administered 2023-09-20 (×2): 400 mg via ORAL
  Filled 2023-09-20 (×2): qty 1

## 2023-09-20 MED ORDER — RIMEGEPANT SULFATE 75 MG PO TBDP
75.0000 mg | ORAL_TABLET | Freq: Every day | ORAL | Status: DC | PRN
  Administered 2023-09-20: 75 mg via ORAL
  Filled 2023-09-20 (×2): qty 1

## 2023-09-20 NOTE — Progress Notes (Signed)
  S:   Reports feeling better, rates HA 4/10. Discussed plan to start magnesium oxide and Nurtec Tolerating PO fluid and solids No nausea or vomiting Denies visual changes and RUQ pain  O:   VS: BP 106/69 (BP Location: Left Arm)   Pulse 88   Temp 98.4 F (36.9 C) (Oral)   Resp 18   Ht 5\' 6"  (1.676 m)   Wt 71.5 kg   SpO2 98%   Breastfeeding Yes Comment: baby is ok  BMI 25.45 kg/m   LABS:  Recent Labs    09/17/23 1557 09/18/23 0417  WBC 4.8 5.9  HGB 11.7* 12.3  PLT 298 288   Blood type: --/--/O POS (10/29 2005) Rubella: Immune (04/18 0000)                      I&O: Intake/Output      10/31 0701 11/01 0700 11/01 0701 11/02 0700   P.O. 480    I.V. (mL/kg)     Total Intake(mL/kg) 480 (6.7)    Urine (mL/kg/hr) 1000 (0.6)    Total Output 1000    Net -520           Physical Exam: Alert and oriented X3 Lungs: Clear and unlabored Heart: regular rate and rhythm / no mumurs Abdomen: soft, non-tender, non-distended  Fundus: non-palpable Lochia: scant Extremities: no edema, negative for calf pain, tenderness, or cords    A:  PPD # 14 PP readmit for postpartum pre-eclampsia w/ severe features Intractable headache    -refractory to Imitrex and Fioricet   P:  Begin Nurtec and magnesium oxide Anticipate D/C on 09/21/23 Plan reviewed w/ Dr. Dr. Tresa Moore, DNP, CNM 09/20/2023, 1:57 PM

## 2023-09-21 DIAGNOSIS — R519 Headache, unspecified: Secondary | ICD-10-CM | POA: Diagnosis present

## 2023-09-21 DIAGNOSIS — Z8669 Personal history of other diseases of the nervous system and sense organs: Secondary | ICD-10-CM

## 2023-09-21 MED ORDER — NURTEC 75 MG PO TBDP: 75.0000 mg | ORAL_TABLET | Freq: Every day | ORAL | 1 refills | Status: AC | PRN

## 2023-09-21 MED ORDER — AMLODIPINE BESYLATE 10 MG PO TABS: 10.0000 mg | ORAL_TABLET | Freq: Every day | ORAL | 0 refills | Status: AC

## 2023-09-21 MED ORDER — MAGNESIUM OXIDE -MG SUPPLEMENT 400 (240 MG) MG PO TABS: 400.0000 mg | ORAL_TABLET | Freq: Two times a day (BID) | ORAL | 1 refills | Status: AC

## 2023-09-21 NOTE — Discharge Summary (Signed)
Postpartum Discharge Summary  Date of Service updated 09/21/23    Patient Name: Katrina Blackwell DOB: 1986-09-03 MRN: [id number]  Date of re-admission: 09/17/2023 Date of discharge: 09/21/2023  Admitting diagnosis: Pre-eclampsia in postpartum period [O14.95] Intrauterine pregnancy: Unknown     Secondary diagnosis:  Principal Problem:   Pre-eclampsia in postpartum period Active Problems:   Persistent headaches   Hx of migraine headaches  Additional problems: none    Discharge diagnosis: Preeclampsia (severe)                                              Hospital course: Readmitted to pre-eclampsia with severe features of headache and severe range blood pressures. Blood pressure responded to treatment with magnesium sulfate, amlodipine, and labetalol. Headache did not respond to fioricet and imitrex. MRI performed and neurology consult completed. Neurology impression and recommendations as follows. Headache/migraine did respond to Nurtec and pt to be discharged with Nurtec.  Impression    Katrina Blackwell is a 37 y.o. female with status migrainosus.  Suspect severe migraine triggered in the setting of sleep disruption with newborn at home as well as triggered by her preeclampsia.  She has also had excessive use of abortive agents since delivery and may be developing a component of medication overuse headache.   Recommendations  -Patient considering occipital nerve block, will attempt inpatient if emergent consult volume allows otherwise will need to be completed outpatient             0.5% bupivacaine 10 mL per side Triamcinolone 40 mg per side  Addendum: Patient declined procedure at this time -Sumatriptan order discontinued; patient counseled to discontinue any further sumatriptan for at least 1 month -Encouraged to use nonpharmacological measures including ice packs, normalizing sleep cycle is much as possible, -Discussed the risks of Nurtec in lactating women,  specifically relative infant exposure is 0.5% -- limited data but given very low level of exposure would be reasonable to use this if she felt comfortable -Ambulatory referral to neurology  Magnesium Sulfate received: Yes: Seizure prophylaxis  Immunization History  Administered Date(s) Administered   Influenza-Unspecified 08/08/2016   PFIZER(Purple Top)SARS-COV-2 Vaccination 03/12/2020, 04/09/2020   Tdap 05/14/2013    Physical exam  Vitals:   09/20/23 1553 09/20/23 1941 09/20/23 2339 09/21/23 0402  BP: (!) 122/92 121/74 (!) 134/90 97/63  Pulse: 95 (!) 103 86 99  Resp: 17 16 16 16   Temp: 98.1 F (36.7 C) 98.6 F (37 C) 98.6 F (37 C) 98.4 F (36.9 C)  TempSrc: Oral Oral Oral Oral  SpO2: 98% 97% 98% 99%  Weight:      Height:       General: alert, cooperative, and no distress Lochia: appropriate Uterine Fundus: no longer palpable DVT Evaluation: No evidence of DVT seen on physical exam. No cords or calf tenderness. No significant calf/ankle edema. Negative dopplers of lower extremities  Labs: Lab Results  Component Value Date   WBC 5.9 09/18/2023   HGB 12.3 09/18/2023   HCT 38.5 09/18/2023   MCV 85.6 09/18/2023   PLT 288 09/18/2023      Latest Ref Rng & Units 09/19/2023    7:35 AM  CMP  Glucose 70 - 99 mg/dL 409   BUN 6 - 20 mg/dL 7   Creatinine 8.11 - 9.14 mg/dL 7.82   Sodium 956 - 213 mmol/L  135   Potassium 3.5 - 5.1 mmol/L 4.1   Chloride 98 - 111 mmol/L 101   CO2 22 - 32 mmol/L 24   Calcium 8.9 - 10.3 mg/dL 8.0    Edinburgh Score:     After visit meds:  Allergies as of 09/21/2023   No Known Allergies      Medication List     STOP taking these medications    furosemide 20 MG tablet Commonly known as: Lasix   IRON PO   labetalol 200 MG tablet Commonly known as: NORMODYNE   senna-docusate 8.6-50 MG tablet Commonly known as: Senokot-S   SUMAtriptan 50 MG tablet Commonly known as: IMITREX       TAKE these medications     acetaminophen 325 MG tablet Commonly known as: Tylenol Take 2 tablets (650 mg total) by mouth every 4 (four) hours as needed (for pain scale < 4).   amLODipine 10 MG tablet Commonly known as: NORVASC Take 1 tablet (10 mg total) by mouth daily. What changed:  medication strength how much to take   calcium carbonate 500 MG chewable tablet Commonly known as: TUMS - dosed in mg elemental calcium Chew 2 tablets by mouth daily as needed for indigestion or heartburn.   ibuprofen 600 MG tablet Commonly known as: ADVIL Take 1 tablet (600 mg total) by mouth every 6 (six) hours.   magnesium oxide 400 (240 Mg) MG tablet Commonly known as: MAG-OX Take 1 tablet (400 mg total) by mouth 2 (two) times daily.   Nurtec 75 MG Tbdp Generic drug: Rimegepant Sulfate Take 1 tablet (75 mg total) by mouth daily as needed (migraine).   pantoprazole 20 MG tablet Commonly known as: PROTONIX Take 1 tablet (20 mg total) by mouth daily. What changed:  when to take this reasons to take this   prenatal multivitamin Tabs tablet Take 1 tablet by mouth daily.   Vitamin D 50 MCG (2000 UT) tablet Take 2,000 Units by mouth daily.   witch hazel-glycerin pad Commonly known as: TUCKS Apply 1 Application topically as needed for itching.         Discharge home in stable condition Discharge instruction: per After Visit Summary and Postpartum booklet. Activity: Advance as tolerated. Pelvic rest for 6 weeks.  Diet: low salt diet Postpartum Appointment:6 weeks Additional Postpartum F/U: BP check 2-3 days Future Appointments: Future Appointments  Date Time Provider Department Center  10/23/2023  9:20 AM Tobb, Lavona [place], DO [facility] None   Follow up Visit:  Follow-up Information     [facility]. Schedule an appointment as soon as possible for a visit in 1 week(s).   Specialty: Obstetrics and Gynecology Contact information: [address] [place] [address] [phone]                    09/21/2023 Roma Schanz, CNM

## 2023-09-23 ENCOUNTER — Encounter: Payer: Self-pay | Admitting: Neurology

## 2023-09-23 ENCOUNTER — Ambulatory Visit (INDEPENDENT_AMBULATORY_CARE_PROVIDER_SITE_OTHER): Payer: Medicaid Other | Admitting: Neurology

## 2023-09-23 VITALS — BP 108/70 | Ht 66.0 in | Wt 156.0 lb

## 2023-09-23 DIAGNOSIS — O1490 Unspecified pre-eclampsia, unspecified trimester: Secondary | ICD-10-CM

## 2023-09-23 DIAGNOSIS — G43709 Chronic migraine without aura, not intractable, without status migrainosus: Secondary | ICD-10-CM | POA: Diagnosis not present

## 2023-09-23 DIAGNOSIS — R519 Headache, unspecified: Secondary | ICD-10-CM

## 2023-09-23 MED ORDER — NURTEC 75 MG PO TBDP: ORAL_TABLET | ORAL | 11 refills | Status: DC

## 2023-09-23 MED ORDER — NURTEC 75 MG PO TBDP: ORAL_TABLET | ORAL | Status: AC

## 2023-09-23 NOTE — Progress Notes (Addendum)
Chief Complaint  Patient presents with   New Patient (Initial Visit)    Rm 14 /urgent internal hospital referral for migraines: migraines daily since giving birth 10/18, she is breast feeding, only taking tylenol, waiting on nurtec PA from ER, had preclampsia, hard to function      ASSESSMENT AND PLAN  Katrina Blackwell is a 37 y.o. female   Chronic migraine Worsening daily headache postpartum with blurry vision  Normal neurological examination, and MRI of the brain, MRA of brain  Ordered MRV of the brain to rule out venous thrombosis,  Refer to ophthalmology for evaluation,  Since she is breast-feeding, Tylenol as needed for moderate headaches, Imitrex does not works well, previous migraine responding to Nurtec, give her 2 samples also prescription,  We went over potential lactation related information about Nurtec,.   Available evidence and/or expert consensus is inconclusive or is inadequate for determining infant risk when used during breastfeeding.   a) Evidence from a lactation study suggests there is a low transfer of rimegepant into breast milk. Twelve healthy adult lactating women, who were between 2 and 6 months postpartum, were administered a single oral dose of rimegepant 75 mg in a lactation study. A relative infant dose of less than 1% of the maternal weight-adjusted dose and average milk plasma ratio of 0.2 was observed. There is no data on the effects of rimegepant on a breastfed infant or on milk production   If she decided to take Nurtec for severe headaches suggest her to discard milk for 24 hours,  DIAGNOSTIC DATA (LABS, IMAGING, TESTING) - I reviewed patient records, labs, notes, testing and imaging myself where available.  Ophthalmology evaluation by Dr. Lyn Records November 26, 2023, no papillary edema, MEDICAL HISTORY:  Katrina Blackwell, is a 37 year old female seen in request by her primary care doctor, Chow, Greig Castilla for evaluation of frequent  headaches, initial evaluation was on September 23, 2023  History is obtained from the patient and review of electronic medical records. I personally reviewed pertinent available imaging films in PACS.   PMHx of  Insulin controlled gestational diabetes mellitus Carrier of spinal muscular atrophy  She has long history of chronic migraine headaches, typical migraine are right retro-orbital area pressure sometimes throbbing pain with light noise sensitivity, nauseous, Imitrex, Nurtec as needed works well for her  She has 3 children at age 29, 37 and 2 weeks, had induced vaginal delivery through epidural September 06, 2023, she also had gestational diabetes, require titrating dose of insulin,  Positive delivery, she began to noticed different kind of headaches, bilateral temporal frontal region constant pressure, described 7 out of 10, sometimes can go up to 10 out of 10, Imitrex does not work as well, did not get to try Nurtec yet, ran out of previous prescription  She also developed elevated blood pressure, under close supervision, MRI of the brain with and without contrast September 17, 2023 was normal, MRA of brain was normal  She also complains of blurry vision, especially during headache  She is still breast-feeding, but began to adding on formula,  We went over potential lactation related information about Nurtec,.   Available evidence and/or expert consensus is inconclusive or is inadequate for determining infant risk when used during breastfeeding.   a) Evidence from a lactation study suggests there is a low transfer of rimegepant into breast milk. Twelve healthy adult lactating women, who were between 2 and 6 months postpartum, were administered a single oral dose of  rimegepant 75 mg in a lactation study. A relative infant dose of less than 1% of the maternal weight-adjusted dose and average milk plasma ratio of 0.2 was observed. There is no data on the effects of rimegepant on a breastfed  infant or on milk production      PHYSICAL EXAM:   Vitals:   09/23/23 1335  BP: 108/70  Weight: 156 lb (70.8 kg)  Height: 5\' 6"  (1.676 m)     Body mass index is 25.18 kg/m.  PHYSICAL EXAMNIATION:  Gen: NAD, conversant, well nourised, well groomed                     Cardiovascular: Regular rate rhythm, no peripheral edema, warm, nontender. Eyes: Conjunctivae clear without exudates or hemorrhage Neck: Supple, no carotid bruits. Pulmonary: Clear to auscultation bilaterally   NEUROLOGICAL EXAM:  MENTAL STATUS: Speech/cognition: Awake, alert, oriented to history taking and casual conversation CRANIAL NERVES: CN II: Visual fields are full to confrontation. Pupils are round equal and briskly reactive to light. CN III, IV, VI: extraocular movement are normal. No ptosis. CN V: Facial sensation is intact to light touch CN VII: Face is symmetric with normal eye closure  CN VIII: Hearing is normal to causal conversation. CN IX, X: Phonation is normal. CN XI: Head turning and shoulder shrug are intact  MOTOR: There is no pronator drift of out-stretched arms. Muscle bulk and tone are normal. Muscle strength is normal.  REFLEXES: Reflexes are 2+ and symmetric at the biceps, triceps, knees, and ankles. Plantar responses are flexor.  SENSORY: Intact to light touch, pinprick and vibratory sensation are intact in fingers and toes.  COORDINATION: There is no trunk or limb dysmetria noted.  GAIT/STANCE: Posture is normal. Gait is steady with normal steps, base, arm swing, and turning. Heel and toe walking are normal. Tandem gait is normal.  Romberg is absent.  REVIEW OF SYSTEMS:  Full 14 system review of systems performed and notable only for as above All other review of systems were negative.   ALLERGIES: No Known Allergies  HOME MEDICATIONS: Current Outpatient Medications  Medication Sig Dispense Refill   acetaminophen (TYLENOL) 325 MG tablet Take 2 tablets (650 mg  total) by mouth every 4 (four) hours as needed (for pain scale < 4).     amLODipine (NORVASC) 10 MG tablet Take 1 tablet (10 mg total) by mouth daily. 30 tablet 0   magnesium oxide (MAG-OX) 400 (240 Mg) MG tablet Take 1 tablet (400 mg total) by mouth 2 (two) times daily. 60 tablet 1   Prenatal Vit-Fe Fumarate-FA (PRENATAL MULTIVITAMIN) TABS tablet Take 1 tablet by mouth daily.     witch hazel-glycerin (TUCKS) pad Apply 1 Application topically as needed for itching. 40 each 12   calcium carbonate (TUMS - DOSED IN MG ELEMENTAL CALCIUM) 500 MG chewable tablet Chew 2 tablets by mouth daily as needed for indigestion or heartburn. (Patient not taking: Reported on 09/23/2023)     Cholecalciferol (VITAMIN D) 50 MCG (2000 UT) tablet Take 2,000 Units by mouth daily. (Patient not taking: Reported on 09/23/2023)     ibuprofen (ADVIL) 600 MG tablet Take 1 tablet (600 mg total) by mouth every 6 (six) hours. (Patient not taking: Reported on 09/23/2023) 30 tablet 0   pantoprazole (PROTONIX) 20 MG tablet Take 1 tablet (20 mg total) by mouth daily. (Patient not taking: Reported on 09/23/2023) 30 tablet 1   Rimegepant Sulfate (NURTEC) 75 MG TBDP Take 1 tablet (75 mg total)  by mouth daily as needed (migraine). (Patient not taking: Reported on 09/23/2023) 30 tablet 1   No current facility-administered medications for this visit.    PAST MEDICAL HISTORY: Past Medical History:  Diagnosis Date   Abnormal glucose tolerance test 12/31/2017   Gestational diabetes    Headache(784.0)    MIGRAINES;OTC   Language barrier 05/12/2013   Speaks english, declines interpreter.    Migraines 05/11/2013    PAST SURGICAL HISTORY: Past Surgical History:  Procedure Laterality Date   IR EMBO VENOUS NOT HEMORR HEMANG  INC GUIDE ROADMAPPING  02/07/2022   IR RADIOLOGIST EVAL & MGMT  01/01/2022   IR RADIOLOGIST EVAL & MGMT  03/09/2022   IR US GUIDE VASC ACCESS RIGHT  02/07/2022   IR VENOGRAM RENAL UNI LEFT  02/07/2022   TONSILLECTOMY  2009     FAMILY HISTORY: Family History  Problem Relation Age of Onset   Stroke Mother    Asthma Mother    Hyperlipidemia Mother    Diabetes Father    Heart disease Father    Other Neg Hx     SOCIAL HISTORY: Social History   Socioeconomic History   Marital status: Married    Spouse name: [name]   Number of children: Not on file   Years of education: 16   Highest education level: Not on file  Occupational History   Occupation: HOMEMAKER-STUDENT  Tobacco Use   Smoking status: Former    Types: Cigarettes   Smokeless tobacco: Never  Vaping Use   Vaping status: Never Used  Substance and Sexual Activity   Alcohol use: No   Drug use: No   Sexual activity: Not Currently    Partners: Male  Other Topics Concern   Not on file  Social History Narrative   Right married   Married   Caffiene 1 cup daily   Social Determinants of Health   Financial Resource Strain: Not on file  Food Insecurity: No Food Insecurity (09/17/2023)   Hunger Vital Sign    Worried About Running Out of Food in the Last Year: Never true    Ran Out of Food in the Last Year: Never true  Transportation Needs: No Transportation Needs (09/17/2023)   PRAPARE - Administrator, Civil Service (Medical): No    Lack of Transportation (Non-Medical): No  Physical Activity: Not on file  Stress: Not on file  Social Connections: Not on file  Intimate Partner Violence: Not At Risk (09/17/2023)   Humiliation, Afraid, Rape, and Kick questionnaire    Fear of Current or Ex-Partner: No    Emotionally Abused: No    Physically Abused: No    Sexually Abused: No      Levert Feinstein, M.D. Ph.D.  [facility] [address], [address] [place], [place] [address] Ph: [phone] Fax: [phone]  CC:  Gordy Councilman, MD [address] [place],  [place] [address]  Chow, Zannie Cove, MD

## 2023-09-26 ENCOUNTER — Telehealth: Payer: Self-pay | Admitting: Neurology

## 2023-09-26 NOTE — Telephone Encounter (Signed)
Referral for ophthalmology fax to [facility]. Phone: [phone], Fax: [phone]. 

## 2023-09-26 NOTE — Telephone Encounter (Signed)
[facility] Katrina Blackwell: [id number] exp. 09/26/23-11/25/23 sent to GI [phone]

## 2023-09-28 ENCOUNTER — Telehealth (HOSPITAL_COMMUNITY): Payer: Self-pay

## 2023-09-28 NOTE — Telephone Encounter (Signed)
09/28/2023 1440  Name: Katrina Blackwell MRN: [id number] DOB: August 09, 1986  Reason for Call:  Transition of Care Hospital Discharge Call  Contact Status:    Language assistant needed: Interpreter Mode: Interpreter Not Needed        Follow-Up Questions: Do You Have Any Concerns About Your Health As You Heal From Delivery?: Yes What Concerns Do You Have About Your Health?: Patient reports that she is still having a headache. RN reviewed preeclampsia and warning signs. Patient states that she is monitoring her BP at home and that her doctor is aware of her headache and BP readings. RN told patient to contact her OB with preeclampsia warning signs or high blood pressure readings. Patient states that she has an appointment scheduled with her doctor soon. Patient declines any other questions or concerns about her health or healing. Do You Have Any Concerns About Your Infants Health?: No  Edinburgh Postnatal Depression Scale:  In the Past 7 Days:    PHQ2-9 Depression Scale:     Discharge Follow-up: Edinburgh score requires follow up?:  (Patient reports that she is doing ok emotionally. Patient states that she does not want to do EPDS over the phone that she would like to read it. She states that she has a nurse home visit coming up and an appointment with her doctor.) Patient was advised of the following resources:: Breastfeeding Support Group, Support Group Patient referred to:: OB  Post-discharge interventions: Reviewed Newborn Safe Sleep Practices  Signature  Signe Colt

## 2023-10-02 DIAGNOSIS — F53 Postpartum depression: Secondary | ICD-10-CM | POA: Diagnosis not present

## 2023-10-04 ENCOUNTER — Encounter: Payer: Self-pay | Admitting: Neurology

## 2023-10-14 ENCOUNTER — Ambulatory Visit
Admission: RE | Admit: 2023-10-14 | Discharge: 2023-10-14 | Disposition: A | Discharge status: Home / Self Care | Payer: Medicaid Other | Source: Ambulatory Visit | Attending: Neurology | Admitting: Neurology

## 2023-10-14 DIAGNOSIS — O1490 Unspecified pre-eclampsia, unspecified trimester: Secondary | ICD-10-CM

## 2023-10-14 DIAGNOSIS — G43709 Chronic migraine without aura, not intractable, without status migrainosus: Secondary | ICD-10-CM

## 2023-10-14 DIAGNOSIS — R519 Headache, unspecified: Secondary | ICD-10-CM | POA: Diagnosis not present

## 2023-10-21 ENCOUNTER — Telehealth: Payer: Self-pay | Admitting: Neurology

## 2023-10-21 NOTE — Telephone Encounter (Signed)
Pt is asking to be called so she can be scheduled for a nerve block.

## 2023-10-23 ENCOUNTER — Telehealth: Payer: Self-pay

## 2023-10-23 ENCOUNTER — Ambulatory Visit: Payer: Medicaid Other | Attending: Cardiology | Admitting: Cardiology

## 2023-10-23 ENCOUNTER — Encounter: Payer: Self-pay | Admitting: Cardiology

## 2023-10-23 VITALS — BP 137/102 | HR 87 | Ht 66.0 in | Wt 152.0 lb

## 2023-10-23 DIAGNOSIS — Z3A00 Weeks of gestation of pregnancy not specified: Secondary | ICD-10-CM

## 2023-10-23 DIAGNOSIS — O165 Unspecified maternal hypertension, complicating the puerperium: Secondary | ICD-10-CM

## 2023-10-23 NOTE — Progress Notes (Unsigned)
Cardio-Obstetrics Clinic  Follow Up Note   Date:  10/24/2023   ID:  Katrina Blackwell, DOB 1985/11/25, MRN [id number]  PCP:  Penelope Galas, MD   [facility] Providers Cardiologist:  Thomasene Ripple, DO  Electrophysiologist:  None        Referring MD: No ref. provider found   Chief Complaint: " I am ok"  History of Present Illness:    Katrina Blackwell is a 37 y.o. female [[id number]] who returns for follow up of for post partum cardiovascular care.  Gestational diabetes was noticed during this pregnancy.  She has been started on, antihypertensive in the postpartum period for postpartum hypertension.  She is currently on amlodipine and labetalol.      Prior CV Studies Reviewed: The following studies were reviewed today: Echo reviewed   Past Medical History:  Diagnosis Date   Abnormal glucose tolerance test 12/31/2017   Gestational diabetes    Headache(784.0)    MIGRAINES;OTC   Language barrier 05/12/2013   Speaks english, declines interpreter.    Migraines 05/11/2013    Past Surgical History:  Procedure Laterality Date   IR EMBO VENOUS NOT HEMORR HEMANG  INC GUIDE ROADMAPPING  02/07/2022   IR RADIOLOGIST EVAL & MGMT  01/01/2022   IR RADIOLOGIST EVAL & MGMT  03/09/2022   IR US GUIDE VASC ACCESS RIGHT  02/07/2022   IR VENOGRAM RENAL UNI LEFT  02/07/2022   TONSILLECTOMY  2009      OB History     Gravida  3   Para  3   Term  3   Preterm      AB      Living  3      SAB      IAB      Ectopic      Multiple  0   Live Births  3               Current Medications: Current Meds  Medication Sig   acetaminophen (TYLENOL) 325 MG tablet Take 2 tablets (650 mg total) by mouth every 4 (four) hours as needed (for pain scale < 4).   Cholecalciferol (VITAMIN D) 50 MCG (2000 UT) tablet Take 2,000 Units by mouth daily.   magnesium oxide (MAG-OX) 400 (240 Mg) MG tablet Take 1 tablet (400 mg total) by mouth 2 (two) times daily.    pantoprazole (PROTONIX) 20 MG tablet Take 1 tablet (20 mg total) by mouth daily.   Prenatal Vit-Fe Fumarate-FA (PRENATAL MULTIVITAMIN) TABS tablet Take 1 tablet by mouth daily.   Rimegepant Sulfate (NURTEC) 75 MG TBDP Take 1 tab at onset of migraine.  May repeat in 2 hrs, if needed.  Max dose: 2 tabs/day. This is a 30 day prescription.   witch hazel-glycerin (TUCKS) pad Apply 1 Application topically as needed for itching.     Allergies:   Patient has no known allergies.   Social History   Socioeconomic History   Marital status: Married    Spouse name: Katrina Blackwell   Number of children: Not on file   Years of education: 16   Highest education level: Not on file  Occupational History   Occupation: HOMEMAKER-STUDENT  Tobacco Use   Smoking status: Former    Types: Cigarettes   Smokeless tobacco: Never  Vaping Use   Vaping status: Never Used  Substance and Sexual Activity   Alcohol use: No   Drug use: No   Sexual activity: Not Currently  Partners: Male  Other Topics Concern   Not on file  Social History Narrative   Right married   Married   Caffiene 1 cup daily   Social Determinants of Health   Financial Resource Strain: Not on file  Food Insecurity: No Food Insecurity (09/17/2023)   Hunger Vital Sign    Worried About Running Out of Food in the Last Year: Never true    Ran Out of Food in the Last Year: Never true  Transportation Needs: No Transportation Needs (09/17/2023)   PRAPARE - Administrator, Civil Service (Medical): No    Lack of Transportation (Non-Medical): No  Physical Activity: Not on file  Stress: Not on file  Social Connections: Not on file      Family History  Problem Relation Age of Onset   Stroke Mother    Asthma Mother    Hyperlipidemia Mother    Diabetes Father    Heart disease Father    Other Neg Hx       ROS:   Please see the history of present illness.     All other systems reviewed and are negative.   Labs/EKG Reviewed:     EKG:   EKG was not ordered today.    Recent Labs: 09/18/2023: ALT 30; Hemoglobin 12.3; Magnesium 6.0; Platelets 288 09/19/2023: BUN 7; Creatinine, Ser 0.70; Potassium 4.1; Sodium 135   Recent Lipid Panel No results found for: "CHOL", "TRIG", "HDL", "CHOLHDL", "LDLCALC", "LDLDIRECT"  Physical Exam:    VS:  BP (!) 137/102 (BP Location: Left Arm, Patient Position: Sitting, Cuff Size: Normal)   Pulse 87   Ht 5\' 6"  (1.676 m)   Wt 152 lb (68.9 kg)   SpO2 99%   BMI 24.53 kg/m     Wt Readings from Last 3 Encounters:  10/23/23 152 lb (68.9 kg)  09/23/23 156 lb (70.8 kg)  09/17/23 157 lb 11.2 oz (71.5 kg)       Risk Assessment/Risk Calculators:                 ASSESSMENT & PLAN:    Postpartum hypertension Elevated blood pressure despite current treatment with Amlodipine 10 mg daily and labetalol 200 mg twice daily was recently added. No reported leg swelling. -Request patient to monitor blood pressure for the next week and send readings on 10/30/2023 for review. -Consider medication adjustment based on reported readings. -Schedule in-person office visit in six weeks to monitor blood pressure.  Patient Instructions  Medication Instructions:  Your physician recommends that you continue on your current medications as directed. Please refer to the Current Medication list given to you today.  *If you need a refill on your cardiac medications before your next appointment, please call your pharmacy*   Follow-Up: At [facility], you and your health needs are our priority.  As part of our continuing mission to provide you with exceptional heart care, we have created designated Provider Care Teams.  These Care Teams include your primary Cardiologist (physician) and Advanced Practice Providers (APPs -  Physician Assistants and Nurse Practitioners) who all work together to provide you with the care you need, when you need it.  Your next appointment:   6  week(s)  Provider:   Thomasene Ripple, DO     Other Instructions Please take your blood pressure daily for 1 week and send in a MyChart message. Please include heart rates. (One message at the end of the 1 week).   HOW TO TAKE YOUR  BLOOD PRESSURE: Rest 5 minutes before taking your blood pressure. Don't smoke or drink caffeinated beverages for at least 30 minutes before. Take your blood pressure before (not after) you eat. Sit comfortably with your back supported and both feet on the floor (don't cross your legs). Elevate your arm to heart level on a table or a desk. Use the proper sized cuff. It should fit smoothly and snugly around your bare upper arm. There should be enough room to slip a fingertip under the cuff. The bottom edge of the cuff should be 1 inch above the crease of the elbow. Ideally, take 3 measurements at one sitting and record the average.     Dispo:  No follow-ups on file.   Medication Adjustments/Labs and Tests Ordered: Current medicines are reviewed at length with the patient today.  Concerns regarding medicines are outlined above.  Tests Ordered: No orders of the defined types were placed in this encounter.  Medication Changes: No orders of the defined types were placed in this encounter.

## 2023-10-23 NOTE — Telephone Encounter (Signed)
Called pt to go over AVS and set up 6 week appointment (Jan 13th-17th). No answer at this time, left message for pt to return call.

## 2023-10-23 NOTE — Patient Instructions (Signed)
Medication Instructions:  Your physician recommends that you continue on your current medications as directed. Please refer to the Current Medication list given to you today.  *If you need a refill on your cardiac medications before your next appointment, please call your pharmacy*   Follow-Up: At [facility], you and your health needs are our priority.  As part of our continuing mission to provide you with exceptional heart care, we have created designated Provider Care Teams.  These Care Teams include your primary Cardiologist (physician) and Advanced Practice Providers (APPs -  Physician Assistants and Nurse Practitioners) who all work together to provide you with the care you need, when you need it.  Your next appointment:   6 week(s)  Provider:   Thomasene Ripple, DO     Other Instructions Please take your blood pressure daily for 1 week and send in a MyChart message. Please include heart rates. (One message at the end of the 1 week).   HOW TO TAKE YOUR BLOOD PRESSURE: Rest 5 minutes before taking your blood pressure. Don't smoke or drink caffeinated beverages for at least 30 minutes before. Take your blood pressure before (not after) you eat. Sit comfortably with your back supported and both feet on the floor (don't cross your legs). Elevate your arm to heart level on a table or a desk. Use the proper sized cuff. It should fit smoothly and snugly around your bare upper arm. There should be enough room to slip a fingertip under the cuff. The bottom edge of the cuff should be 1 inch above the crease of the elbow. Ideally, take 3 measurements at one sitting and record the average.
